# Patient Record
Sex: Female | Born: 1965
Health system: Southern US, Community
[De-identification: ages and names within clinical notes are randomized; demographics above are authoritative.]

## PROBLEM LIST (undated history)

## (undated) DIAGNOSIS — H8102 Meniere's disease, left ear: Secondary | ICD-10-CM

## (undated) DIAGNOSIS — T7840XA Allergy, unspecified, initial encounter: Secondary | ICD-10-CM

## (undated) DIAGNOSIS — F32A Depression, unspecified: Secondary | ICD-10-CM

## (undated) DIAGNOSIS — F329 Major depressive disorder, single episode, unspecified: Secondary | ICD-10-CM

## (undated) DIAGNOSIS — I10 Essential (primary) hypertension: Secondary | ICD-10-CM

## (undated) DIAGNOSIS — M1990 Unspecified osteoarthritis, unspecified site: Secondary | ICD-10-CM

## (undated) DIAGNOSIS — L4050 Arthropathic psoriasis, unspecified: Secondary | ICD-10-CM

## (undated) DIAGNOSIS — F419 Anxiety disorder, unspecified: Secondary | ICD-10-CM

## (undated) DIAGNOSIS — A159 Respiratory tuberculosis unspecified: Secondary | ICD-10-CM

## (undated) HISTORY — DX: Respiratory tuberculosis unspecified: A15.9

## (undated) HISTORY — DX: Anxiety disorder, unspecified: F41.9

## (undated) HISTORY — DX: Major depressive disorder, single episode, unspecified: F32.9

## (undated) HISTORY — DX: Essential (primary) hypertension: I10

## (undated) HISTORY — DX: Unspecified osteoarthritis, unspecified site: M19.90

## (undated) HISTORY — DX: Arthropathic psoriasis, unspecified: L40.50

## (undated) HISTORY — DX: Allergy, unspecified, initial encounter: T78.40XA

## (undated) HISTORY — PX: COLONOSCOPY: SHX174

## (undated) HISTORY — DX: Meniere's disease, left ear: H81.02

## (undated) HISTORY — DX: Depression, unspecified: F32.A

---

## 1998-02-20 DIAGNOSIS — F909 Attention-deficit hyperactivity disorder, unspecified type: Secondary | ICD-10-CM

## 1998-02-20 HISTORY — DX: Attention-deficit hyperactivity disorder, unspecified type: F90.9

## 1999-02-24 ENCOUNTER — Other Ambulatory Visit: Admission: RE | Admit: 1999-02-24 | Discharge: 1999-02-24 | Payer: Self-pay | Admitting: Obstetrics and Gynecology

## 2000-03-29 ENCOUNTER — Other Ambulatory Visit: Admission: RE | Admit: 2000-03-29 | Discharge: 2000-03-29 | Payer: Self-pay | Admitting: Obstetrics and Gynecology

## 2000-06-06 ENCOUNTER — Encounter: Admission: RE | Admit: 2000-06-06 | Discharge: 2000-06-06 | Payer: Self-pay | Admitting: Occupational Medicine

## 2000-06-06 ENCOUNTER — Encounter: Payer: Self-pay | Admitting: Occupational Medicine

## 2000-08-30 ENCOUNTER — Emergency Department (HOSPITAL_COMMUNITY): Admission: EM | Admit: 2000-08-30 | Discharge: 2000-08-31 | Payer: Self-pay | Admitting: Emergency Medicine

## 2001-04-01 ENCOUNTER — Other Ambulatory Visit: Admission: RE | Admit: 2001-04-01 | Discharge: 2001-04-01 | Payer: Self-pay | Admitting: Obstetrics and Gynecology

## 2002-06-11 ENCOUNTER — Other Ambulatory Visit: Admission: RE | Admit: 2002-06-11 | Discharge: 2002-06-11 | Payer: Self-pay | Admitting: Obstetrics and Gynecology

## 2006-10-03 ENCOUNTER — Ambulatory Visit (HOSPITAL_COMMUNITY): Admission: RE | Admit: 2006-10-03 | Discharge: 2006-10-03 | Payer: Self-pay | Admitting: Obstetrics and Gynecology

## 2006-10-03 ENCOUNTER — Encounter (INDEPENDENT_AMBULATORY_CARE_PROVIDER_SITE_OTHER): Payer: Self-pay | Admitting: Obstetrics and Gynecology

## 2010-07-05 NOTE — Op Note (Signed)
NAMEHARLEAN, REGULA                ACCOUNT NO.:  [id number]   MEDICAL RECORD NO.:  [id number]          PATIENT TYPE:  AMB   LOCATION:  SDC                           FACILITY:  [facility]   PHYSICIAN:  Sherry A. Dickstein, M.D.DATE OF BIRTH:  September 11, 1965   DATE OF PROCEDURE:  10/03/2006  DATE OF DISCHARGE:                               OPERATIVE REPORT   PREOPERATIVE DIAGNOSES:  1. Menorrhagia.  2. Endometrial polyp.   POSTOPERATIVE DIAGNOSES:  1. Menorrhagia.  2. Endometrial polyp.   PROCEDURE:  1. Dilatation and curettage.  2. Hysteroscopy with resectoscope.   SURGEON:  Sherry A. Rosalio Macadamia, M.D.   ANESTHESIA:  General.   INDICATIONS FOR PROCEDURE:  This is a 45 year old G2, P1, 0, 1, 1, 1  woman who is having menstrual cycles every 28 days lasting 6 days with  excessively heavy flow.  The patient has had an ultrasound followed by a  sonohysterogram which revealed thickened endometrium on the posterior  wall of the uterus with a probable endometrial polyp.  Because of this,  the patient was brought to the operating room for [facility] hysteroscopy with  resectoscope.   FINDINGS:  Normal size anteflexed uterus, no adnexal mass, small  endometrial polyp with thickened endometrium.   PROCEDURE IN DETAIL:  The patient was brought into the operating room  and given adequate general anesthesia.  She was placed in a dorsal  lithotomy position.  The peroneum was washed with Betadine, the vagina  was washed with Betadine.  Pelvic examination was performed.  Surgeon's  gown and gloves were changed.  The patient was draped in a sterile  fashion.  The speculum was placed within the vagina.  Paracervical block  was administered with 1% Nesacaine.  The anterior lip of the cervix was  grasped with a single tooth tenaculum.  The cervix was sound.  The  cervix was dilated with a Pratt dilators to a #31.  Hysteroscope was  introduced into the endometrial cavity.  Pictures were obtained using a  single  Loup right angle resector.  The thickened endometrium on the  posterior wall of the uterus was resected.  On initial entry of the  resectoscope a polyp was seen.  This polyp was attempted to be removed  with the right angle resector, however, it seemed that it was suctioned  into the suction device. Other small polyps were attached to the  thickened tissue.  These were removed with the resections.  After the  significant thickened tissue was removed, then resections were taken in  the rest of the endometrial tissue.  Adequate hemostasis was present.  After all tissue had been removed from the endometrial cavity, pictures  were obtained.  All instruments were removed from the vagina.  The  patient was taken out of the dorsal lithotomy position.  She was  awakened.  She was moved from the operating table to a stretcher in  stable condition.   COMPLICATIONS:  None.   ESTIMATED BLOOD LOSS:  Less than 5 ml.   SPECIMENS:  1. Posterior wall thickened endometrium with polyps.  2. Remaining endometrial resections.  3. Sorbitol differential minus 85 ml.      Sherry A. Rosalio Macadamia, M.D.  Electronically Signed     SAD/MEDQ  D:  10/03/2006  T:  10/04/2006  Job:  [id number]

## 2010-12-05 LAB — CBC
HCT: 41.3
Hemoglobin: 14.1
MCHC: 34.1
MCV: 91
Platelets: 305
RBC: 4.54
RDW: 12.5
WBC: 8.2

## 2012-06-13 ENCOUNTER — Other Ambulatory Visit: Payer: Self-pay | Admitting: Family Medicine

## 2012-06-13 DIAGNOSIS — Z1231 Encounter for screening mammogram for malignant neoplasm of breast: Secondary | ICD-10-CM

## 2012-06-19 ENCOUNTER — Ambulatory Visit: Payer: BC Managed Care – PPO | Attending: Family Medicine

## 2012-06-19 DIAGNOSIS — M545 Low back pain, unspecified: Secondary | ICD-10-CM | POA: Insufficient documentation

## 2012-06-19 DIAGNOSIS — M546 Pain in thoracic spine: Secondary | ICD-10-CM | POA: Insufficient documentation

## 2012-06-19 DIAGNOSIS — IMO0001 Reserved for inherently not codable concepts without codable children: Secondary | ICD-10-CM | POA: Insufficient documentation

## 2012-06-19 DIAGNOSIS — M25579 Pain in unspecified ankle and joints of unspecified foot: Secondary | ICD-10-CM | POA: Insufficient documentation

## 2012-06-19 DIAGNOSIS — M79609 Pain in unspecified limb: Secondary | ICD-10-CM | POA: Insufficient documentation

## 2012-06-19 DIAGNOSIS — R5381 Other malaise: Secondary | ICD-10-CM | POA: Insufficient documentation

## 2012-06-25 ENCOUNTER — Other Ambulatory Visit: Payer: Self-pay | Admitting: Family Medicine

## 2012-06-25 ENCOUNTER — Ambulatory Visit
Admission: RE | Admit: 2012-06-25 | Discharge: 2012-06-25 | Disposition: A | Discharge status: Home / Self Care | Payer: BC Managed Care – PPO | Source: Ambulatory Visit | Attending: Family Medicine | Admitting: Family Medicine

## 2012-06-25 DIAGNOSIS — R928 Other abnormal and inconclusive findings on diagnostic imaging of breast: Secondary | ICD-10-CM

## 2012-06-25 DIAGNOSIS — Z1231 Encounter for screening mammogram for malignant neoplasm of breast: Secondary | ICD-10-CM

## 2012-06-26 ENCOUNTER — Ambulatory Visit: Payer: BC Managed Care – PPO | Admitting: Physical Therapy

## 2012-07-10 ENCOUNTER — Ambulatory Visit
Admission: RE | Admit: 2012-07-10 | Discharge: 2012-07-10 | Disposition: A | Discharge status: Home / Self Care | Payer: BC Managed Care – PPO | Source: Ambulatory Visit | Attending: Family Medicine | Admitting: Family Medicine

## 2012-07-10 DIAGNOSIS — R928 Other abnormal and inconclusive findings on diagnostic imaging of breast: Secondary | ICD-10-CM

## 2015-08-05 ENCOUNTER — Other Ambulatory Visit (HOSPITAL_COMMUNITY): Payer: Self-pay | Admitting: Rheumatology

## 2015-08-05 ENCOUNTER — Ambulatory Visit (HOSPITAL_COMMUNITY)
Admission: RE | Admit: 2015-08-05 | Discharge: 2015-08-05 | Disposition: A | Discharge status: Home / Self Care | Payer: Self-pay | Source: Ambulatory Visit | Attending: Rheumatology | Admitting: Rheumatology

## 2015-08-05 DIAGNOSIS — Z79899 Other long term (current) drug therapy: Secondary | ICD-10-CM | POA: Insufficient documentation

## 2015-08-05 DIAGNOSIS — T451X5A Adverse effect of antineoplastic and immunosuppressive drugs, initial encounter: Secondary | ICD-10-CM

## 2015-08-06 ENCOUNTER — Encounter: Payer: Self-pay | Admitting: Family Medicine

## 2015-08-19 ENCOUNTER — Telehealth: Payer: Self-pay

## 2015-08-19 NOTE — Telephone Encounter (Signed)
In folder 

## 2015-09-01 ENCOUNTER — Encounter: Payer: Self-pay | Admitting: Family Medicine

## 2015-09-01 ENCOUNTER — Ambulatory Visit (INDEPENDENT_AMBULATORY_CARE_PROVIDER_SITE_OTHER): Payer: 59 | Admitting: Family Medicine

## 2015-09-01 VITALS — BP 130/90 | HR 68 | Ht 66.5 in | Wt 148.2 lb

## 2015-09-01 DIAGNOSIS — F17200 Nicotine dependence, unspecified, uncomplicated: Secondary | ICD-10-CM

## 2015-09-01 DIAGNOSIS — Z7189 Other specified counseling: Secondary | ICD-10-CM | POA: Diagnosis not present

## 2015-09-01 DIAGNOSIS — G479 Sleep disorder, unspecified: Secondary | ICD-10-CM | POA: Diagnosis not present

## 2015-09-01 DIAGNOSIS — F418 Other specified anxiety disorders: Secondary | ICD-10-CM

## 2015-09-01 DIAGNOSIS — F419 Anxiety disorder, unspecified: Secondary | ICD-10-CM

## 2015-09-01 DIAGNOSIS — Z720 Tobacco use: Secondary | ICD-10-CM

## 2015-09-01 DIAGNOSIS — R7612 Nonspecific reaction to cell mediated immunity measurement of gamma interferon antigen response without active tuberculosis: Secondary | ICD-10-CM | POA: Insufficient documentation

## 2015-09-01 DIAGNOSIS — Z8619 Personal history of other infectious and parasitic diseases: Secondary | ICD-10-CM

## 2015-09-01 DIAGNOSIS — A150 Tuberculosis of lung: Secondary | ICD-10-CM

## 2015-09-01 DIAGNOSIS — Z7689 Persons encountering health services in other specified circumstances: Secondary | ICD-10-CM

## 2015-09-01 DIAGNOSIS — F32A Depression, unspecified: Secondary | ICD-10-CM

## 2015-09-01 DIAGNOSIS — A159 Respiratory tuberculosis unspecified: Secondary | ICD-10-CM

## 2015-09-01 DIAGNOSIS — F329 Major depressive disorder, single episode, unspecified: Secondary | ICD-10-CM

## 2015-09-01 MED ORDER — VALACYCLOVIR HCL 500 MG PO TABS: 500.0000 mg | ORAL_TABLET | Freq: Two times a day (BID) | ORAL | Status: DC

## 2015-09-01 NOTE — Progress Notes (Signed)
   Subjective:    Patient ID: Valerie Delgado, female    DOB: Apr 08, 1965, 50 y.o.   MRN: [id number]  HPI Chief Complaint  Patient presents with  . new pt    new pt get established.    She is new to the practice and here for to establish care. Was living in [place]. Still works there but lives in [place].  Previous medical care: with Rheumatologist and Psychiatrist.  Sleep issues, mainly staying asleep has been a problem for past 3 weeks. Wears braces on her hands for arthritis relief. Sleeps with TV on. She smokes, drinks alcohol and drinks caffeine.   She states she has latent TB. States she was being tested due to the possibly of being started on Methotrexate for Psoriatic Arthritis and was positive for TB via blood test and then had a Negative chest XR.  She started on TB med Rifampin 2 days ago at the [facility]. Will follow with health department monthly Denies fever, chills, headaches, dizziness, chest pain, DOE, cough, abdominal pain, GI or GU symptoms, LE edema, skin rash.  Last CPE: last year.   Other providers: [facility] dermatologist.   Past medical history: HTN, psoriatic arthritis, latent TB. Herpes on left buttock 1-2 episodes per year. Denies ever having lesions to genitals. No outbreak today but requests refill of Valtrex and has prescription bottle with her today.   Mental Health History: ?Bipolar. Depression, anxiety, treated for ADHD by pschyiatrist. Sees her every 6 months.   Social history: Lives with mother, works as Biochemist, clinical for [employer].  Smoking 1/2-3/4 pack per day for about 30 years, drinking alcohol occasionally, denies drug use   Health maintenance:  Mammogram: 2 years Colonoscopy: never Last Gynecological Exam: 3-4 years Last Menstrual cycle:  Pregnancies: 1 age 45 Last Dental Exam: Dr. Gerlene Burdock appt later this month.    Reviewed allergies, medications, past medical, surgical, family, and social history.    Review of Systems Pertinent  positives and negatives in the history of present illness.     Objective:   Physical Exam BP 130/90 mmHg  Pulse 68  Ht 5' 6.5" (1.689 m)  Wt 148 lb 3.2 oz (67.223 kg)  BMI 23.56 kg/m2  Alert and oriented and in no acute distress. Not otherwise examined.      Assessment & Plan:  History of herpes simplex infection - Plan: valACYclovir (VALTREX) 500 MG tablet  TB (tuberculosis)  Encounter to establish care  Anxiety and depression  Smoker  Sleep disturbance  Refilled Valtrex. Discussed returning if outbreak occurs if needed.  Continue visits to health department and strict medication adherence for TB treatment recommended.  Discussed good sleep hygiene including avoiding alcohol, nicotine, caffeine before bedtime.  Discussed stopping smoking and she is not ready.  She will continue seeing psychiatrist for anxiety and depression and continue on current medication regimen.  Follow up as needed and schedule CPE with fasting labs.  Spent at least 30 minutes with patient and at least 50% was in counseling and coordination of care.

## 2015-10-13 ENCOUNTER — Ambulatory Visit (INDEPENDENT_AMBULATORY_CARE_PROVIDER_SITE_OTHER): Payer: 59 | Admitting: Family Medicine

## 2015-10-13 ENCOUNTER — Encounter: Payer: Self-pay | Admitting: Family Medicine

## 2015-10-13 VITALS — BP 130/90 | HR 84 | Temp 97.8°F | Ht 66.0 in | Wt 147.0 lb

## 2015-10-13 DIAGNOSIS — Z720 Tobacco use: Secondary | ICD-10-CM

## 2015-10-13 DIAGNOSIS — H6692 Otitis media, unspecified, left ear: Secondary | ICD-10-CM | POA: Diagnosis not present

## 2015-10-13 MED ORDER — AMOXICILLIN 875 MG PO TABS: 875.0000 mg | ORAL_TABLET | Freq: Two times a day (BID) | ORAL | 0 refills | Status: DC

## 2015-10-13 NOTE — Progress Notes (Signed)
Chief Complaint  Patient presents with  . Tinnitus    has had a HA for a few days. This morning herear started bleeding and ringing.    She has had a headache(mild, sinus) that she attributed to heat and humidity.  She has h/o Menieres with chronic tinnitus and hearing loss in the left ear.  This morning she thought there was water in her ear after the shower--but noted that the fluid was blood, not water.  She has a left ear fullness, no significant pain different than her usual slight discomfort in the ear from her Meniere's.  She denies any significant congestion, but the humidity causes some sinus pressure.  Denies runny nose, has slight postnasal drip.  She is taking Rifampin for abnormal screen for TB prior to being put on medications to treat her psoriatic arthritis (no active TB, no cough or symptoms).  She reports never having been on medication for Meniere's, hasn't seen ENT in years (previously saw Dr. Weston Anna). She hasn't had a flare with vertigo in quite a while.  PMH, PSH, SH reviewed  Outpatient Encounter Prescriptions as of 10/13/2015  Medication Sig Note  . ALPRAZolam (XANAX) 1 MG tablet Take 1 mg by mouth 4 (four) times daily.   Marland Kitchen amphetamine-dextroamphetamine (ADDERALL) 30 MG tablet Take 30 mg by mouth 2 (two) times daily.   . DULoxetine (CYMBALTA) 60 MG capsule Take 120 mg by mouth daily.   . rifampin (RIFADIN) 300 MG capsule Take 600 mg by mouth daily.   . valACYclovir (VALTREX) 500 MG tablet Take 1 tablet (500 mg total) by mouth 2 (two) times daily. 10/13/2015: Uses prn herpes outbreaks (on buttocks)   No facility-administered encounter medications on file as of 10/13/2015.    Allergies  Allergen Reactions  . Sulfur     Makes stomach upset    ROS:  No nausea, vomiting, diarrhea (slight queasiness related to Meniere's). No fever or chills.  No purulent drainage, cough, shortness of breath, chest pain, bruising, rashes.  PHYSICAL EXAM: BP 130/90 (BP Location: Left  Arm, Patient Position: Sitting, Cuff Size: Normal)   Pulse 84   Temp 97.8 F (36.6 C) (Tympanic)   Ht 5\' 6"  (1.676 m)   Wt 147 lb (66.7 kg)   BMI 23.73 kg/m    Well appearing, pleasant female in no distress HEENT: PERRL, EOMI, conjunctiva and sclera clear. Nasal mucosa is mildly edematous with clear mucus Right TM and EAC normal Left EAC has coating of blood inferiorly in the canal.  The canal otherwise appears normal--no lesions, or inflammation, no exudate. She has no pain with movement of the external ear or pressure on tragus.  TM shows inflammation and erythema along the inferior and posterior portion, with some yellow discoloration.  Superior portion of TM appears normal. TM appears intact, no perforation visualized. No sinus tenderness Neck: no lymphadenopathy, thyromegaly or mass Heart: regular rate and rhythm without murmur Lungs: clear bilaterally Skin: normal turgor, no bruising Psych: normal mood, affect, hygiene and grooming Neuro: alert and oriented, cranial nerves intact, normal gait.   ASSESSMENT/PLAN:  Acute left otitis media, recurrence not specified, unspecified otitis media type - Plan: amoxicillin (AMOXIL) 875 MG tablet  Tobacco abuse - counseled re: risks and available assistance; encouraged cessation  F/u if persistent/worsening symptoms.  Not on treatment for Meniere's--consider if persistent/worsening symptoms.

## 2015-10-13 NOTE — Patient Instructions (Signed)
Start the antibiotics today, take twice daily for 10 days. Return if you develop fever, worsening pain, no improvement. Expect some improvement within 48 hours. Return for recheck in 1-2 weeks if any ongoing symptoms.

## 2015-12-15 ENCOUNTER — Encounter: Payer: Self-pay | Admitting: Family Medicine

## 2015-12-20 ENCOUNTER — Encounter: Payer: Self-pay | Admitting: Family Medicine

## 2015-12-20 ENCOUNTER — Ambulatory Visit (INDEPENDENT_AMBULATORY_CARE_PROVIDER_SITE_OTHER): Payer: 59 | Admitting: Family Medicine

## 2015-12-20 VITALS — BP 110/80 | HR 100 | Temp 98.4°F | Resp 20 | Wt 148.8 lb

## 2015-12-20 DIAGNOSIS — H9202 Otalgia, left ear: Secondary | ICD-10-CM | POA: Diagnosis not present

## 2015-12-20 DIAGNOSIS — H1132 Conjunctival hemorrhage, left eye: Secondary | ICD-10-CM

## 2015-12-20 NOTE — Progress Notes (Signed)
Subjective: Chief Complaint  Patient presents with  . Eye Problem    reports blood shot left eye today   . Ear Fullness    reports fullness/pressure on left side. states she has been exhausted.      Valerie Delgado is a 50 y.o. female who presents for a 1 day history of left eye redness. States she noticed this upon awakening today. Also reports mild headache, nasal congestion and left ear fullness.  Denies fever, chills, dizziness, vision changes, eye pain, foreign body sensation to eye, neck pain, chest pain, shortness of breath.   Treatment to date: sudafed.  Denies sick contacts.  No other aggravating or relieving factors.  No other c/o.  ROS as in subjective.   Objective: Vitals:   12/20/15 1331  BP: 110/80  Pulse: 100  Resp: 20  Temp: 98.4 F (36.9 C)    General appearance: Alert, WD/WN, no distress, mildly ill appearing                             Skin: warm, no rash                           Head: no sinus tenderness                            Eyes: left conjunctiva with hemorrhage to left upper lateral region, no sign of infection, left eye otherwise normal, Right conjunctiva normal. Bilateral corneas clear, PERRLA, EOMs intact.                             Ears: pearly TMs, external ear canals normal                          Nose: septum midline, turbinates swollen, with erythema and clear discharge             Mouth/throat: MMM, tongue normal, mild pharyngeal erythema                           Neck: supple, no adenopathy, no thyromegaly, nontender                          Heart: RRR, normal S1, S2, no murmurs                         Lungs: CTA bilaterally, no wheezes, rales, or rhonchi     Assessment: Subconjunctival hemorrhage of left eye  Left ear pain   Plan: Discussed diagnosis and treatment of URI and subconjunctival hemorrhage Suggested symptomatic OTC remedies for nasal congestion. Reassured her that the subconjunctival hemorrhage to her left eye is benign  and most likely from a sneeze, cough or straining. BP is not the cause. Advised that this may take up to 2 weeks to resolve.  Nasal saline spray for congestion.  Tylenol or Ibuprofen OTC for fever and malaise.  Call/return in 2-3 days if symptoms aren't resolving.

## 2015-12-20 NOTE — Patient Instructions (Signed)
Subconjunctival Hemorrhage °Subconjunctival hemorrhage is bleeding that happens between the white part of your eye (sclera) and the clear membrane that covers the outside of your eye (conjunctiva). There are many tiny blood vessels near the surface of your eye. A subconjunctival hemorrhage happens when one or more of these vessels breaks and bleeds, causing a red patch to appear on your eye. This is similar to a bruise. °Depending on the amount of bleeding, the red patch may only cover a small area of your eye or it may cover the entire visible part of the sclera. If a lot of blood collects under the conjunctiva, there may also be swelling. Subconjunctival hemorrhages do not affect your vision or cause pain, but your eye may feel irritated if there is swelling. Subconjunctival hemorrhages usually do not require treatment, and they disappear on their own within two weeks. °CAUSES °This condition may be caused by: °· Mild trauma, such as rubbing your eye too hard. °· Severe trauma or blunt injuries. °· Coughing, sneezing, or vomiting. °· Straining, such as when lifting a heavy object. °· High blood pressure. °· Recent eye surgery. °· A history of diabetes. °· Certain medicines, especially blood thinners (anticoagulants). °· Other conditions, such as eye tumors, bleeding disorders, or blood vessel abnormalities. °Subconjunctival hemorrhages can happen without an obvious cause.  °SYMPTOMS  °Symptoms of this condition include: °· A bright red or dark red patch on the white part of the eye. °¨ The red area may spread out to cover a larger area of the eye before it goes away. °¨ The red area may turn brownish-yellow before it goes away. °· Swelling. °· Mild eye irritation. °DIAGNOSIS °This condition is diagnosed with a physical exam. If your subconjunctival hemorrhage was caused by trauma, your health care provider may refer you to an eye specialist (ophthalmologist) or another specialist to check for other injuries. You  may have other tests, including: °· An eye exam. °· A blood pressure check. °· Blood tests to check for bleeding disorders. °If your subconjunctival hemorrhage was caused by trauma, X-rays or a CT scan may be done to check for other injuries. °TREATMENT °Usually, no treatment is needed. Your health care provider may recommend eye drops or cold compresses to help with discomfort. °HOME CARE INSTRUCTIONS °· Take over-the-counter and prescription medicines only as directed by your health care provider. °· Use eye drops or cold compresses to help with discomfort as directed by your health care provider. °· Avoid activities, things, and environments that may irritate or injure your eye. °· Keep all follow-up visits as told by your health care provider. This is important. °SEEK MEDICAL CARE IF: °· You have pain in your eye. °· The bleeding does not go away within 3 weeks. °· You keep getting new subconjunctival hemorrhages. °SEEK IMMEDIATE MEDICAL CARE IF: °· Your vision changes or you have difficulty seeing. °· You suddenly develop severe sensitivity to light. °· You develop a severe headache, persistent vomiting, confusion, or abnormal tiredness (lethargy). °· Your eye seems to bulge or protrude from your eye socket. °· You develop unexplained bruises on your body. °· You have unexplained bleeding in another area of your body. °  °This information is not intended to replace advice given to you by your health care provider. Make sure you discuss any questions you have with your health care provider. °  °Document Released: 02/06/2005 Document Revised: 10/28/2014 Document Reviewed: 04/15/2014 °[employer] Interactive Patient Education ©2016 [employer] ° °

## 2016-01-05 ENCOUNTER — Encounter: Payer: Self-pay | Admitting: Family Medicine

## 2016-01-11 ENCOUNTER — Ambulatory Visit (INDEPENDENT_AMBULATORY_CARE_PROVIDER_SITE_OTHER): Payer: 59 | Admitting: Family Medicine

## 2016-01-11 ENCOUNTER — Encounter: Payer: Self-pay | Admitting: Family Medicine

## 2016-01-11 ENCOUNTER — Other Ambulatory Visit (HOSPITAL_COMMUNITY)
Admission: RE | Admit: 2016-01-11 | Discharge: 2016-01-11 | Disposition: A | Discharge status: Home / Self Care | Payer: 59 | Source: Ambulatory Visit | Attending: Family Medicine | Admitting: Family Medicine

## 2016-01-11 VITALS — BP 140/90 | HR 96 | Ht 66.0 in | Wt 148.0 lb

## 2016-01-11 DIAGNOSIS — F17200 Nicotine dependence, unspecified, uncomplicated: Secondary | ICD-10-CM

## 2016-01-11 DIAGNOSIS — Z1239 Encounter for other screening for malignant neoplasm of breast: Secondary | ICD-10-CM

## 2016-01-11 DIAGNOSIS — Z124 Encounter for screening for malignant neoplasm of cervix: Secondary | ICD-10-CM

## 2016-01-11 DIAGNOSIS — Z13220 Encounter for screening for lipoid disorders: Secondary | ICD-10-CM

## 2016-01-11 DIAGNOSIS — L608 Other nail disorders: Secondary | ICD-10-CM | POA: Diagnosis not present

## 2016-01-11 DIAGNOSIS — Z1151 Encounter for screening for human papillomavirus (HPV): Secondary | ICD-10-CM | POA: Diagnosis present

## 2016-01-11 DIAGNOSIS — Z0000 Encounter for general adult medical examination without abnormal findings: Secondary | ICD-10-CM | POA: Diagnosis not present

## 2016-01-11 DIAGNOSIS — R809 Proteinuria, unspecified: Secondary | ICD-10-CM

## 2016-01-11 DIAGNOSIS — Z1231 Encounter for screening mammogram for malignant neoplasm of breast: Secondary | ICD-10-CM | POA: Diagnosis not present

## 2016-01-11 DIAGNOSIS — Z113 Encounter for screening for infections with a predominantly sexual mode of transmission: Secondary | ICD-10-CM | POA: Insufficient documentation

## 2016-01-11 DIAGNOSIS — N941 Unspecified dyspareunia: Secondary | ICD-10-CM

## 2016-01-11 DIAGNOSIS — Z1211 Encounter for screening for malignant neoplasm of colon: Secondary | ICD-10-CM | POA: Diagnosis not present

## 2016-01-11 DIAGNOSIS — Z23 Encounter for immunization: Secondary | ICD-10-CM

## 2016-01-11 DIAGNOSIS — Z01419 Encounter for gynecological examination (general) (routine) without abnormal findings: Secondary | ICD-10-CM | POA: Diagnosis present

## 2016-01-11 LAB — LIPID PANEL
CHOL/HDL RATIO: 2.7 ratio (ref <5.0)
Cholesterol: 208 mg/dL — ABNORMAL HIGH (ref <200)
HDL: 77 mg/dL (ref >50)
LDL CALC: 121 mg/dL — AB (ref <100)
TRIGLYCERIDES: 49 mg/dL (ref <150)
VLDL: 10 mg/dL (ref <30)

## 2016-01-11 LAB — CBC WITH DIFFERENTIAL/PLATELET
Basophils Absolute: 0 cells/uL (ref 0–200)
Basophils Relative: 0 %
EOS ABS: 85 {cells}/uL (ref 15–500)
EOS PCT: 1 %
HCT: 41 % (ref 35.0–45.0)
HEMOGLOBIN: 13.5 g/dL (ref 11.7–15.5)
Lymphocytes Relative: 33 %
Lymphs Abs: 2805 cells/uL (ref 850–3900)
MCH: 30.5 pg (ref 27.0–33.0)
MCHC: 32.9 g/dL (ref 32.0–36.0)
MCV: 92.8 fL (ref 80.0–100.0)
MONO ABS: 765 {cells}/uL (ref 200–950)
MONOS PCT: 9 %
MPV: 10.8 fL (ref 7.5–12.5)
NEUTROS ABS: 4845 {cells}/uL (ref 1500–7800)
NEUTROS PCT: 57 %
Platelets: 331 10*3/uL (ref 140–400)
RBC: 4.42 MIL/uL (ref 3.80–5.10)
RDW: 13.4 % (ref 11.0–15.0)
WBC: 8.5 10*3/uL (ref 4.0–10.5)

## 2016-01-11 LAB — POCT URINALYSIS DIPSTICK
Bilirubin, UA: NEGATIVE
Glucose, UA: NEGATIVE
Ketones, UA: NEGATIVE
Leukocytes, UA: NEGATIVE
NITRITE UA: NEGATIVE
PH UA: 6
RBC UA: NEGATIVE
UROBILINOGEN UA: NEGATIVE

## 2016-01-11 LAB — COMPREHENSIVE METABOLIC PANEL
ALBUMIN: 4.1 g/dL (ref 3.6–5.1)
ALT: 17 U/L (ref 6–29)
AST: 18 U/L (ref 10–35)
Alkaline Phosphatase: 63 U/L (ref 33–130)
BUN: 11 mg/dL (ref 7–25)
CHLORIDE: 104 mmol/L (ref 98–110)
CO2: 27 mmol/L (ref 20–31)
CREATININE: 0.69 mg/dL (ref 0.50–1.05)
Calcium: 9.2 mg/dL (ref 8.6–10.4)
Glucose, Bld: 89 mg/dL (ref 65–99)
Potassium: 4.4 mmol/L (ref 3.5–5.3)
SODIUM: 138 mmol/L (ref 135–146)
Total Bilirubin: 0.4 mg/dL (ref 0.2–1.2)
Total Protein: 6.5 g/dL (ref 6.1–8.1)

## 2016-01-11 NOTE — Patient Instructions (Addendum)
Call and schedule an appointment with an eye doctor. Check with your insurance regarding an eye doctor and a dermatologist. You can also check and see about a podiatrist if you would like. You will receive a phone call from the gastroenterologist office to schedule an appointment to discuss your colonoscopy. Call and schedule your mammogram appointment. The order will be in the computer.   Give Dr. Dora Sims a call to schedule a follow-up appointment regarding possible treatment for psoriatic arthritis.  Preventative Care for Adults - Female      MAINTAIN REGULAR HEALTH EXAMS:  A routine yearly physical is a good way to check in with your primary care provider about your health and preventive screening. It is also an opportunity to share updates about your health and any concerns you have, and receive a thorough all-over exam.   Most health insurance companies pay for at least some preventative services.  Check with your health plan for specific coverages.  WHAT PREVENTATIVE SERVICES DO WOMEN NEED?  Adult women should have their weight and blood pressure checked regularly.   Women age 80 and older should have their cholesterol levels checked regularly.  Women should be screened for cervical cancer with a Pap smear and pelvic exam beginning at either age 34, or 3 years after they become sexually activity.    Breast cancer screening generally begins at age 71 with a mammogram and breast exam by your primary care provider.    Beginning at age 17 and continuing to age 21, women should be screened for colorectal cancer.  Certain people may need continued testing until age 67.  Updating vaccinations is part of preventative care.  Vaccinations help protect against diseases such as the flu.  Osteoporosis is a disease in which the bones lose minerals and strength as we age. Women ages 29 and over should discuss this with their caregivers, as should women after menopause who have other risk  factors.  Lab tests are generally done as part of preventative care to screen for anemia and blood disorders, to screen for problems with the kidneys and liver, to screen for bladder problems, to check blood sugar, and to check your cholesterol level.  Preventative services generally include counseling about diet, exercise, avoiding tobacco, drugs, excessive alcohol consumption, and sexually transmitted infections.    GENERAL RECOMMENDATIONS FOR GOOD HEALTH:  Healthy diet:  Eat a variety of foods, including fruit, vegetables, animal or vegetable protein, such as meat, fish, chicken, and eggs, or beans, lentils, tofu, and grains, such as rice.  Drink plenty of water daily.  Decrease saturated fat in the diet, avoid lots of red meat, processed foods, sweets, fast foods, and fried foods.  Exercise:  Aerobic exercise helps maintain good heart health. At least 30-40 minutes of moderate-intensity exercise is recommended. For example, a brisk walk that increases your heart rate and breathing. This should be done on most days of the week.   Find a type of exercise or a variety of exercises that you enjoy so that it becomes a part of your daily life.  Examples are running, walking, swimming, water aerobics, and biking.  For motivation and support, explore group exercise such as aerobic class, spin class, Zumba, Yoga,or  martial arts, etc.    Set exercise goals for yourself, such as a certain weight goal, walk or run in a race such as a 5k walk/run.  Speak to your primary care provider about exercise goals.  Disease prevention:  If you smoke or chew  tobacco, find out from your caregiver how to quit. It can literally save your life, no matter how long you have been a tobacco user. If you do not use tobacco, never begin.   Maintain a healthy diet and normal weight. Increased weight leads to problems with blood pressure and diabetes.   The Body Mass Index or BMI is a way of measuring how much of  your body is fat. Having a BMI above 27 increases the risk of heart disease, diabetes, hypertension, stroke and other problems related to obesity. Your caregiver can help determine your BMI and based on it develop an exercise and dietary program to help you achieve or maintain this important measurement at a healthful level.  High blood pressure causes heart and blood vessel problems.  Persistent high blood pressure should be treated with medicine if weight loss and exercise do not work.   Fat and cholesterol leaves deposits in your arteries that can block them. This causes heart disease and vessel disease elsewhere in your body.  If your cholesterol is found to be high, or if you have heart disease or certain other medical conditions, then you may need to have your cholesterol monitored frequently and be treated with medication.   Ask if you should have a cardiac stress test if your history suggests this. A stress test is a test done on a treadmill that looks for heart disease. This test can find disease prior to there being a problem.  Menopause can be associated with physical symptoms and risks. Hormone replacement therapy is available to decrease these. You should talk to your caregiver about whether starting or continuing to take hormones is right for you.   Osteoporosis is a disease in which the bones lose minerals and strength as we age. This can result in serious bone fractures. Risk of osteoporosis can be identified using a bone density scan. Women ages 69 and over should discuss this with their caregivers, as should women after menopause who have other risk factors. Ask your caregiver whether you should be taking a calcium supplement and Vitamin D, to reduce the rate of osteoporosis.   Avoid drinking alcohol in excess (more than two drinks per day).  Avoid use of street drugs. Do not share needles with anyone. Ask for professional help if you need assistance or instructions on stopping the use  of alcohol, cigarettes, and/or drugs.  Brush your teeth twice a day with fluoride toothpaste, and floss once a day. Good oral hygiene prevents tooth decay and gum disease. The problems can be painful, unattractive, and can cause other health problems. Visit your dentist for a routine oral and dental check up and preventive care every 6-12 months.   Look at your skin regularly.  Use a mirror to look at your back. Notify your caregivers of changes in moles, especially if there are changes in shapes, colors, a size larger than a pencil eraser, an irregular border, or development of new moles.  Safety:  Use seatbelts 100% of the time, whether driving or as a passenger.  Use safety devices such as hearing protection if you work in environments with loud noise or significant background noise.  Use safety glasses when doing any work that could send debris in to the eyes.  Use a helmet if you ride a bike or motorcycle.  Use appropriate safety gear for contact sports.  Talk to your caregiver about gun safety.  Use sunscreen with a SPF (or skin protection factor) of 15 or  greater.  Lighter skinned people are at a greater risk of skin cancer. Don't forget to also wear sunglasses in order to protect your eyes from too much damaging sunlight. Damaging sunlight can accelerate cataract formation.   Practice safe sex. Use condoms. Condoms are used for birth control and to help reduce the spread of sexually transmitted infections (or STIs).  Some of the STIs are gonorrhea (the clap), chlamydia, syphilis, trichomonas, herpes, HPV (human papilloma virus) and HIV (human immunodeficiency virus) which causes AIDS. The herpes, HIV and HPV are viral illnesses that have no cure. These can result in disability, cancer and death.   Keep carbon monoxide and smoke detectors in your home functioning at all times. Change the batteries every 6 months or use a model that plugs into the wall.   Vaccinations:  Stay up to date with  your tetanus shots and other required immunizations. You should have a booster for tetanus every 10 years. Be sure to get your flu shot every year, since 5%-20% of the [place] population comes down with the flu. The flu vaccine changes each year, so being vaccinated once is not enough. Get your shot in the fall, before the flu season peaks.   Other vaccines to consider:  Human Papilloma Virus or HPV causes cancer of the cervix, and other infections that can be transmitted from person to person. There is a vaccine for HPV, and females should get immunized between the ages of 61 and 63. It requires a series of 3 shots.   Pneumococcal vaccine to protect against certain types of pneumonia.  This is normally recommended for adults age 70 or older.  However, adults younger than 52 years old with certain underlying conditions such as diabetes, heart or lung disease should also receive the vaccine.  Shingles vaccine to protect against Varicella Zoster if you are older than age 8, or younger than 52 years old with certain underlying illness.  Hepatitis A vaccine to protect against a form of infection of the liver by a virus acquired from food.  Hepatitis B vaccine to protect against a form of infection of the liver by a virus acquired from blood or body fluids, particularly if you work in health care.  If you plan to travel internationally, check with your local health department for specific vaccination recommendations.  Cancer Screening:  Breast cancer screening is essential to preventive care for women. All women age 31 and older should perform a breast self-exam every month. At age 28 and older, women should have their caregiver complete a breast exam each year. Women at ages 68 and older should have a mammogram (x-ray film) of the breasts. Your caregiver can discuss how often you need mammograms.    Cervical cancer screening includes taking a Pap smear (sample of cells examined under a microscope) from  the cervix (end of the uterus). It also includes testing for HPV (Human Papilloma Virus, which can cause cervical cancer). Screening and a pelvic exam should begin at age 54, or 3 years after a woman becomes sexually active. Screening should occur every year, with a Pap smear but no HPV testing, up to age 10. After age 16, you should have a Pap smear every 3 years with HPV testing, if no HPV was found previously.   Most routine colon cancer screening begins at the age of 27. On a yearly basis, doctors may provide special easy to use take-home tests to check for hidden blood in the stool. Sigmoidoscopy or colonoscopy can  detect the earliest forms of colon cancer and is life saving. These tests use a small camera at the end of a tube to directly examine the colon. Speak to your caregiver about this at age 63, when routine screening begins (and is repeated every 5 years unless early forms of pre-cancerous polyps or small growths are found).

## 2016-01-11 NOTE — Progress Notes (Signed)
Subjective:    Patient ID: Valerie Delgado, female    DOB: 04/30/1965, 50 y.o.   MRN: [id number]  HPI Chief Complaint  Patient presents with  . fasting cpe    fasting cpe, pap,dry eyes, flu shot will be given today, sex is painful   She is here for a complete physical exam. Her only concern today is that she is having some pain with intercourse. States her skin feels dry and even with lubrication she still has discomfort. Denies history of vaginal atrophy or dryness. States her last gynecological exam was 4 years ago. She would like to be tested for STDs but states she has been tested fairly recently for HIV and does not want this test.  Last CPE: last year  Other providers: [facility] dermatologist in [place]. but plans to find a new one in [place]. Dr. Toy Care -psychiatrist. Dr. Estanislado Pandy - rheumatologist.  She was treated for latent TB at the [facility]. States she completed treatment last week. Has not followed up with Dr. Estanislado Pandy at of yet for Psoriatic arthritis.  Social history: works as Publishing copy for [employer] Diet: nothing particular Excerise: not currently.   Immunizations: flu shot- wants this today. Tdap July 2016.   Health maintenance:  Mammogram: 3 years ago.  Colonoscopy: 2000, was having problems.  Last Gynecological Exam: 4 years ago. OB/GYN in [place] [facility]. Denies history of abnormal pap smear.  Last Menstrual cycle: years ago. Was taking Depo-Provera injections. Stopped 4 years ago and no periods since.  Pregnancies: 2. 1 live birth.  Last Dental Exam: July 2017. Twice annually  Last Eye Exam: never.   Wears seatbelt always, uses sunscreen, smoke detectors in home and functioning, does not text while driving and feels safe in home environment.   Reviewed allergies, medications, past medical, surgical, family, and social history.    Review of Systems Review of Systems Constitutional: -fever, -chills, -sweats, -unexpected weight change,-fatigue ENT: -runny  nose, -ear pain, -sore throat Cardiology:  -chest pain, -palpitations, -edema Respiratory: -cough, -shortness of breath, -wheezing Gastroenterology: -abdominal pain, -nausea, -vomiting, -diarrhea, -constipation  Hematology: -bleeding or bruising problems Musculoskeletal: -arthralgias, -myalgias, -joint swelling, -back pain Ophthalmology: -vision changes Urology: -dysuria, -difficulty urinating, -hematuria, -urinary frequency, -urgency Neurology: -headache, -weakness, -tingling, -numbness       Objective:   Physical Exam BP 140/90   Pulse 96   Ht 5\' 6"  (1.676 m)   Wt 148 lb (67.1 kg)   BMI 23.89 kg/m   General Appearance:    Alert, cooperative, no distress, appears stated age  Head:    Normocephalic, without obvious abnormality, atraumatic  Eyes:    PERRL, conjunctiva/corneas clear, EOM's intact, fundi    benign  Ears:    Normal TM's and external ear canals  Nose:   Nares normal, mucosa normal, no drainage or sinus   tenderness  Throat:   Lips, mucosa, and tongue normal; teeth and gums normal  Neck:   Supple, no lymphadenopathy;  thyroid:  no   enlargement/tenderness/nodules; no carotid   bruit or JVD  Back:    Spine nontender, no curvature, ROM normal, no CVA     tenderness  Lungs:     Clear to auscultation bilaterally without wheezes, rales or     ronchi; respirations unlabored  Chest Wall:    No tenderness or deformity   Heart:    Regular rate and rhythm, S1 and S2 normal, no murmur, rub   or gallop  Breast Exam:    No tenderness,  masses, or nipple discharge or inversion.      No axillary lymphadenopathy  Abdomen:     Soft, non-tender, nondistended, normoactive bowel sounds,    no masses, no hepatosplenomegaly  Genitalia:    Normal external genitalia without lesions.  BUS and vagina normal; cervix without lesions, or cervical motion tenderness. No abnormal vaginal discharge.  Uterus and adnexa not enlarged, nontender, no masses.  Pap performed and chaperone present.  Rectal:     Refused.   Extremities:   No clubbing, cyanosis or edema  Pulses:   2+ and symmetric all extremities  Skin:   Skin color, texture, turgor normal, no rashes or lesions. Multiple toenails with discoloration and abnormally thin nails with some jagged edges.  Lymph nodes:   Cervical, supraclavicular, and axillary nodes normal  Neurologic:   CNII-XII intact, normal strength, sensation and gait; reflexes 2+ and symmetric throughout          Psych:   Normal mood, affect, hygiene and grooming.    Urinalysis dipstick: trace of protein. Otherwise negative.       Assessment & Plan:  Routine general medical examination at a health care facility - Plan: Urinalysis Dipstick, CBC with Differential/Platelet, Comprehensive metabolic panel, Lipid panel  Need for prophylactic vaccination and inoculation against influenza - Plan: Flu Vaccine QUAD 36+ mos IM  Screening for breast cancer - Plan: MM DIGITAL SCREENING BILATERAL  Dyspareunia, female  Special screening for malignant neoplasms, colon - Plan: Ambulatory referral to Gastroenterology  Screening for lipid disorders - Plan: Lipid panel  Screening for cervical cancer - Plan: Cytology - PAP  Toenail deformity - Plan: Ambulatory referral to Podiatry  Smoker  Proteinuria, unspecified type - Plan: POCT urinalysis dipstick  Reviewed labs from June 2017 that were done at Dr. Arlean Hopping office. Discussed that cholesterol was not checked and will order labs today including fasting lipids. Labs were scanned into her chart.  I recommend that she eat a healthy well balanced diet and get at least 150 minutes of physical activity per week. She is not currently exercising. Discussed recommendation of no more than 1 alcoholic beverage per day for female. Dyspareunia - mild vaginal dryness and discussed trying olive oil for this. She will let me know if this is not improving issue. Smoking cessation discussed. She is contemplating stopping.  Discussed that  she does have a trace of protein in her urine which is abnormal and I would like for her to return in 2-3 weeks for a repeat urinalysis. This is just going to be a lab visit. Mammogram ordered. Referral made to GI for screening colonoscopy. Patient is in agreement to do this. Pap smear performed and STD screening added to this per patient request. Chaperone present. Referral made to podiatry per patient request to discuss toenail deformity. She is requesting oral Lamisil however she does not appear to have a severe case of toenail fungus. Plan to follow up pending labs or in 1 year. Lab visit in 2-3 weeks for repeat urinalysis due to trace of protein.

## 2016-01-12 ENCOUNTER — Encounter: Payer: Self-pay | Admitting: Internal Medicine

## 2016-01-12 LAB — CYTOLOGY - PAP
Chlamydia: NEGATIVE
DIAGNOSIS: NEGATIVE
HPV: NOT DETECTED
Neisseria Gonorrhea: NEGATIVE
Trichomonas: NEGATIVE

## 2016-01-28 ENCOUNTER — Ambulatory Visit (INDEPENDENT_AMBULATORY_CARE_PROVIDER_SITE_OTHER): Payer: 59 | Admitting: Podiatry

## 2016-01-28 VITALS — BP 134/83 | HR 78

## 2016-01-28 DIAGNOSIS — B351 Tinea unguium: Secondary | ICD-10-CM | POA: Diagnosis not present

## 2016-01-28 MED ORDER — TERBINAFINE HCL 250 MG PO TABS: 250.0000 mg | ORAL_TABLET | Freq: Every day | ORAL | 0 refills | Status: DC

## 2016-01-28 NOTE — Progress Notes (Signed)
   Subjective:    Patient ID: Valerie Delgado, female    DOB: 01-11-1966, 50 y.o.   MRN: [id number]  HPI    Review of Systems  All other systems reviewed and are negative.      Objective:   Physical Exam        Assessment & Plan:

## 2016-01-30 NOTE — Progress Notes (Signed)
Subjective:     Patient ID: Valerie Delgado, female   DOB: 09/18/1965, 49 y.o.   MRN: [id number]  HPI patient presents with multiple discolored nailbeds bilateral that are bothersome to her and states it's been getting worse recently   Review of Systems  All other systems reviewed and are negative.      Objective:   Physical Exam  Constitutional: She is oriented to person, place, and time.  Cardiovascular: Intact distal pulses.   Musculoskeletal: Normal range of motion.  Neurological: She is oriented to person, place, and time.  Skin: Skin is warm.  Nursing note and vitals reviewed.  neurovascular status intact muscle strength adequate range of motion within normal limits with patient found to have numerous nails with slight discoloration and especially around the hallux. It is localized in nature and there is no proximal edema erythema or drainage noted     Assessment:     Mycotic nail infections with probable trauma also present    Plan:     H&P conditions reviewed and at this point I recommended oral treatment with liver function studies which will be obtained along with topical and laser therapy. I explained the risk of oral treatment and she is willing to accept risk and is written prescription for Lamisil to be taken daily for 90 days will initiate laser treatment

## 2016-02-01 ENCOUNTER — Ambulatory Visit
Admission: RE | Admit: 2016-02-01 | Discharge: 2016-02-01 | Disposition: A | Discharge status: Home / Self Care | Payer: 59 | Source: Ambulatory Visit | Attending: Family Medicine | Admitting: Family Medicine

## 2016-02-01 DIAGNOSIS — Z1239 Encounter for other screening for malignant neoplasm of breast: Secondary | ICD-10-CM

## 2016-03-03 ENCOUNTER — Other Ambulatory Visit: Payer: 59

## 2016-03-13 ENCOUNTER — Encounter: Payer: Self-pay | Admitting: Family Medicine

## 2016-05-08 ENCOUNTER — Other Ambulatory Visit (INDEPENDENT_AMBULATORY_CARE_PROVIDER_SITE_OTHER): Payer: 59

## 2016-05-08 ENCOUNTER — Other Ambulatory Visit: Payer: Self-pay | Admitting: Family Medicine

## 2016-05-08 DIAGNOSIS — Z8619 Personal history of other infectious and parasitic diseases: Secondary | ICD-10-CM

## 2016-05-08 DIAGNOSIS — N941 Unspecified dyspareunia: Secondary | ICD-10-CM | POA: Diagnosis not present

## 2016-05-08 LAB — POCT URINALYSIS DIPSTICK
Bilirubin, UA: NEGATIVE
Glucose, UA: NEGATIVE
Ketones, UA: NEGATIVE
Leukocytes, UA: NEGATIVE
Nitrite, UA: NEGATIVE
PROTEIN UA: NEGATIVE
RBC UA: NEGATIVE
SPEC GRAV UA: 1.025 (ref 1.030–1.035)
UROBILINOGEN UA: NEGATIVE (ref ?–2.0)
pH, UA: 6 (ref 5.0–8.0)

## 2016-05-08 NOTE — Telephone Encounter (Signed)
ok 

## 2016-05-08 NOTE — Telephone Encounter (Signed)
Is this okay to refill? 

## 2016-05-10 ENCOUNTER — Ambulatory Visit (INDEPENDENT_AMBULATORY_CARE_PROVIDER_SITE_OTHER): Payer: 59 | Admitting: Family Medicine

## 2016-05-10 ENCOUNTER — Encounter: Payer: Self-pay | Admitting: Family Medicine

## 2016-05-10 VITALS — BP 110/70 | HR 74 | Temp 98.2°F | Resp 16 | Wt 147.0 lb

## 2016-05-10 DIAGNOSIS — R059 Cough, unspecified: Secondary | ICD-10-CM

## 2016-05-10 DIAGNOSIS — R05 Cough: Secondary | ICD-10-CM

## 2016-05-10 DIAGNOSIS — J069 Acute upper respiratory infection, unspecified: Secondary | ICD-10-CM

## 2016-05-10 DIAGNOSIS — F17200 Nicotine dependence, unspecified, uncomplicated: Secondary | ICD-10-CM

## 2016-05-10 MED ORDER — AZITHROMYCIN 250 MG PO TABS: ORAL_TABLET | ORAL | 0 refills | Status: DC

## 2016-05-10 NOTE — Progress Notes (Signed)
Subjective: Chief Complaint  Patient presents with  . cough    cough- alittle greenish, yellow mucous, sinus drainage    Valerie Delgado is a 51 y.o. female who presents for a 10 day history cough, congestion and post nasal drainage.   She used teledoc through her work and was prescribed Amoxicillin for sinus pain and sore throat. Finished this 2 weeks ago and sinus symptoms resolved.   Denies fever, chills, body aches, ear pain, chest pain, shortness of breath, wheezing, abdominal pain, N/V/D.   Smokes.  History of TB and finished treatment in November 2017.   Treatment to date: antibiotics and flonase, antihistamine.  Denies sick contacts.  No other aggravating or relieving factors.  No other c/o.  ROS as in subjective.   Objective: Vitals:   05/10/16 1335  BP: 110/70  Pulse: 74  Resp: 16  Temp: 98.2 F (36.8 C)    General appearance: Alert, WD/WN, no distress, mildly ill appearing                             Skin: warm, no rash                           Head: no sinus tenderness                            Eyes: conjunctiva normal, corneas clear, PERRLA                            Ears: pearly TMs, external ear canals normal                          Nose: septum midline, turbinates swollen, with erythema and clear discharge             Mouth/throat: MMM, tongue normal, mild pharyngeal erythema                           Neck: supple, no adenopathy, no thyromegaly, nontender                          Heart: RRR, normal S1, S2, no murmurs                         Lungs: CTA bilaterally, no wheezes, rales, or rhonchi      Assessment: Acute URI - Plan: azithromycin (ZITHROMAX Z-PAK) 250 MG tablet  Cough - Plan: azithromycin (ZITHROMAX Z-PAK) 250 MG tablet  Smoker   Plan: Discussed diagnosis and treatment of cough. Z-pak sent to pharmacy. Counseled on stopping smoking.  Suggested symptomatic OTC remedies.Nasal saline spray for congestion. Advised to continue treating allergy  symptoms. Tylenol or Ibuprofen OTC for fever and malaise.  Call/return if not back to baseline after finishing the antibiotic.

## 2016-05-11 ENCOUNTER — Encounter: Payer: Self-pay | Admitting: Gastroenterology

## 2016-07-05 ENCOUNTER — Encounter: Payer: 59 | Admitting: Gastroenterology

## 2016-07-28 ENCOUNTER — Encounter: Payer: Self-pay | Admitting: Family Medicine

## 2016-07-28 ENCOUNTER — Ambulatory Visit (INDEPENDENT_AMBULATORY_CARE_PROVIDER_SITE_OTHER): Payer: Self-pay | Admitting: Family Medicine

## 2016-07-28 VITALS — BP 120/80 | HR 89 | Temp 98.2°F | Wt 149.8 lb

## 2016-07-28 DIAGNOSIS — W57XXXA Bitten or stung by nonvenomous insect and other nonvenomous arthropods, initial encounter: Secondary | ICD-10-CM

## 2016-07-28 DIAGNOSIS — S70362A Insect bite (nonvenomous), left thigh, initial encounter: Secondary | ICD-10-CM

## 2016-07-28 DIAGNOSIS — S70372A Other superficial bite of left thigh, initial encounter: Secondary | ICD-10-CM

## 2016-07-28 NOTE — Progress Notes (Signed)
   Subjective:    Patient ID: Valerie Delgado, female    DOB: September 12, 1965, 51 y.o.   MRN: [id number]  HPI Chief Complaint  Patient presents with  . bug bite    bug bite- on leg and back of leg.    She is here with complaints of a bug bite, not a tick, to her left anterior mid thigh and left posterior knee approximately one week ago. States she has been using tweezers and peroxide and "picking at it".  Denies history of MRSA or diabetes.   Denies fever, chills, rash, headache, N/V/D.    Review of Systems Pertinent positives and negatives in the history of present illness.     Objective:   Physical Exam BP 120/80   Pulse 89   Temp 98.2 F (36.8 C) (Oral)   Wt 149 lb 12.8 oz (67.9 kg)   BMI 24.18 kg/m   Left anterior mid thigh with a 0.5 cm round ulcerated area with pink granulation tissue. No surrounding erythema, induration or drainage. Left posterior knee with a pinpoint scab, no sing of infection.       Assessment & Plan:  Insect bite, initial encounter  No sign of infection. Encouraged her to avoid touching or putting instruments in the wound that are not sterile and to use bacitracin and band aids for the next few days. She will follow up as needed.

## 2016-07-28 NOTE — Patient Instructions (Signed)
Use the bacitracin and band aids for the next 4-5 days. If you notice the area getting worse let me know.

## 2016-09-18 NOTE — Progress Notes (Signed)
Office Visit Note  Patient: Valerie Delgado             Date of Birth: 1965-04-10           MRN: [id number]             PCP: Girtha Rm, NP-C Referring: Girtha Rm, NP-C Visit Date: 09/20/2016 Occupation: @GUAROCC @    Subjective: Pain in hands and wrists.   History of Present Illness: Valerie Delgado is a 51 y.o. female return after her last visit about a year ago. At that time her TB gold was positive and she was sent to health Department. She states she had rifampin for 4 months. She continues to have pain and discomfort in her bilateral wrist joints and bilateral hands. She states difficult for her to wear a watch at times. She also has pain and discomfort in her right hip joint and bilateral ankles and feet. She does notice swelling in her hands and her feet. She does have some stiffness in her neck.  Activities of Daily Living:  Patient reports morning stiffness for 1 hour.   Patient Reports nocturnal pain.  Difficulty dressing/grooming: Reports Difficulty climbing stairs: Reports Difficulty getting out of chair: Reports Difficulty using hands for taps, buttons, cutlery, and/or writing: Reports   Review of Systems  Constitutional: Positive for fatigue and weight gain. Negative for night sweats, weight loss and weakness.  HENT: Positive for mouth dryness. Negative for mouth sores, trouble swallowing, trouble swallowing and nose dryness.   Eyes: Positive for dryness. Negative for pain, redness and visual disturbance.  Respiratory: Negative for cough, shortness of breath and difficulty breathing.   Cardiovascular: Negative for chest pain, palpitations, hypertension, irregular heartbeat and swelling in legs/feet.  Gastrointestinal: Negative for blood in stool, constipation and diarrhea.  Endocrine: Negative for increased urination.  Genitourinary: Negative for vaginal dryness.  Musculoskeletal: Positive for arthralgias, joint pain, joint swelling and morning stiffness.  Negative for myalgias, muscle weakness, muscle tenderness and myalgias.  Skin: Positive for rash. Negative for color change, hair loss, skin tightness, ulcers and sensitivity to sunlight.  Allergic/Immunologic: Negative for susceptible to infections.  Neurological: Negative for dizziness, memory loss and night sweats.  Hematological: Negative for swollen glands.  Psychiatric/Behavioral: Positive for depressed mood and sleep disturbance. The patient is not nervous/anxious.     PMFS History:  Patient Active Problem List   Diagnosis Date Noted  . Essential hypertension 09/19/2016  . History of anxiety and depression 09/19/2016  . History of Meniere's disease 09/19/2016  . History of hearing loss 09/19/2016  . History of ADHD 09/19/2016  . Dyspareunia, female 01/11/2016  . Routine general medical examination at a health care facility 01/11/2016  . Smoker 09/01/2015  . Anxiety and depression 09/01/2015  . Positive QuantiFERON-TB Gold test 09/01/2015  . History of herpes simplex infection 09/01/2015    Past Medical History:  Diagnosis Date  . Anxiety   . Depression   . Meniere's disease of left ear   . Psoriatic arthritis ([place])    Dr. Estanislado Pandy  . Tuberculosis    started treatment on 08/30/2015    Family History  Problem Relation Age of Onset  . Hypertension Mother   . Cancer Mother        breast  . Arthritis Mother   . Depression Mother   . Hypertension Father   . Hypertension Brother   . Stroke Brother    Past Surgical History:  Procedure Laterality Date  . CESAREAN SECTION  Social History   Social History Narrative  . No narrative on file     Objective: Vital Signs: BP (!) 148/92   Pulse 98   Resp 14   Ht 5' 6"  (1.676 m)   Wt 154 lb (69.9 kg)   BMI 24.86 kg/m    Physical Exam  Constitutional: She is oriented to person, place, and time. She appears well-developed and well-nourished.  HENT:  Head: Normocephalic and atraumatic.  Eyes: Conjunctivae and  EOM are normal.  Neck: Normal range of motion.  Cardiovascular: Normal rate, regular rhythm, normal heart sounds and intact distal pulses.   Pulmonary/Chest: Effort normal and breath sounds normal.  Abdominal: Soft. Bowel sounds are normal.  Lymphadenopathy:    She has no cervical adenopathy.  Neurological: She is alert and oriented to person, place, and time.  Skin: Skin is warm and dry. Capillary refill takes less than 2 seconds. Rash noted.  Dryness over bilateral elbows. Nail dystrophy and nail pitting was noted on examination  Psychiatric: She has a normal mood and affect. Her behavior is normal.  Nursing note and vitals reviewed.    Musculoskeletal Exam: C-spine and thoracic lumbar spine good range of motion. Shoulder joints although joints wrist joints are good range of motion. She has bilateral CMC thickening consistent with osteoarthritis. She has bilateral DIP swelling with synovitis and tenderness. Hip joints: Right hip joint at painful range of motion., Bilateral knee joints good range of motion with no warmth swelling or effusion. She tenderness across her mid foot. No synovitis noted over MTPs PIPs or DIPs.  CDAI Exam: CDAI Homunculus Exam:   Tenderness:  Right hand: 2nd DIP, 3rd DIP, 4th DIP and 5th DIP Left hand: 2nd DIP, 3rd DIP, 4th DIP and 5th DIP RLE: acetabulofemoral  Swelling:  Right hand: 2nd DIP, 3rd DIP, 4th DIP and 5th DIP Left hand: 2nd DIP, 3rd DIP, 4th DIP and 5th DIP  Joint Counts:  CDAI Tender Joint count: 0 CDAI Swollen Joint count: 0  Global Assessments:  Patient Global Assessment: 7 Provider Global Assessment: 7  CDAI Calculated Score: 14    Investigation: Findings:  June 2017:  CBC, comprehensive metabolic panel, sed rate, CK, TSH, UA were normal.  ANA was low titer, 1:40 nuclear speckled pattern.  HLA-B27 was negative.  Hepatitis panel, G6PD, HIV, immunoglobulins, SPEP were normal.  Her TB Gold was positive.    We decided to proceed with  ultrasound examination of her hands to look for any underlying synovitis.   After informed consent was obtained, per EULAR recommendations, ultrasound examination of bilateral hands was performed.  Using 12 megahertz transducer, gray scale and power Doppler, bilateral 1st, 2nd, 3rd, 4th DIP joints, and bilateral 2nd MCP joints, and bilateral wrist joints, both dorsal and volar aspects, were evaluated to look for synovitis or tenosynovitis.  The findings were she had joint space narrowing of all of her DIP joints with erosive changes.  She had synovitis in the right 3rd and 4th DIP joints and 2nd left DIP joint with synovial thickening of all the joints.  She had narrowing of the PIP joints, which were evaluated.  MCP joints bilaterally showed joint space narrowing was not noted, but there was synovitis in bilateral 2nd MCP joints.  Right wrist joint showed synovitis.  There was no synovitis in left wrist joint.  The right median nerve was 0.07 cm2, which was within normal limits, and left median nerve was 0.08 cm2, which was within normal limits.   08/04/2015  Inflammatory arthritis with DIP involvement and nail dystrophy.  These findings are consistent with psoriatic arthritis.  She has severe erosive disease with synovitis of her DIP joints, although there is no history of dactylitis, plantar fasciitis or Achilles tendinitis.  There is no family history of psoriasis and she does not have any active psoriasis lesions.  She is HLA-B27 negative as well.  We had detailed discussion regarding psoriatic arthritis.    01/11/2016 a note from health Department and states that patient had 4 months of rifampin for positive TB gold.    Imaging: Xr Hip Unilat W Or W/o Pelvis 2-3 Views Right  Result Date: 09/20/2016 No significant hip joint narrowing was noted. No erosive changes were noted.  Xr Foot 2 Views Left  Result Date: 09/20/2016 Minimal PIP/DIP joint space narrowing was noted. No erosive changes were noted.  No intertarsal joint space narrowing was noted. Impression: These findings are consistent with mild osteoarthritis of the foot.  Xr Foot 2 Views Right  Result Date: 09/20/2016 Minimal PIP/DIP joint space narrowing was noted. No erosive changes were noted. No intertarsal joint space narrowing was noted. Impression: These findings are consistent with mild osteoarthritis of the foot.  Xr Hand 2 View Left  Result Date: 09/20/2016 CMC narrowing was noted. PIP/DIP joint narrowing was noted. Erosive changes noted in first DIP joint. These findings were consistent with osteoarthritis and psoriatic arthritis.  Xr Hand 2 View Right  Result Date: 09/20/2016 Severe erosive changes were noted in all DIP joints. Some DIP joint narrowing was noted. No intercarpal joint space narrowing was noted. Right CMC narrowing was noted. Impression: Findings are consistent with osteoarthritis and psoriatic arthritis.   Speciality Comments: No specialty comments available.    Procedures:  No procedures performed Allergies: Sulfur   Assessment / Plan:     Visit Diagnoses: Psoriatic arthropathy ([place]) - Erosive disease with DIP involvement. Nail dystrophy. Positive synovitis on ultrasound. She returns today after one year later. She continues to have ongoing pain and discomfort in her bilateral hands all of her feet and right hip joint. Different treatment options and side effects were discussed. She wants to proceed with methotrexate at this point. Handout was given consent was taken. Once her labs are available I'll be able to start her on methotrexate subcutaneous 0.4 mL every week and if tolerated we will increase it to 0.6 mL of normal and then 0.8 ML by mouth every week. Folic acid 2 mg by mouth daily was given. She will need lab work every 2 weeks 3 and then every 2 months.  High risk medication use - Plan: CBC with Differential/Platelet, COMPLETE METABOLIC PANEL WITH GFR   Pain in both hands - Plan: XR Hand 2  View Right, XR Hand 2 View Left. X-ray showed erosive changes and DIP joints bilaterally  Pain of right hip joint - Plan: XR HIP UNILAT W OR W/O PELVIS 2-3 VIEWS RIGHT. X-ray of the hip was unremarkable.  Pain in both feet - Plan: XR Foot 2 Views Right, XR Foot 2 Views Left. X-ray showed bilateral mild osteoarthritic changes.  Smoker: Smoking cessation discussed.  History of herpes simplex infection  Positive QuantiFERON-TB Gold test - Treated with rifampin for 4 months.  History of anxiety and depression  Essential hypertension  History of Meniere's disease  History of hearing loss  History of ADHD    Orders: Orders Placed This Encounter  Procedures  . XR Hand 2 View Right  . XR Hand 2 View Left  .  XR HIP UNILAT W OR W/O PELVIS 2-3 VIEWS RIGHT  . XR Foot 2 Views Right  . XR Foot 2 Views Left  . CBC with Differential/Platelet  . COMPLETE METABOLIC PANEL WITH GFR   No orders of the defined types were placed in this encounter.   Face-to-face time spent with patient was 35 minutes. Greater than 50% of time was spent in counseling and coordination of care.  Follow-Up Instructions: Return in about 3 months (around 12/21/2016) for Psoriatic arthritis.   Bo Merino, MD  Note - This record has been created using Editor, commissioning.  Chart creation errors have been sought, but may not always  have been located. Such creation errors do not reflect on  the standard of medical care.

## 2016-09-19 DIAGNOSIS — Z8659 Personal history of other mental and behavioral disorders: Secondary | ICD-10-CM | POA: Insufficient documentation

## 2016-09-19 DIAGNOSIS — I10 Essential (primary) hypertension: Secondary | ICD-10-CM | POA: Insufficient documentation

## 2016-09-19 DIAGNOSIS — Z8669 Personal history of other diseases of the nervous system and sense organs: Secondary | ICD-10-CM | POA: Insufficient documentation

## 2016-09-20 ENCOUNTER — Ambulatory Visit (INDEPENDENT_AMBULATORY_CARE_PROVIDER_SITE_OTHER): Payer: 59

## 2016-09-20 ENCOUNTER — Encounter: Payer: Self-pay | Admitting: Rheumatology

## 2016-09-20 ENCOUNTER — Ambulatory Visit (INDEPENDENT_AMBULATORY_CARE_PROVIDER_SITE_OTHER): Payer: 59 | Admitting: Rheumatology

## 2016-09-20 VITALS — BP 148/92 | HR 98 | Resp 14 | Ht 66.0 in | Wt 154.0 lb

## 2016-09-20 DIAGNOSIS — Z8619 Personal history of other infectious and parasitic diseases: Secondary | ICD-10-CM | POA: Diagnosis not present

## 2016-09-20 DIAGNOSIS — M79642 Pain in left hand: Secondary | ICD-10-CM

## 2016-09-20 DIAGNOSIS — M25551 Pain in right hip: Secondary | ICD-10-CM

## 2016-09-20 DIAGNOSIS — R7612 Nonspecific reaction to cell mediated immunity measurement of gamma interferon antigen response without active tuberculosis: Secondary | ICD-10-CM | POA: Diagnosis not present

## 2016-09-20 DIAGNOSIS — Z79899 Other long term (current) drug therapy: Secondary | ICD-10-CM | POA: Diagnosis not present

## 2016-09-20 DIAGNOSIS — M79641 Pain in right hand: Secondary | ICD-10-CM | POA: Diagnosis not present

## 2016-09-20 DIAGNOSIS — L4050 Arthropathic psoriasis, unspecified: Secondary | ICD-10-CM | POA: Diagnosis not present

## 2016-09-20 DIAGNOSIS — M79672 Pain in left foot: Secondary | ICD-10-CM | POA: Diagnosis not present

## 2016-09-20 DIAGNOSIS — Z8659 Personal history of other mental and behavioral disorders: Secondary | ICD-10-CM

## 2016-09-20 DIAGNOSIS — Z8669 Personal history of other diseases of the nervous system and sense organs: Secondary | ICD-10-CM

## 2016-09-20 DIAGNOSIS — F17200 Nicotine dependence, unspecified, uncomplicated: Secondary | ICD-10-CM | POA: Diagnosis not present

## 2016-09-20 DIAGNOSIS — I10 Essential (primary) hypertension: Secondary | ICD-10-CM | POA: Diagnosis not present

## 2016-09-20 DIAGNOSIS — M79671 Pain in right foot: Secondary | ICD-10-CM

## 2016-09-20 LAB — CBC WITH DIFFERENTIAL/PLATELET
BASOS ABS: 0 {cells}/uL (ref 0–200)
Basophils Relative: 0 %
EOS PCT: 1 %
Eosinophils Absolute: 102 cells/uL (ref 15–500)
HEMATOCRIT: 42.3 % (ref 35.0–45.0)
Hemoglobin: 13.8 g/dL (ref 11.7–15.5)
LYMPHS PCT: 30 %
Lymphs Abs: 3060 cells/uL (ref 850–3900)
MCH: 30.5 pg (ref 27.0–33.0)
MCHC: 32.6 g/dL (ref 32.0–36.0)
MCV: 93.6 fL (ref 80.0–100.0)
MPV: 10.7 fL (ref 7.5–12.5)
Monocytes Absolute: 612 cells/uL (ref 200–950)
Monocytes Relative: 6 %
NEUTROS PCT: 63 %
Neutro Abs: 6426 cells/uL (ref 1500–7800)
Platelets: 365 10*3/uL (ref 140–400)
RBC: 4.52 MIL/uL (ref 3.80–5.10)
RDW: 13.4 % (ref 11.0–15.0)
WBC: 10.2 10*3/uL (ref 3.8–10.8)

## 2016-09-20 NOTE — Patient Instructions (Addendum)
If your labs today are normal, we will plan to start methotrexate 0.4 mL under the skin weekly for two weeks.  Please get blood work two weeks after starting methotrexate.  If labs are stable, will increase the methotrexate dose to 0.6 mL under the skin weekly for two weeks.  Please get blood work two weeks after increasing to 0.6 mL weekly.  If labs are stable, will increase to 0.8 mL under the skin weekly.  Please get blood work again two weeks after increasing dose.  If stable, will continue methotrexate 0.8 mL under the skin weekly.    Please start taking folic acid 2 mg by mouth daily  Standing Labs We placed an order today for your standing lab work.    Please come back and get your standing labs in 2 weeks x 3, then every 2 months  We have open lab Monday through Friday from 8:30-11:30 AM and 1:30-4 PM at the office of Dr. Bo Merino.   The office is located at [address], [place], [place], [place] [address] No appointment is necessary.   Labs are drawn by [facility].  You may receive a bill from [facility] for your lab work. If you have any questions regarding directions or hours of operation,  please call [phone].    Methotrexate subcutaneous injection What is this medicine? METHOTREXATE (METH oh TREX ate) is a cytotoxic drug that also suppresses the immune system. It is used to treat psoriasis and rheumatoid arthritis. This medicine may be used for other purposes; ask your health care provider or pharmacist if you have questions. COMMON BRAND NAME(S): Otrexup, Rasuvo What should I tell my health care provider before I take this medicine? They need to know if you have any of these conditions: -fluid in the stomach area or lungs -if you often drink alcohol -infection or immune system problems -kidney disease -liver disease -low blood counts, like low white cell, platelet, or red cell counts -lung disease -radiation therapy -stomach ulcers -ulcerative colitis -an  unusual or allergic reaction to methotrexate, other medicines, foods, dyes, or preservatives -pregnant or trying to get pregnant -breast-feeding How should I use this medicine? This medicine is for injection under the skin. You will be taught how to prepare and give this medicine. Refer to the Instructions for Use that come with your medication packaging. Use exactly as directed. Take your medicine at regular intervals. Do not take your medicine more often than directed. This medicine should be taken weekly, NOT daily. It is important that you put your used needles and syringes in a special sharps container. Do not put them in a trash can. If you do not have a sharps container, call your pharmacist of healthcare provider to get one. Talk to your pediatrician regarding the use of this medicine in children. While this drug may be prescribed for children as young as 2 years for selected conditions, precautions do apply. Overdosage: If you think you have taken too much of this medicine contact a poison control center or emergency room at once. NOTE: This medicine is only for you. Do not share this medicine with others. What if I miss a dose? If you are not sure if this medicine was injected or if you have a hard time giving the injection, do not inject another dose. Talk with your doctor or health care professional. What may interact with this medicine? This medicine may interact with the following medications: -acitretin -aspirin or aspirin-like medicines including salicylates -azathioprine -certain antibiotics  like chloramphenicol, penicillin, tetracycline -cyclosporine -gold -hydroxychloroquine -live virus vaccines -mercaptopurine -NSAIDs, medicines for pain and inflammation, like ibuprofen or naproxen -other cytotoxic agents -penicillamine -phenylbutazone -phenytoin -probenacid -retinoids such as isotretinoin and tretinoin -steroid medicines like prednisone or cortisone -sulfonamides  like sulfasalazine and trimethoprim/sulfamethoxazole -theophylline This list may not describe all possible interactions. Give your health care provider a list of all the medicines, herbs, non-prescription drugs, or dietary supplements you use. Also tell them if you smoke, drink alcohol, or use illegal drugs. Some items may interact with your medicine. What should I watch for while using this medicine? Avoid alcoholic drinks. This medicine can make you more sensitive to the sun. Keep out of the sun. If you cannot avoid being in the sun, wear protective clothing and use sunscreen. Do not use sun lamps or tanning beds/booths. You may get drowsy or dizzy. Do not drive, use machinery, or do anything that needs mental alertness until you know how this medicine affects you. Do not stand or sit up quickly, especially if you are an older patient. This reduces the risk of dizzy or fainting spells. You may need blood work done while you are taking this medicine. Call your doctor or health care professional for advice if you get a fever, chills or sore throat, or other symptoms of a cold or flu. Do not treat yourself. This drug decreases your body's ability to fight infections. Try to avoid being around people who are sick. This medicine may increase your risk to bruise or bleed. Call your doctor or health care professional if you notice any unusual bleeding. Check with your doctor or health care professional if you get an attack of severe diarrhea, nausea and vomiting, or if you sweat a lot. The loss of too much body fluid can make it dangerous for you to take this medicine. Talk to your doctor about your risk of cancer. You may be more at risk for certain types of cancers if you take this medicine. Both men and women must use effective birth control with this medicine. Do not become pregnant while taking this medicine or until at least 1 normal menstrual cycle has occurred after stopping it. Women should inform  their doctor if they wish to become pregnant or think they might be pregnant. Men should not father a child while taking this medicine and for 3 months after stopping it. There is a potential for serious side effects to an unborn child. Talk to your health care professional or pharmacist for more information. Do not breast-feed an infant while taking this medicine. What side effects may I notice from receiving this medicine? Side effects that you should report to your doctor or health care professional as soon as possible: -allergic reactions like skin rash, itching or hives, swelling of the face, lips, or tongue -breathing problems or shortness of breath -diarrhea -dry, nonproductive cough -low blood counts - this medicine may decrease the number of white blood cells, red blood cells and platelets. You may be at increased risk of infections and bleeding -mouth sores -redness, blistering, peeling or loosening of the skin, including inside the mouth -signs of infection - fever or chills, cough, sore throat, pain or difficulty passing urine -signs and symptoms of bleeding such as bloody or black, tarry stools; red or dark-brown urine; spitting up blood or brown material that looks like coffee grounds; red spots on the skin; unusual bruising or bleeding from the eye, gums, or nose -signs and symptoms of kidney injury like  trouble passing urine or change in the amount of urine -signs and symptoms of liver injury like dark yellow or brown urine; general ill feeling or flu-like symptoms; light-colored stools; loss of appetite; nausea; right upper belly pain; unusually weak or tired; yellowing of the eyes or skin Side effects that usually do not require medical attention (report to your doctor or health care professional if they continue or are bothersome): -dizziness -hair loss -headache -stomach pain -upset stomach -vomiting This list may not describe all possible side effects. Call your doctor for  medical advice about side effects. You may report side effects to FDA at 1-800-FDA-1088. Where should I keep my medicine? Keep out of the reach of children. You will be instructed on how to store this medicine. Throw away any unused medicine after the expiration date on the label. NOTE: This sheet is a summary. It may not cover all possible information. If you have questions about this medicine, talk to your doctor, pharmacist, or health care provider.  2018 Elsevier/Gold Standard (2015-03-11 11:50:46)

## 2016-09-20 NOTE — Progress Notes (Signed)
Pharmacy Note  Subjective: Patient presents today to the [facility] to see Dr. Estanislado Pandy.  Valerie Delgado is a 51 yo F who presents for follow up of psoriatic arthritis.  Patient's most recent visit was on 08/04/2015 at which time methotrexate was being considered after patient completed treatment for latent TB.  Today, patient confirms she completed four months of rifampin.  I called Tammy at [facility] who confirmed patient completed treatment of latent tuberculosis.  She will fax over records confirming completion of treatment.  Decision was made to get baseline CBC and CMP and proceed with initiation of methotrexate.  Patient seen by the pharmacist for counseling on methotrexate.    Objective: CBC    Component Value Date/Time   WBC 8.5 01/11/2016 0846   RBC 4.42 01/11/2016 0846   HGB 13.5 01/11/2016 0846   HCT 41.0 01/11/2016 0846   PLT 331 01/11/2016 0846   MCV 92.8 01/11/2016 0846   MCH 30.5 01/11/2016 0846   MCHC 32.9 01/11/2016 0846   RDW 13.4 01/11/2016 0846   LYMPHSABS 2,805 01/11/2016 0846   MONOABS 765 01/11/2016 0846   EOSABS 85 01/11/2016 0846   BASOSABS 0 01/11/2016 0846   CMP     Component Value Date/Time   NA 138 01/11/2016 0846   K 4.4 01/11/2016 0846   CL 104 01/11/2016 0846   CO2 27 01/11/2016 0846   GLUCOSE 89 01/11/2016 0846   BUN 11 01/11/2016 0846   CREATININE 0.69 01/11/2016 0846   CALCIUM 9.2 01/11/2016 0846   PROT 6.5 01/11/2016 0846   ALBUMIN 4.1 01/11/2016 0846   AST 18 01/11/2016 0846   ALT 17 01/11/2016 0846   ALKPHOS 63 01/11/2016 0846   BILITOT 0.4 01/11/2016 0846   TB Gold: positive (07/23/15), patient has completed latent tuberculosis treatment with four months of rifampin at [facility] Hepatitis panel: negative (07/23/15) HIV: negative (07/23/15)  Chest-xray:  08/05/15 "IMPRESSION: No active cardiopulmonary disease."   Contraception: post-menopausal  Alcohol use: yes, discussed  minimizing alcohol use to 1 drink per week  Assessment/Plan:  Plan is to initiate patient on methotrexate.  Discussed oral versus subcutnaeous methotrexate.  Patient prefers subcutaneous methotrexate injections.  Patient was counseled on the purpose, proper use, and adverse effects of methotrexate including nausea, infection, and signs and symptoms of pneumonitis.  Reviewed instructions with patient to take methotrexate subcutaneous injections weekly along with folic acid daily.  Discussed the importance of frequent monitoring of kidney and liver function and blood counts, and provided patient with standing lab instructions.  Counseled patient to avoid NSAIDs and alcohol while on methotrexate.  Provided patient with educational materials on methotrexate and answered all questions.  Advised patient to get annual influenza vaccine and to get a pneumococcal vaccine if patient has not already had one.  Patient voiced understanding.  Patient consented to methotrexate use.  Will upload into chart.  Educated patient on how to use a vial and syringe and reviewed injection technique with patient.  Provided patient on educational material regarding injection technique and storage of methotrexate.    Elisabeth Most, Pharm.D., BCPS Clinical Pharmacist Pager: [phone] Phone: [phone] 09/20/2016 12:26 PM

## 2016-09-21 LAB — COMPLETE METABOLIC PANEL WITH GFR
ALT: 12 U/L (ref 6–29)
AST: 15 U/L (ref 10–35)
Albumin: 4.1 g/dL (ref 3.6–5.1)
Alkaline Phosphatase: 72 U/L (ref 33–130)
BUN: 12 mg/dL (ref 7–25)
CALCIUM: 9.3 mg/dL (ref 8.6–10.4)
CHLORIDE: 103 mmol/L (ref 98–110)
CO2: 21 mmol/L (ref 20–31)
Creat: 0.72 mg/dL (ref 0.50–1.05)
GFR, Est African American: >89 mL/min (ref >=60)
Glucose, Bld: 77 mg/dL (ref 65–99)
POTASSIUM: 4.3 mmol/L (ref 3.5–5.3)
SODIUM: 139 mmol/L (ref 135–146)
Total Bilirubin: 0.4 mg/dL (ref 0.2–1.2)
Total Protein: 6.5 g/dL (ref 6.1–8.1)

## 2016-09-21 NOTE — Progress Notes (Signed)
Within normal limits

## 2016-09-22 ENCOUNTER — Telehealth: Payer: Self-pay | Admitting: Rheumatology

## 2016-09-22 MED ORDER — FOLIC ACID 1 MG PO TABS: 2.0000 mg | ORAL_TABLET | Freq: Every day | ORAL | 3 refills | Status: DC

## 2016-09-22 MED ORDER — METHOTREXATE SODIUM CHEMO INJECTION 50 MG/2ML: INTRAMUSCULAR | 0 refills | Status: DC

## 2016-09-22 MED ORDER — "TUBERCULIN-ALLERGY SYRINGES 27G X 1/2"" 1 ML MISC": 3 refills | Status: DC

## 2016-09-22 NOTE — Telephone Encounter (Signed)
Patient calling about medication. Patient saw that labs were all within normal range, but has not gotten a call about meds being called in. Please call to advise. Patient uses [facility] on [address].

## 2016-09-22 NOTE — Telephone Encounter (Signed)
Patient advised prescription has been sent to the pharmacy.  

## 2016-10-17 ENCOUNTER — Other Ambulatory Visit: Payer: Self-pay | Admitting: *Deleted

## 2016-10-17 DIAGNOSIS — Z79899 Other long term (current) drug therapy: Secondary | ICD-10-CM

## 2016-10-17 LAB — CBC WITH DIFFERENTIAL/PLATELET
Basophils Absolute: 0 cells/uL (ref 0–200)
Basophils Relative: 0 %
EOS PCT: 1 %
Eosinophils Absolute: 101 cells/uL (ref 15–500)
HCT: 42.5 % (ref 35.0–45.0)
HEMOGLOBIN: 14.2 g/dL (ref 11.7–15.5)
LYMPHS ABS: 3131 {cells}/uL (ref 850–3900)
Lymphocytes Relative: 31 %
MCH: 31.4 pg (ref 27.0–33.0)
MCHC: 33.4 g/dL (ref 32.0–36.0)
MCV: 94 fL (ref 80.0–100.0)
MONOS PCT: 7 %
MPV: 10.5 fL (ref 7.5–12.5)
Monocytes Absolute: 707 cells/uL (ref 200–950)
NEUTROS ABS: 6161 {cells}/uL (ref 1500–7800)
NEUTROS PCT: 61 %
PLATELETS: 407 10*3/uL — AB (ref 140–400)
RBC: 4.52 MIL/uL (ref 3.80–5.10)
RDW: 13.6 % (ref 11.0–15.0)
WBC: 10.1 10*3/uL (ref 3.8–10.8)

## 2016-10-18 LAB — COMPLETE METABOLIC PANEL WITH GFR
ALT: 14 U/L (ref 6–29)
AST: 15 U/L (ref 10–35)
Albumin: 4.1 g/dL (ref 3.6–5.1)
Alkaline Phosphatase: 65 U/L (ref 33–130)
BUN: 23 mg/dL (ref 7–25)
CHLORIDE: 104 mmol/L (ref 98–110)
CO2: 24 mmol/L (ref 20–32)
CREATININE: 0.79 mg/dL (ref 0.50–1.05)
Calcium: 9.4 mg/dL (ref 8.6–10.4)
GFR, EST NON AFRICAN AMERICAN: 86 mL/min (ref >=60)
GFR, Est African American: >89 mL/min (ref >=60)
GLUCOSE: 90 mg/dL (ref 65–99)
POTASSIUM: 4.5 mmol/L (ref 3.5–5.3)
SODIUM: 140 mmol/L (ref 135–146)
Total Bilirubin: 0.3 mg/dL (ref 0.2–1.2)
Total Protein: 6.4 g/dL (ref 6.1–8.1)

## 2016-10-18 NOTE — Progress Notes (Signed)
stable °

## 2016-11-10 ENCOUNTER — Telehealth: Payer: Self-pay | Admitting: Rheumatology

## 2016-11-10 NOTE — Telephone Encounter (Signed)
Patient having a lot of increased pain since first two injections. Patient wants to come in sooner than Nov. Appt. Patient also wants to know if she should take the Flu shot. Please call patient to advise.

## 2016-11-10 NOTE — Telephone Encounter (Signed)
She will need appt.

## 2016-11-10 NOTE — Telephone Encounter (Signed)
I have advised patient to come in there was a cancellation for Tuesday, she is fine with this. She will d/c the Methotrexate since she can nto tolerate it

## 2016-11-10 NOTE — Telephone Encounter (Signed)
Patient states she started MTX in August 2018. Patient states she has just gotten up to the 0.8 mL dose. Patient states she hurts worse now than before taking the MTX. Patient states she has no energy. Patient would like to know what she can do. Patient advised she can get the flu vaccine but not the flu mist.  Please advise.

## 2016-11-10 NOTE — Telephone Encounter (Signed)
I have called her to offer work in appt for Monday left message for her to call us back

## 2016-11-13 DIAGNOSIS — M1990 Unspecified osteoarthritis, unspecified site: Secondary | ICD-10-CM | POA: Insufficient documentation

## 2016-11-13 NOTE — Progress Notes (Signed)
Office Visit Note  Patient: Valerie Delgado             Date of Birth: 1965/04/19           MRN: [id number]             PCP: Girtha Rm, NP-C Referring: Girtha Rm, NP-C Visit Date: 11/14/2016 Occupation: @GUAROCC @    Subjective:  Pain of the Left Hand and multiple joints.   History of Present Illness: Valerie Delgado is a 51 y.o. female with history of psoriatic arthritis she states her arthritis is getting worse. She's been on methotrexate for about a month now. She has not noticed any improvement in her symptoms. She had to any side effects to methotrexate and she decided not to continue methotrexate. Her last dose was last week. She's been having pain and discomfort in her bilateral hands, bilateral feet, bilateral ankles. She's been having a stiffness in her neck.  Activities of Daily Living:  Patient reports morning stiffness for all day  Patient Reports nocturnal pain.  Difficulty dressing/grooming: Reports Difficulty climbing stairs: Denies Difficulty getting out of chair: Denies Difficulty using hands for taps, buttons, cutlery, and/or writing: Reports   Review of Systems  Constitutional: Positive for fatigue. Negative for night sweats, weight gain, weight loss and weakness.  HENT: Negative.  Negative for mouth sores, trouble swallowing, trouble swallowing, mouth dryness and nose dryness.   Eyes: Negative.  Negative for pain, redness, visual disturbance and dryness.  Respiratory: Negative.  Negative for cough, shortness of breath and difficulty breathing.   Cardiovascular: Negative.  Negative for chest pain, palpitations, hypertension, irregular heartbeat and swelling in legs/feet.  Gastrointestinal: Positive for nausea. Negative for blood in stool, constipation and diarrhea.       With methotrexate / has d/c now  Endocrine: Negative for increased urination.  Genitourinary: Positive for genital sores. Negative for vaginal dryness.       Has had flare of herpes x2   since she has been on methotrexate   Musculoskeletal: Positive for arthralgias, joint pain, joint swelling, myalgias, muscle weakness, muscle tenderness and myalgias. Negative for morning stiffness.  Skin: Negative.  Negative for color change, rash, hair loss, skin tightness, ulcers and sensitivity to sunlight.  Allergic/Immunologic: Negative.  Negative for susceptible to infections.  Neurological: Negative for dizziness, headaches, memory loss and night sweats.  Hematological: Negative.  Negative for swollen glands.  Psychiatric/Behavioral: Positive for depressed mood and sleep disturbance. Negative for suicidal ideas. The patient is not nervous/anxious.        Using Cymbalta helps    PMFS History:  Patient Active Problem List   Diagnosis Date Noted  . Inflammatory arthritis 11/13/2016  . Essential hypertension 09/19/2016  . History of anxiety and depression 09/19/2016  . History of Meniere's disease 09/19/2016  . History of hearing loss 09/19/2016  . History of ADHD 09/19/2016  . Dyspareunia, female 01/11/2016  . Routine general medical examination at a health care facility 01/11/2016  . Smoker 09/01/2015  . Anxiety and depression 09/01/2015  . Positive QuantiFERON-TB Gold test 09/01/2015  . History of herpes simplex infection 09/01/2015    Past Medical History:  Diagnosis Date  . Anxiety   . Depression   . Meniere's disease of left ear   . Psoriatic arthritis ([place])    Dr. Estanislado Pandy  . Tuberculosis    started treatment on 08/30/2015    Family History  Problem Relation Age of Onset  . Hypertension Mother   . Cancer Mother  breast  . Arthritis Mother   . Depression Mother   . Hypertension Father   . Hypertension Brother   . Stroke Brother    Past Surgical History:  Procedure Laterality Date  . CESAREAN SECTION     Social History   Social History Narrative  . No narrative on file     Objective: Vital Signs: BP 140/88   Pulse 92   Resp 16   Ht 5' 6.5"  (1.689 m)   Wt 158 lb (71.7 kg)   BMI 25.12 kg/m    Physical Exam  Constitutional: She is oriented to person, place, and time. She appears well-developed and well-nourished.  HENT:  Head: Normocephalic and atraumatic.  Eyes: Conjunctivae and EOM are normal.  Neck: Normal range of motion.  Cardiovascular: Normal rate, regular rhythm, normal heart sounds and intact distal pulses.   Pulmonary/Chest: Effort normal and breath sounds normal.  Abdominal: Soft. Bowel sounds are normal.  Lymphadenopathy:    She has no cervical adenopathy.  Neurological: She is alert and oriented to person, place, and time.  Skin: Skin is warm and dry. Capillary refill takes less than 2 seconds.  Psychiatric: She has a normal mood and affect. Her behavior is normal.  Nursing note and vitals reviewed.    Musculoskeletal Exam: C-spine and thoracic lumbar spine good range of motion. Thoracic and lumbar spine good range of motion. Shoulder joints elbow joints wrist joints are good range of motion. She has synovitis over bilateral DIP joints. She is tenderness on palpation over left SI joint. Painful range of motion of her right hip joint. Knee joints are good range of motion. She is tenderness on palpation over bilateral ankle joints without any swelling. No MTP synovitis was noted.  CDAI Exam: CDAI Homunculus Exam:   Tenderness:  Right hand: 2nd DIP, 3rd DIP, 4th DIP and 5th DIP Left hand: 2nd DIP, 3rd DIP, 4th DIP and 5th DIP RLE: acetabulofemoral and tibiotalar LLE: tibiotalar  Swelling:  Right hand: 2nd DIP, 3rd DIP, 4th DIP and 5th DIP Left hand: 2nd DIP, 3rd DIP, 4th DIP and 5th DIP  Joint Counts:  CDAI Tender Joint count: 0 CDAI Swollen Joint count: 0  Global Assessments:  Patient Global Assessment: 8 Provider Global Assessment: 8  CDAI Calculated Score: 16   Investigation: Findings:  June 2017:  CBC, comprehensive metabolic panel, sed rate, CK, TSH, UA were normal.  ANA was low titer, 1:40  nuclear speckled pattern.  HLA-B27 was negative.  Hepatitis panel, G6PD, HIV, immunoglobulins, SPEP were normal.  Her TB Gold was positive.    CBC Latest Ref Rng & Units 10/17/2016 09/20/2016 01/11/2016  WBC 3.8 - 10.8 K/uL 10.1 10.2 8.5  Hemoglobin 11.7 - 15.5 g/dL 14.2 13.8 13.5  Hematocrit 35.0 - 45.0 % 42.5 42.3 41.0  Platelets 140 - 400 K/uL 407(H) 365 331   CMP Latest Ref Rng & Units 10/17/2016 09/20/2016 01/11/2016  Glucose 65 - 99 mg/dL 90 77 89  BUN 7 - 25 mg/dL 23 12 11   Creatinine 0.50 - 1.05 mg/dL 0.79 0.72 0.69  Sodium 135 - 146 mmol/L 140 139 138  Potassium 3.5 - 5.3 mmol/L 4.5 4.3 4.4  Chloride 98 - 110 mmol/L 104 103 104  CO2 20 - 32 mmol/L 24 21 27   Calcium 8.6 - 10.4 mg/dL 9.4 9.3 9.2  Total Protein 6.1 - 8.1 g/dL 6.4 6.5 6.5  Total Bilirubin 0.2 - 1.2 mg/dL 0.3 0.4 0.4  Alkaline Phos 33 - 130 U/L 65  72 63  AST 10 - 35 U/L 15 15 18   ALT 6 - 29 U/L 14 12 17     Imaging: No results found.  Speciality Comments: No specialty comments available.    Procedures:  No procedures performed Allergies: Sulfur   Assessment / Plan:     Visit Diagnoses: Psoriatic arthritis ([place]) - erosive disease. Positive synovitis on ultrasound. History of nail dystrophy.she continues to have a lot of pain and swelling in her hands.She had inadequate response and intolerance to methotrexate.different treatment options and their side effects were discussed at length. She wanted to proceed with the Enbrel. We will apply for Enbrel mini cartridge subcutaneous. Informed consent was obtained today. I will repeat a chest x-ray today as she had positive TB gold in the past. She will need yearly chest x-ray.patient requested Depo-Medrol injection as she is having a lot of pain and discomfort. Because her blood pressure is elevated I will give her 40 mg intramuscularly.  Counseled patient that Enbrel is a TNF blocking agent.  Reviewed Enbrel dose of 50 mg once weekly.  Counseled patient on purpose, proper  use, and adverse effects of Enbrel.  Reviewed the most common adverse effects including infections, headache, and injection site reactions. Discussed that there is the possibility of an increased risk of malignancy but it is not well understood if this increased risk is due to the medication or the disease state.  Advised patient to get yearly dermatology exams due to risk of skin cancer.  Reviewed the importance of regular labs while on Enbrel therapy.  Advised patient to get standing labs one month after starting Enbrel then every 2 months.  Provided patient with standing lab orders.  Counseled patient that Enbrel should be held prior to scheduled surgery.  Counseled patient to avoid live vaccines while on Enbrel.  Advised patient to get annual influenza vaccine and the pneumococcal vaccine as needed.  Provided patient with medication education material and answered all questions.  Patient voiced understanding.  Patient consented to Enbrel.  Will upload consent into the media tab.  Reviewed storage instructions for Enbrel.  Advised initial injection must be administered in office.  Patient voiced understanding.    High risk medication use - Methotrexate 0.8 ML subcutaneous every week (35/32/9924), folic acid 2 mg by mouth daily. Discontinued  ANA positive - low titer 1:40 nuclear speckled   Bilateral hand pain due to inflammation and synovitis.  Positive QuantiFERON-TB Gold test - treated with rifampin for 4 months.  Smoker: The smoking cessation discussed.  History of Meniere's disease  History of herpes simplex infection  History of hearing loss  History of anxiety and depression: She was quite tearful during the conversation today.  History of ADHD  History of hypertension    Orders: Orders Placed This Encounter  Procedures  . DG Chest 2 View  . CBC with Differential/Platelet  . COMPLETE METABOLIC PANEL WITH GFR   Meds ordered this encounter  Medications  . methylPREDNISolone  acetate (DEPO-MEDROL) injection 40 mg    Face-to-face time spent with patient was 30 minutes.Greater than 50% of time was spent in counseling and coordination of care.  Follow-Up Instructions: Return in about 3 months (around 02/13/2017) for Psoriatic arthritis.   Bo Merino, MD  Note - This record has been created using Editor, commissioning.  Chart creation errors have been sought, but may not always  have been located. Such creation errors do not reflect on  the standard of medical care.

## 2016-11-14 ENCOUNTER — Telehealth: Payer: Self-pay | Admitting: Radiology

## 2016-11-14 ENCOUNTER — Ambulatory Visit (INDEPENDENT_AMBULATORY_CARE_PROVIDER_SITE_OTHER): Payer: 59 | Admitting: Rheumatology

## 2016-11-14 ENCOUNTER — Encounter: Payer: Self-pay | Admitting: Rheumatology

## 2016-11-14 ENCOUNTER — Telehealth: Payer: Self-pay

## 2016-11-14 VITALS — BP 140/88 | HR 92 | Resp 16 | Ht 66.5 in | Wt 158.0 lb

## 2016-11-14 DIAGNOSIS — Z8679 Personal history of other diseases of the circulatory system: Secondary | ICD-10-CM

## 2016-11-14 DIAGNOSIS — Z8659 Personal history of other mental and behavioral disorders: Secondary | ICD-10-CM | POA: Diagnosis not present

## 2016-11-14 DIAGNOSIS — R768 Other specified abnormal immunological findings in serum: Secondary | ICD-10-CM

## 2016-11-14 DIAGNOSIS — R7612 Nonspecific reaction to cell mediated immunity measurement of gamma interferon antigen response without active tuberculosis: Secondary | ICD-10-CM | POA: Diagnosis not present

## 2016-11-14 DIAGNOSIS — M79641 Pain in right hand: Secondary | ICD-10-CM | POA: Diagnosis not present

## 2016-11-14 DIAGNOSIS — Z8669 Personal history of other diseases of the nervous system and sense organs: Secondary | ICD-10-CM

## 2016-11-14 DIAGNOSIS — Z8619 Personal history of other infectious and parasitic diseases: Secondary | ICD-10-CM | POA: Diagnosis not present

## 2016-11-14 DIAGNOSIS — F17200 Nicotine dependence, unspecified, uncomplicated: Secondary | ICD-10-CM | POA: Diagnosis not present

## 2016-11-14 DIAGNOSIS — Z79899 Other long term (current) drug therapy: Secondary | ICD-10-CM | POA: Diagnosis not present

## 2016-11-14 DIAGNOSIS — M79642 Pain in left hand: Secondary | ICD-10-CM

## 2016-11-14 DIAGNOSIS — L4050 Arthropathic psoriasis, unspecified: Secondary | ICD-10-CM | POA: Diagnosis not present

## 2016-11-14 MED ORDER — METHYLPREDNISOLONE ACETATE 40 MG/ML IJ SUSP
40.0000 mg | Freq: Once | INTRAMUSCULAR | Status: AC
  Administered 2016-11-14: 40 mg via INTRAMUSCULAR

## 2016-11-14 NOTE — Telephone Encounter (Signed)
A prior authorization for Enbrel MINI has been submitted to pt insurance via cover my meds. Will update once we receive a response.   Milind Raether, [place], CPhT 3:21 PM

## 2016-11-14 NOTE — Telephone Encounter (Signed)
Apply for Enbrel please, patient has signed consent form She will need a Chest xray first, because she has history of positive TB gold  She will be on vacation next week, so will be couple weeks before she can come in for a new start nurse visit

## 2016-11-14 NOTE — Patient Instructions (Addendum)
Go for your chest xray if you have it done at a facility other than [facility] please fax the report [phone], you will also need to have labs one month after you start the Enbrel   Standing Labs We placed an order today for your standing lab work.    Please come back and get your standing labs in one month  We have open lab Monday through Friday from 8:30-11:30 AM and 1:30-4 PM at the office of Dr. Bo Merino.   The office is located at [address] [place], [place], [place] [address] No appointment is necessary.   Labs are drawn by [facility].  You may receive a bill from [facility] for your lab work. If you have any questions regarding directions or hours of operation,  please call [phone].        Etanercept injection What is this medicine? ETANERCEPT (et a [employer]) is used for the treatment of rheumatoid arthritis in adults and children. The medicine is also used to treat psoriatic arthritis, ankylosing spondylitis, and psoriasis. This medicine may be used for other purposes; ask your health care provider or pharmacist if you have questions. COMMON BRAND NAME(S): Enbrel What should I tell my health care provider before I take this medicine? They need to know if you have any of these conditions: -blood disorders -cancer -congestive heart failure -diabetes -exposure to chickenpox -immune system problems -infection -multiple sclerosis -seizure disorder -tuberculosis, a positive skin test for tuberculosis or have recently been in close contact with someone who has tuberculosis -Wegener's granulomatosis -an unusual or allergic reaction to etanercept, latex, other medicines, foods, dyes, or preservatives -pregnant or trying to get pregnant -breast-feeding How should I use this medicine? The medicine is given by injection under the skin. You will be taught how to prepare and give this medicine. Use exactly as directed. Take your medicine at regular intervals. Do not  take your medicine more often than directed. It is important that you put your used needles and syringes in a special sharps container. Do not put them in a trash can. If you do not have a sharps container, call your pharmacist or healthcare provider to get one. A special MedGuide will be given to you by the pharmacist with each prescription and refill. Be sure to read this information carefully each time. Talk to your pediatrician regarding the use of this medicine in children. While this drug may be prescribed for children as young as 32 years of age for selected conditions, precautions do apply. Overdosage: If you think you have taken too much of this medicine contact a poison control center or emergency room at once. NOTE: This medicine is only for you. Do not share this medicine with others. What if I miss a dose? If you miss a dose, contact your health care professional to find out when you should take your next dose. Do not take double or extra doses without advice. What may interact with this medicine? Do not take this medicine with any of the following medications: -anakinra This medicine may also interact with the following medications: -cyclophosphamide -sulfasalazine -vaccines This list may not describe all possible interactions. Give your health care provider a list of all the medicines, herbs, non-prescription drugs, or dietary supplements you use. Also tell them if you smoke, drink alcohol, or use illegal drugs. Some items may interact with your medicine. What should I watch for while using this medicine? Tell your doctor or healthcare professional if your symptoms do  not start to get better or if they get worse. You will be tested for tuberculosis (TB) before you start this medicine. If your doctor prescribes any medicine for TB, you should start taking the TB medicine before starting this medicine. Make sure to finish the full course of TB medicine. Call your doctor or health care  professional for advice if you get a fever, chills or sore throat, or other symptoms of a cold or flu. Do not treat yourself. This drug decreases your body's ability to fight infections. Try to avoid being around people who are sick. What side effects may I notice from receiving this medicine? Side effects that you should report to your doctor or health care professional as soon as possible: -allergic reactions like skin rash, itching or hives, swelling of the face, lips, or tongue -changes in vision -fever, chills or any other sign of infection -numbness or tingling in legs or other parts of the body -red, scaly patches or raised bumps on the skin -shortness of breath or difficulty breathing -swollen lymph nodes in the neck, underarm, or groin areas -unexplained weight loss -unusual bleeding or bruising -unusual swelling or fluid retention in the legs -unusually weak or tired Side effects that usually do not require medical attention (report to your doctor or health care professional if they continue or are bothersome): -dizziness -headache -nausea -redness, itching, or swelling at the injection site -vomiting This list may not describe all possible side effects. Call your doctor for medical advice about side effects. You may report side effects to FDA at 1-800-FDA-1088. Where should I keep my medicine? Keep out of the reach of children. Store between 2 and 8 degrees C (36 and 46 degrees F). Do not freeze or shake. Protect from light. Throw away any unused medicine after the expiration date. You will be instructed on how to store this medicine. NOTE: This sheet is a summary. It may not cover all possible information. If you have questions about this medicine, talk to your doctor, pharmacist, or health care provider.  2018 Elsevier/Gold Standard (2011-08-14 15:33:36)

## 2016-11-14 NOTE — Progress Notes (Signed)
Administrations This Visit    methylPREDNISolone acetate (DEPO-MEDROL) injection 40 mg    Admin Date 11/14/2016 Action Given Dose 40 mg Route Intramuscular Administered By Carole Binning, LPN        Patient tolerated injection well.

## 2016-11-14 NOTE — Progress Notes (Signed)
TB Gold: positive (07/23/15), patient has completed latent tuberculosis treatment with four months of rifampin at [facility]  June 2017:  CBC, comprehensive metabolic panel, sed rate, CK, TSH, UA were normal.  ANA was low titer, 1:40 nuclear speckled pattern.  HLA-B27 was negative.  Hepatitis panel, G6PD, HIV, immunoglobulins, SPEP were normal.  Her TB Gold was positive.  Counseled patient that Enbrel is a TNF blocking agent.  Counseled patient on purpose, proper use, and adverse effects of Enbrel.  Reviewed the most common adverse effects including infections, headache, and injection site reactions.  Discussed that there is the possibility of an increased risk of malignancy but it is not well understood if this increased risk is due to the medication or the disease state.  Advised patient to get yearly dermatology exams due to risk of skin cancer.  Reviewed the importance of regular labs while on Enbrel therapy.  Counseled patient that Enbrel should be held prior to scheduled surgery.  Counseled patient to avoid live vaccines while on Enbrel. Provided patient with medication education material and answered all questions.  Patient consented to Enbrel.  Will upload consent into the media tab.  Reviewed storage instructions for Enbrel.  Advised initial injection must be administered in office.

## 2016-11-15 NOTE — Telephone Encounter (Signed)
Received a fax from [employer] regarding a prior authorization approval for ENBREL MINI from 11/14/2016 to 11/14/2017.   Reference number: [id number] Phone number:[phone]  Will send document to scan center.  Will need to inform patient, send rx to pharmacy and schedule nursing visit.  Foye Haggart, [place], CPhT 4:17 PM

## 2016-11-17 NOTE — Telephone Encounter (Signed)
Attempted to contact the patient and left message for patient to call the office.  

## 2016-11-23 NOTE — Telephone Encounter (Signed)
Attempted to contact the patient and left message for patient to call the office.  

## 2016-11-30 ENCOUNTER — Telehealth: Payer: Self-pay | Admitting: Rheumatology

## 2016-11-30 NOTE — Telephone Encounter (Signed)
See other phone note

## 2016-11-30 NOTE — Telephone Encounter (Signed)
Patient returned Andrea's call. Please call patient back.

## 2016-11-30 NOTE — Telephone Encounter (Addendum)
Left message for patient to call the office. Need to schedule nurse visit for Enbrel Mini new start.

## 2016-12-01 NOTE — Telephone Encounter (Signed)
Left message for patient to call the office. Need to schedule nurse visit for Enbrel Mini new start.

## 2016-12-07 NOTE — Telephone Encounter (Signed)
Called patient to schedule nurse visit and patient states she has not had a chest xray yet. Patient states she will try to get that done next week and call the office to schedule nurse visit.

## 2016-12-08 ENCOUNTER — Telehealth: Payer: Self-pay | Admitting: *Deleted

## 2016-12-08 NOTE — Telephone Encounter (Signed)
Patient called about where to go get a chest xray and patient advised to go to [facility] or [facility]. Patient verbalized understanding.

## 2016-12-10 NOTE — Progress Notes (Signed)
Office Visit Note  Patient: Valerie Delgado             Date of Birth: [birth year]           MRN: [id number]             PCP: Girtha Rm, NP-C Referring: Girtha Rm, NP-C Visit Date: 12/21/2016 Occupation: @GUAROCC @    Subjective:  Pain hands and right hip pain.   History of Present Illness: Valerie Delgado is a 51 y.o. female with history of psoriatic arthritis. She states she was seen by Dr. Lorin Mercy 2 days ago for right hip pain. She was given a cortisone injection in her right hip joint. She states the pain has improved to some extent. She continues to have pain and swelling in her bilateral hands. She did not respond to methotrexate and is coming today to get her first Enbrel injection.  Activities of Daily Living:  Patient reports morning stiffness for 1 hour.   Patient Reports nocturnal pain.  Difficulty dressing/grooming: Denies Difficulty climbing stairs: Reports Difficulty getting out of chair: Reports Difficulty using hands for taps, buttons, cutlery, and/or writing: Denies   Review of Systems  Constitutional: Negative.  Negative for fatigue, night sweats, weight gain, weight loss and weakness.  HENT: Negative for mouth sores, trouble swallowing, trouble swallowing, mouth dryness and nose dryness.   Eyes: Negative for pain, redness, visual disturbance and dryness.  Respiratory: Negative.  Negative for cough, shortness of breath and difficulty breathing.   Cardiovascular: Negative.  Negative for chest pain, palpitations, hypertension, irregular heartbeat and swelling in legs/feet.  Gastrointestinal: Negative.  Negative for blood in stool, constipation and diarrhea.  Endocrine: Negative.  Negative for increased urination.  Genitourinary: Negative.  Negative for nocturia and vaginal dryness.  Musculoskeletal: Positive for arthralgias, joint pain, muscle weakness and morning stiffness. Negative for joint swelling, myalgias, muscle tenderness and myalgias.  Skin:  Negative.  Negative for color change, rash, hair loss, skin tightness, ulcers and sensitivity to sunlight.  Allergic/Immunologic: Negative for susceptible to infections.  Neurological: Negative.  Negative for dizziness, headaches, memory loss and night sweats.  Hematological: Negative.  Negative for swollen glands.  Psychiatric/Behavioral: Negative.  Negative for depressed mood and sleep disturbance. The patient is not nervous/anxious.     PMFS History:  Patient Active Problem List   Diagnosis Date Noted  . Inflammatory arthritis 11/13/2016  . Essential hypertension 09/19/2016  . History of anxiety and depression 09/19/2016  . History of Meniere's disease 09/19/2016  . History of hearing loss 09/19/2016  . History of ADHD 09/19/2016  . Dyspareunia, female 01/11/2016  . Routine general medical examination at a health care facility 01/11/2016  . Smoker 09/01/2015  . Anxiety and depression 09/01/2015  . Positive QuantiFERON-TB Gold test 09/01/2015  . History of herpes simplex infection 09/01/2015    Past Medical History:  Diagnosis Date  . Anxiety   . Depression   . Meniere's disease of left ear   . Psoriatic arthritis ([place])    Dr. Estanislado Pandy  . Tuberculosis    started treatment on 08/30/2015    Family History  Problem Relation Age of Onset  . Hypertension Mother   . Cancer Mother        breast  . Arthritis Mother   . Depression Mother   . Hypertension Father   . Hypertension Brother   . Stroke Brother    Past Surgical History:  Procedure Laterality Date  . CESAREAN SECTION     Social History  Social History Narrative  . No narrative on file     Objective: Vital Signs: BP (!) 159/102   Pulse 85   Resp 14   Ht 5\' 6"  (1.676 m)   Wt 154 lb (69.9 kg)   BMI 24.86 kg/m    Physical Exam  Constitutional: She is oriented to person, place, and time. She appears well-developed and well-nourished.  HENT:  Head: Normocephalic and atraumatic.  Eyes: Conjunctivae and  EOM are normal.  Neck: Normal range of motion.  Cardiovascular: Normal rate, regular rhythm, normal heart sounds and intact distal pulses.   Pulmonary/Chest: Effort normal and breath sounds normal.  Abdominal: Soft. Bowel sounds are normal.  Lymphadenopathy:    She has no cervical adenopathy.  Neurological: She is alert and oriented to person, place, and time.  Skin: Skin is warm and dry. Capillary refill takes less than 2 seconds.  Psychiatric: She has a normal mood and affect. Her behavior is normal.  Nursing note and vitals reviewed.    Musculoskeletal Exam: C-spine and thoracic lumbar spine good range of motion. Shoulder joints elbow joints with good range of motion. She synovitis over her DIP joints bilaterally with tenderness. She is painful range of motion of her right hip joint. She is good range of motion of her knee joints without any warmth swelling or effusion. Ankles MTPs PIPs with some osteoarthritic changes but no synovitis.  CDAI Exam: CDAI Homunculus Exam:   Tenderness:  Right hand: 2nd DIP, 3rd DIP, 4th DIP and 5th DIP Left hand: 2nd DIP, 3rd DIP, 4th DIP and 5th DIP RLE: acetabulofemoral  Swelling:  Right hand: 2nd DIP, 3rd DIP, 4th DIP and 5th DIP Left hand: 2nd DIP, 3rd DIP, 4th DIP and 5th DIP  Joint Counts:  CDAI Tender Joint count: 0 CDAI Swollen Joint count: 0  Global Assessments:  Patient Global Assessment: 8 Provider Global Assessment: 7  CDAI Calculated Score: 15    Investigation: No additional findings. CBC Latest Ref Rng & Units 10/17/2016 09/20/2016 01/11/2016  WBC 3.8 - 10.8 K/uL 10.1 10.2 8.5  Hemoglobin 11.7 - 15.5 g/dL 14.2 13.8 13.5  Hematocrit 35.0 - 45.0 % 42.5 42.3 41.0  Platelets 140 - 400 K/uL 407(H) 365 331   CMP Latest Ref Rng & Units 10/17/2016 09/20/2016 01/11/2016  Glucose 65 - 99 mg/dL 90 77 89  BUN 7 - 25 mg/dL 23 12 11   Creatinine 0.50 - 1.05 mg/dL 0.79 0.72 0.69  Sodium 135 - 146 mmol/L 140 139 138  Potassium 3.5 - 5.3  mmol/L 4.5 4.3 4.4  Chloride 98 - 110 mmol/L 104 103 104  CO2 20 - 32 mmol/L 24 21 27   Calcium 8.6 - 10.4 mg/dL 9.4 9.3 9.2  Total Protein 6.1 - 8.1 g/dL 6.4 6.5 6.5  Total Bilirubin 0.2 - 1.2 mg/dL 0.3 0.4 0.4  Alkaline Phos 33 - 130 U/L 65 72 63  AST 10 - 35 U/L 15 15 18   ALT 6 - 29 U/L 14 12 17     Imaging: Dg Chest 2 View  Result Date: 12/18/2016 CLINICAL DATA:  Psoriatic arthritis. History of tuberculosis. Smoker. EXAM: CHEST  2 VIEW COMPARISON:  PA and lateral chest 08/05/2015. FINDINGS: The lungs are clear. Heart size is normal. No pneumothorax or pleural fluid. Aortic atherosclerosis is noted. No acute bony abnormality. Kyphosis centered about the T11-12 level is unchanged. IMPRESSION: No acute disease. Atherosclerosis. Electronically Signed   By: Inge Rise M.D.   On: 12/18/2016 08:43   Xr Lumbar  Spine 2-3 Views  Result Date: 12/18/2016 AP lateral lumbar spine x-rays obtained and reviewed. Negative for acute changes. Mild upper lumbar endplate spurring. Bilateral hip joint space narrowing  noted unchanged from August 2018 x-rays. Mild lumbar curvature noted. Small amount of aortic calcification Impression lumbar spine x-rays negative for acute changes.   Speciality Comments: No specialty comments available.    Procedures:  No procedures performed Allergies: Sulfur   Assessment / Plan:     Visit Diagnoses: Psoriatic arthritis ([place]) - erosive disease. Positive synovitis on ultrasound. She is inadequate response to methotrexate. She's having a lot of discomfort and pain in her hands and right hip. She had cortisone injection to her right hip joint without adequate response so far. We had detailed discussion regarding starting Enbrel. The counseling was provided during the last visit. She is related to proceed with Enbrel. She was given subcutaneous Enbrel injection the office today and was observed in the office with the next 30 minutes.She had no side effects from Enbrel.  Although her blood pressure stayed elevated for her diastolic more than 696.  High risk medication use - plan to start Enbrel (methotrexate inadequate response)   ANA positive - low titer 1:40 nuclear speckled   Positive QuantiFERON-TB Gold test - treated with rifampin for 4 months. Patient will need yearly chest x-ray.  Smoker: Smoking cessation was discussed at length.  Essential hypertension: Her blood pressure was elevated today. It could be because of recent cortisone injection for her hip. She's not taking any blood pressure medication. I strongly advised her to go to urgent care or emergency room or to her PCP. Patient states that she will go directly to her PCPs office.  Other medical problems are listed as follows:  History of Meniere's disease  History of herpes simplex infection  History of hearing loss  History of anxiety and depression  History of ADHD    Orders: No orders of the defined types were placed in this encounter.  No orders of the defined types were placed in this encounter.   Face-to-face time spent with patient was 50 minutes. Greater than 50% of time was spent in counseling and coordination of care.  Follow-Up Instructions: Return in about 3 months (around 03/23/2017) for Psoriatic arthritis.   Bo Merino, MD  Note - This record has been created using Editor, commissioning.  Chart creation errors have been sought, but may not always  have been located. Such creation errors do not reflect on  the standard of medical care.

## 2016-12-18 ENCOUNTER — Encounter (INDEPENDENT_AMBULATORY_CARE_PROVIDER_SITE_OTHER): Payer: Self-pay | Admitting: Orthopaedic Surgery

## 2016-12-18 ENCOUNTER — Ambulatory Visit (INDEPENDENT_AMBULATORY_CARE_PROVIDER_SITE_OTHER): Payer: 59 | Admitting: Orthopaedic Surgery

## 2016-12-18 ENCOUNTER — Telehealth: Payer: Self-pay

## 2016-12-18 ENCOUNTER — Ambulatory Visit (HOSPITAL_COMMUNITY)
Admission: RE | Admit: 2016-12-18 | Discharge: 2016-12-18 | Disposition: A | Discharge status: Home / Self Care | Payer: 59 | Source: Ambulatory Visit | Attending: Rheumatology | Admitting: Rheumatology

## 2016-12-18 ENCOUNTER — Ambulatory Visit (INDEPENDENT_AMBULATORY_CARE_PROVIDER_SITE_OTHER): Payer: 59

## 2016-12-18 VITALS — BP 151/95 | HR 111 | Ht 66.0 in | Wt 148.0 lb

## 2016-12-18 DIAGNOSIS — Z8611 Personal history of tuberculosis: Secondary | ICD-10-CM | POA: Insufficient documentation

## 2016-12-18 DIAGNOSIS — M79641 Pain in right hand: Secondary | ICD-10-CM | POA: Diagnosis not present

## 2016-12-18 DIAGNOSIS — M79642 Pain in left hand: Secondary | ICD-10-CM | POA: Insufficient documentation

## 2016-12-18 DIAGNOSIS — R918 Other nonspecific abnormal finding of lung field: Secondary | ICD-10-CM | POA: Diagnosis not present

## 2016-12-18 DIAGNOSIS — I700 Atherosclerosis of aorta: Secondary | ICD-10-CM | POA: Diagnosis not present

## 2016-12-18 DIAGNOSIS — F17200 Nicotine dependence, unspecified, uncomplicated: Secondary | ICD-10-CM | POA: Insufficient documentation

## 2016-12-18 DIAGNOSIS — L4050 Arthropathic psoriasis, unspecified: Secondary | ICD-10-CM | POA: Diagnosis not present

## 2016-12-18 DIAGNOSIS — M545 Low back pain: Secondary | ICD-10-CM | POA: Diagnosis not present

## 2016-12-18 DIAGNOSIS — M25551 Pain in right hip: Secondary | ICD-10-CM | POA: Diagnosis not present

## 2016-12-18 MED ORDER — LIDOCAINE HCL 1 % IJ SOLN
0.5000 mL | INTRAMUSCULAR | Status: AC | PRN
  Administered 2016-12-18: .5 mL

## 2016-12-18 MED ORDER — METHYLPREDNISOLONE ACETATE 40 MG/ML IJ SUSP
40.0000 mg | INTRAMUSCULAR | Status: AC | PRN
  Administered 2016-12-18: 40 mg via INTRA_ARTICULAR

## 2016-12-18 MED ORDER — BUPIVACAINE HCL 0.25 % IJ SOLN
2.0000 mL | INTRAMUSCULAR | Status: AC | PRN
  Administered 2016-12-18: 2 mL via INTRA_ARTICULAR

## 2016-12-18 NOTE — Telephone Encounter (Signed)
Patient completed chest xray this morning.   Patient is experiencing right hip pain and the pain radiates down her leg and up through her buttocks. She feels a pinching feeling in her groin area. Patient has been scheduled with Dr. Lorin Mercy. Just an fyi.

## 2016-12-18 NOTE — Progress Notes (Signed)
Chest x-ray normal

## 2016-12-18 NOTE — Progress Notes (Signed)
Office Visit Note   Patient: Valerie Delgado           Date of Birth: [birth year]           MRN: [id number] Visit Date: 12/18/2016              Requested by: Girtha Rm, NP-C [place], [place] [id number] PCP: Girtha Rm, NP-C   Assessment & Plan: Visit Diagnoses:  1. Acute right-sided low back pain, with sciatica presence unspecified     Plan: Patient may have some chondromalacia in her hip was some synovitis. Intra-articular injection performed the patient was able to ambulate without limping and with complete pain relief post injection. I'll recheck her in 4 weeks if she is having ongoing problems a consider MRI scan of her hip.  Follow-Up Instructions: No Follow-up on file.   Orders:  Orders Placed This Encounter  Procedures  . XR Lumbar Spine 2-3 Views   No orders of the defined types were placed in this encounter.     Procedures: Large Joint Inj Date/Time: 12/18/2016 11:42 AM Performed by: Marybelle Killings Authorized by: Rodell Perna C   Location:  Hip Site:  R hip joint Needle Size:  22 G Ultrasound Guidance: No   Fluoroscopic Guidance: No   Arthrogram: No   Medications:  2 mL bupivacaine 0.25 %; 0.5 mL lidocaine 1 %; 40 mg methylPREDNISolone acetate 40 MG/ML Aspiration Attempted: No        Clinical Data: No additional findings.   Subjective: Chief Complaint  Patient presents with  . Right Leg - Pain    HPI 51 year old female with right hip pain that radiates down from the groin to her knee she's been a mature with the lamp. She states the pain is intermittent and worse with increased activities been gradually getting worse. She's been diagnosed as psoriatic arthritis was not able to tolerate methotrexate. She took one of her mom's gabapentin pills and also one prednisone today that seemed to help some. She's had trouble with stairs has to grab [place] she is concerned that her right hip pain will cause her to fall.  Review of  Systems Review of systems positive for hypertension anxiety depression Mnire's disease past smoking history. Positive for psoriatic arthritis sleep apnea leg TB Gold appropriately treated by health department. Previous C-section 1988.  Objective: Vital Signs: BP (!) 151/95   Pulse (!) 111   Ht 5\' 6"  (1.676 m)   Wt 148 lb (67.1 kg)   BMI 23.89 kg/m   Physical Exam  Constitutional: She is oriented to person, place, and time. She appears well-developed.  HENT:  Head: Normocephalic.  Right Ear: External ear normal.  Left Ear: External ear normal.  Eyes: Pupils are equal, round, and reactive to light.  Neck: No tracheal deviation present. No thyromegaly present.  Cardiovascular: Normal rate.   Pulmonary/Chest: Effort normal.  Abdominal: Soft.  Neurological: She is alert and oriented to person, place, and time.  Skin: Skin is warm and dry.  Psychiatric: She has a normal mood and affect. Her behavior is normal.    Ortho Exam no inguinal lymphadenopathy. She has sharp pain with internal rotation of right hip 20. She cannot figure 4. Negative Faber test no SI joint tenderness. Knee exam ligamentous is normal no knee effusion. No inguinal lymphadenopathy. Pain with supine straight leg raise up to 20 with just groin pain. The popliteal compression test.  Specialty Comments:  No specialty comments available.  Imaging: Dg  Chest 2 View  Result Date: 12/18/2016 CLINICAL DATA:  Psoriatic arthritis. History of tuberculosis. Smoker. EXAM: CHEST  2 VIEW COMPARISON:  PA and lateral chest 08/05/2015. FINDINGS: The lungs are clear. Heart size is normal. No pneumothorax or pleural fluid. Aortic atherosclerosis is noted. No acute bony abnormality. Kyphosis centered about the T11-12 level is unchanged. IMPRESSION: No acute disease. Atherosclerosis. Electronically Signed   By: Inge Rise M.D.   On: 12/18/2016 08:43     PMFS History: Patient Active Problem List   Diagnosis Date Noted  .  Inflammatory arthritis 11/13/2016  . Essential hypertension 09/19/2016  . History of anxiety and depression 09/19/2016  . History of Meniere's disease 09/19/2016  . History of hearing loss 09/19/2016  . History of ADHD 09/19/2016  . Dyspareunia, female 01/11/2016  . Routine general medical examination at a health care facility 01/11/2016  . Smoker 09/01/2015  . Anxiety and depression 09/01/2015  . Positive QuantiFERON-TB Gold test 09/01/2015  . History of herpes simplex infection 09/01/2015   Past Medical History:  Diagnosis Date  . Anxiety   . Depression   . Meniere's disease of left ear   . Psoriatic arthritis ([place])    Dr. Estanislado Pandy  . Tuberculosis    started treatment on 08/30/2015    Family History  Problem Relation Age of Onset  . Hypertension Mother   . Cancer Mother        breast  . Arthritis Mother   . Depression Mother   . Hypertension Father   . Hypertension Brother   . Stroke Brother     Past Surgical History:  Procedure Laterality Date  . CESAREAN SECTION     Social History   Occupational History  . Not on file.   Social History Main Topics  . Smoking status: Current Every Day Smoker    Packs/day: 0.50    Years: 30.00  . Smokeless tobacco: Never Used  . Alcohol use 0.6 - 1.2 oz/week    1 - 2 Glasses of wine per week     Comment: 10 drinks per week.   . Drug use: No  . Sexual activity: Not on file     Comment: lives with her mother. 1 child age 62.

## 2016-12-21 ENCOUNTER — Ambulatory Visit (INDEPENDENT_AMBULATORY_CARE_PROVIDER_SITE_OTHER): Payer: 59 | Admitting: Rheumatology

## 2016-12-21 ENCOUNTER — Telehealth: Payer: Self-pay | Admitting: Rheumatology

## 2016-12-21 ENCOUNTER — Encounter: Payer: Self-pay | Admitting: Family Medicine

## 2016-12-21 ENCOUNTER — Ambulatory Visit (INDEPENDENT_AMBULATORY_CARE_PROVIDER_SITE_OTHER): Payer: 59 | Admitting: Family Medicine

## 2016-12-21 ENCOUNTER — Encounter: Payer: Self-pay | Admitting: Rheumatology

## 2016-12-21 VITALS — BP 154/109 | HR 81 | Resp 14 | Ht 66.0 in | Wt 154.0 lb

## 2016-12-21 VITALS — BP 155/100 | HR 90 | Wt 157.2 lb

## 2016-12-21 DIAGNOSIS — Z8659 Personal history of other mental and behavioral disorders: Secondary | ICD-10-CM

## 2016-12-21 DIAGNOSIS — F17200 Nicotine dependence, unspecified, uncomplicated: Secondary | ICD-10-CM | POA: Diagnosis not present

## 2016-12-21 DIAGNOSIS — Z79899 Other long term (current) drug therapy: Secondary | ICD-10-CM | POA: Diagnosis not present

## 2016-12-21 DIAGNOSIS — Z8619 Personal history of other infectious and parasitic diseases: Secondary | ICD-10-CM

## 2016-12-21 DIAGNOSIS — R768 Other specified abnormal immunological findings in serum: Secondary | ICD-10-CM | POA: Diagnosis not present

## 2016-12-21 DIAGNOSIS — L4050 Arthropathic psoriasis, unspecified: Secondary | ICD-10-CM | POA: Diagnosis not present

## 2016-12-21 DIAGNOSIS — R7612 Nonspecific reaction to cell mediated immunity measurement of gamma interferon antigen response without active tuberculosis: Secondary | ICD-10-CM | POA: Diagnosis not present

## 2016-12-21 DIAGNOSIS — I10 Essential (primary) hypertension: Secondary | ICD-10-CM

## 2016-12-21 DIAGNOSIS — Z8669 Personal history of other diseases of the nervous system and sense organs: Secondary | ICD-10-CM | POA: Diagnosis not present

## 2016-12-21 MED ORDER — ETANERCEPT 50 MG/ML ~~LOC~~ SOCT
50.0000 mg | Freq: Once | SUBCUTANEOUS | Status: AC
  Administered 2016-12-21: 50 mg via SUBCUTANEOUS

## 2016-12-21 MED ORDER — LISINOPRIL-HYDROCHLOROTHIAZIDE 10-12.5 MG PO TABS: 1.0000 | ORAL_TABLET | Freq: Every day | ORAL | 1 refills | Status: DC

## 2016-12-21 MED ORDER — ETANERCEPT 50 MG/ML ~~LOC~~ SOCT: 50.0000 mg | SUBCUTANEOUS | 0 refills | Status: DC

## 2016-12-21 NOTE — Telephone Encounter (Signed)
Patient advised she is scheduled for a follow up visit today. Patient is wanting to do her new start to Enbrel when she comes for her appointment. Patient advised that should be okay.

## 2016-12-21 NOTE — Progress Notes (Signed)
Patient in office today for initial Enbrel mini injection. Patient provided with Autotouch device and Enbrel mini sample. Taught patient proper technique and patient was able to demonstrate. Patient tolerate well and observed in office for 30 minutes after administration for adverse reactions. No adverse reactions noted.   Administrations This Visit    Etanercept SOCT 50 mg    Admin Date 12/21/2016 Action Given Dose 50 mg Route Subcutaneous Administered By Earnestine Mealing, CMA         Patient advised to go to PCP or Urgent Care in regards to her elevated blood pressure, per Dr. Estanislado Pandy.

## 2016-12-21 NOTE — Progress Notes (Signed)
   Subjective:    Patient ID: Valerie Delgado, female    DOB: [birth year], 51 y.o.   MRN: [id number]  HPI She is here for recheck on her blood pressure. She was seen recently by Dr. Morene Antu for further evaluation because of her blood pressure.   Review of Systems     Objective:   Physical Exam Alert and in no distress. Blood pressure is recorded. Review of the record indicates that she has had of elevated blood pressure for roughly 3 months.       Assessment & Plan:  Essential hypertension - Plan: lisinopril-hydrochlorothiazide (PRINZIDE,ZESTORETIC) 10-12.5 MG tablet Explained that since it is a sustained elevated blood pressure, it is worthwhile treating. I will place her on lisinopril/HCTZ. Discussed possible side effects of cough and swelling. She will call if she has either of these. Otherwise she will be rechecked in one month.

## 2016-12-21 NOTE — Telephone Encounter (Signed)
Patient needs to know what appt is for today. Originally scheduled as a follow up. Please call to advise.

## 2016-12-21 NOTE — Patient Instructions (Signed)
Standing Labs We placed an order today for your standing lab work.    Please come back and get your standing labs in 1 month and then every 3 months  We have open lab Monday through Friday from 8:30-11:30 AM and 1:30-4 PM at the office of Dr. Bo Merino.   The office is located at [address], [place], [place], [place] [address] No appointment is necessary.   Labs are drawn by [facility].  You may receive a bill from [facility] for your lab work. If you have any questions regarding directions or hours of operation,  please call [phone].

## 2016-12-27 ENCOUNTER — Telehealth: Payer: Self-pay

## 2016-12-27 MED ORDER — ETANERCEPT 50 MG/ML ~~LOC~~ SOCT: 50.0000 mg | SUBCUTANEOUS | 0 refills | Status: DC

## 2016-12-27 NOTE — Telephone Encounter (Addendum)
Patient called stating that Rx for Enbrel needs to be called in or faxed to [facility] at [phone].  Phone number for [facility] is [phone].  Stated that [facility] could not fill Rx.  Cb# [phone].  Thank You.  Please advise.

## 2016-12-27 NOTE — Addendum Note (Signed)
Addended by: Carole Binning on: 12/27/2016 09:48 AM   Modules accepted: Orders

## 2016-12-27 NOTE — Telephone Encounter (Signed)
Left message to advise patient sent Enbrel Mini prescription to [facility].

## 2017-01-16 ENCOUNTER — Encounter (INDEPENDENT_AMBULATORY_CARE_PROVIDER_SITE_OTHER): Payer: Self-pay | Admitting: Orthopaedic Surgery

## 2017-01-16 ENCOUNTER — Ambulatory Visit (INDEPENDENT_AMBULATORY_CARE_PROVIDER_SITE_OTHER): Payer: 59 | Admitting: Orthopaedic Surgery

## 2017-01-16 VITALS — BP 126/86 | HR 110 | Ht 66.0 in | Wt 157.0 lb

## 2017-01-16 DIAGNOSIS — M25551 Pain in right hip: Secondary | ICD-10-CM | POA: Diagnosis not present

## 2017-01-16 NOTE — Progress Notes (Signed)
Office Visit Note   Patient: Valerie Delgado           Date of Birth: Mar 24, 1965           MRN: [id number] Visit Date: 01/16/2017              Requested by: Girtha Rm, NP-C [place], [place] [address] PCP: Girtha Rm, NP-C   Assessment & Plan: Visit Diagnoses:  1. Pain in right hip     Plan: Patient requested repeat hip injection but the previous one only lasted for a day or so at most. I recommend proceeding with an MRI scan to make sure she does not have stress fracture or significant synovitis. She has a diagnosis of psoriatic arthritis and has recently been started on Enbrel. We'll see her back after the MRI and if there is no pathology such as a stress fracture or labral tear and simply synovitis of the hip we can consider repeat injection at that time. Gwenlyn Saran supplied she'll use it in her opposite left hand. Gait sequence was reviewed.  Follow-Up Instructions: No Follow-up on file.   Orders:  No orders of the defined types were placed in this encounter.  No orders of the defined types were placed in this encounter.     Procedures: No procedures performed   Clinical Data: No additional findings.   Subjective: Chief Complaint  Patient presents with  . Lower Back - Follow-up    HPI 51 year old female returns with ongoing problems with right groin pain and ambulation with limp. She has some pain at the lumbosacral junction that radiates into her right buttocks. No pain on the left side. She's noticed some pain tends to go down with numbness at the bottom of her foot. When I saw her on 12/18/2016 she had an intra-articular injection and afterwards could walk in the office and in the halls with no limp and no pain and felt great. She states that she did not really get relief for very long and had recurrence of her symptoms exactly as before she has problems putting her socks on has problems when she tries to cross her leg has to lift her leg with her  hand to get in and out of the car and has problems going up steps has trouble lifting her right leg up and goes up left foot first. She's had intermittent problems with this right groin pain and limping for a whole year but she states it tended to come and go and now is persistently painful and bothers her with daily activities. She sits when she works. She has not used a cane.  Review of Systems review of systems updated unchanged from 10/29 2018 other than as mentioned in history of present illness 14 point system reviewed.   Objective: Vital Signs: BP 126/86   Pulse (!) 110   Ht 5\' 6"  (1.676 m)   Wt 157 lb (71.2 kg)   BMI 25.34 kg/m   Physical Exam  Constitutional: She is oriented to person, place, and time. She appears well-developed.  HENT:  Head: Normocephalic.  Right Ear: External ear normal.  Left Ear: External ear normal.  Eyes: Pupils are equal, round, and reactive to light.  Neck: No tracheal deviation present. No thyromegaly present.  Cardiovascular: Normal rate.  Pulmonary/Chest: Effort normal.  Abdominal: Soft.  Neurological: She is alert and oriented to person, place, and time.  Skin: Skin is warm and dry.  Psychiatric: She has a normal mood  and affect. Her behavior is normal.    Ortho Exam patient ambulates with a slight Trendelenburg gait. She has internal rotation of only 20 on her right hip with severe pain and jumps with internal rotation. External rotation limited to 30. Knee and ankle jerk are intact. Ankle dorsiflexion plantar flexion is strong. Distal pulses are intact no rash of her exposed skin no pitting edema. No venous stasis changes. These reach full extension. She has pain with resisted hip flexion in the sitting position. Negative straight leg raising to 90. Negative popped a compression test. Tib-fib joint is normal. No sciatic notch tenderness on the right or left. Trochanteric bursa is nontender.  Specialty Comments:  No specialty comments  available.  Imaging: No results found.   PMFS History: Patient Active Problem List   Diagnosis Date Noted  . Inflammatory arthritis 11/13/2016  . Essential hypertension 09/19/2016  . History of anxiety and depression 09/19/2016  . History of Meniere's disease 09/19/2016  . History of hearing loss 09/19/2016  . History of ADHD 09/19/2016  . Dyspareunia, female 01/11/2016  . Routine general medical examination at a health care facility 01/11/2016  . Smoker 09/01/2015  . Anxiety and depression 09/01/2015  . Positive QuantiFERON-TB Gold test 09/01/2015  . History of herpes simplex infection 09/01/2015   Past Medical History:  Diagnosis Date  . Anxiety   . Depression   . Meniere's disease of left ear   . Psoriatic arthritis ([place])    Dr. Estanislado Pandy  . Tuberculosis    started treatment on 08/30/2015    Family History  Problem Relation Age of Onset  . Hypertension Mother   . Cancer Mother        breast  . Arthritis Mother   . Depression Mother   . Hypertension Father   . Hypertension Brother   . Stroke Brother     Past Surgical History:  Procedure Laterality Date  . CESAREAN SECTION     Social History   Occupational History  . Not on file  Tobacco Use  . Smoking status: Current Every Day Smoker    Packs/day: 0.50    Years: 30.00    Pack years: 15.00  . Smokeless tobacco: Never Used  Substance and Sexual Activity  . Alcohol use: Yes    Alcohol/week: 0.6 - 1.2 oz    Types: 1 - 2 Glasses of wine per week    Comment: 10 drinks per week.   . Drug use: No  . Sexual activity: Not on file    Comment: lives with her mother. 1 child age 13.

## 2017-01-16 NOTE — Addendum Note (Signed)
Addended byLaurann Montana on: 01/16/2017 08:51 AM   Modules accepted: Orders

## 2017-01-24 ENCOUNTER — Ambulatory Visit (INDEPENDENT_AMBULATORY_CARE_PROVIDER_SITE_OTHER): Payer: 59 | Admitting: Orthopaedic Surgery

## 2017-01-25 ENCOUNTER — Other Ambulatory Visit: Payer: 59

## 2017-01-25 ENCOUNTER — Ambulatory Visit (INDEPENDENT_AMBULATORY_CARE_PROVIDER_SITE_OTHER): Payer: 59 | Admitting: Surgery

## 2017-01-26 ENCOUNTER — Telehealth: Payer: Self-pay | Admitting: Rheumatology

## 2017-01-26 NOTE — Telephone Encounter (Signed)
[facility] calling for new rx for 1 Enbrel Mini. Patient had a defective one, and they need an rx to get her a free replacement. [phone]

## 2017-02-01 MED ORDER — ETANERCEPT 50 MG/ML ~~LOC~~ SOCT
50.0000 mg | SUBCUTANEOUS | 0 refills | Status: DC
Start: 2017-02-01 — End: 2018-04-10

## 2017-02-01 NOTE — Telephone Encounter (Signed)
Last Visit: 12/21/16 Next visit: 02/06/17 Labs: 10/17/16 Stable TB Gold: 12/18/16 chest x-ray normal  Okay to send prescription for replacement Enbrel mini  Prescription faxed

## 2017-02-02 ENCOUNTER — Other Ambulatory Visit (INDEPENDENT_AMBULATORY_CARE_PROVIDER_SITE_OTHER): Payer: Self-pay | Admitting: Radiology

## 2017-02-02 ENCOUNTER — Telehealth: Payer: Self-pay | Admitting: Rheumatology

## 2017-02-02 DIAGNOSIS — M25551 Pain in right hip: Secondary | ICD-10-CM

## 2017-02-02 NOTE — Telephone Encounter (Signed)
Candace from [facility] left a message stating that the patient had reported to them a defective Enbrel mini and that they are replacing it for her, but they are needing a new prescription sent to them.  Fax#[phone] or CB#[phone].  Thank you.

## 2017-02-02 NOTE — Telephone Encounter (Signed)
Spoke with pharmacy and advised we had faxed prescription yesterday. Provided verbal authorization for replacement.

## 2017-02-06 ENCOUNTER — Ambulatory Visit (INDEPENDENT_AMBULATORY_CARE_PROVIDER_SITE_OTHER): Payer: 59 | Admitting: Orthopaedic Surgery

## 2017-02-09 ENCOUNTER — Telehealth (INDEPENDENT_AMBULATORY_CARE_PROVIDER_SITE_OTHER): Payer: Self-pay | Admitting: Orthopaedic Surgery

## 2017-02-09 NOTE — Telephone Encounter (Signed)
Proceed with MRI hip thanks ROV after scan.

## 2017-02-09 NOTE — Telephone Encounter (Signed)
Would like cortisone injection in hip today. [name] patient. CB # [phone]

## 2017-02-09 NOTE — Telephone Encounter (Signed)
Patient called requesting repeat hip injection. Would you like for me to put in order?  She had one in the office that did not provide relief per office note.

## 2017-02-09 NOTE — Telephone Encounter (Signed)
I attempted to call patient to advise, Dr. Lorin Mercy wants to try for MRI approval again. Since injection did not help previously, he does not think it will now. The mailbox is full and not accepting new messages.

## 2017-02-14 NOTE — Telephone Encounter (Signed)
Dr. Lorin Mercy would like to try approval for MRI again please. Patient is not getting any better after some conservative treatment. Thanks.

## 2017-02-23 NOTE — Telephone Encounter (Signed)
Pt was authorized and I called Miriam at [facility] imaging she will call pt to schedule appt

## 2017-02-26 ENCOUNTER — Ambulatory Visit: Payer: 59 | Attending: Family Medicine

## 2017-02-26 DIAGNOSIS — R293 Abnormal posture: Secondary | ICD-10-CM | POA: Diagnosis present

## 2017-02-26 DIAGNOSIS — R262 Difficulty in walking, not elsewhere classified: Secondary | ICD-10-CM | POA: Insufficient documentation

## 2017-02-26 DIAGNOSIS — M25551 Pain in right hip: Secondary | ICD-10-CM | POA: Insufficient documentation

## 2017-02-26 DIAGNOSIS — M25651 Stiffness of right hip, not elsewhere classified: Secondary | ICD-10-CM | POA: Insufficient documentation

## 2017-02-26 NOTE — Therapy (Signed)
[place], [place], [address] Phone: [phone]   Fax:  [phone]  Physical Therapy Evaluation  Patient Details  Name: Valerie Delgado MRN: [id number] Date of Birth: [birth year] Referring Provider: Harland Dingwall   Encounter Date: 02/26/2017  PT End of Session - 02/26/17 0738    Visit Number  1    Number of Visits  12    Date for PT Re-Evaluation  04/06/17    PT Start Time  0700    PT Stop Time  0745    PT Time Calculation (min)  45 min    Activity Tolerance  Patient tolerated treatment well;No increased pain    Behavior During Therapy  WFL for tasks assessed/performed       Past Medical History:  Diagnosis Date  . Anxiety   . Depression   . Meniere's disease of left ear   . Psoriatic arthritis ([place])    Dr. Estanislado Pandy  . Tuberculosis    started treatment on 08/30/2015    Past Surgical History:  Procedure Laterality Date  . CESAREAN SECTION      There were no vitals filed for this visit.   Subjective Assessment - 02/26/17 0705    Subjective  She reports RT hip pain and psoratic arthritis.  Hip pain started from groing to buttocks.  Xrays with RTR OA. Was to have MRI but insurance wanted PT before MRI.   Prior to Nov hip pain was intermittant.    Now pain constant     Pertinent History  Psoriatic arthritis    Limitations  Standing;Sitting;Walking    How long can you sit comfortably?  60 min or less  getting out of chair paintful     How long can you stand comfortably?  Has to fidgit     How long can you walk comfortably?  Pt not clear.     Diagnostic tests  Xray: spurs and OA    Patient Stated Goals  to feel better.  Not worry about when she shops or have limtis    Currently in Pain?  Yes    Pain Score  4     Pain Location  Hip    Pain Orientation  Right    Pain Descriptors / Indicators  Burning;Constant pinch    Pain Type  Chronic pain    Pain Radiating Towards  foot tingles at times    Pain Onset  More than a month ago    Pain Frequency  Constant    Aggravating Factors   All activity     Pain Relieving Factors  Heat, meds     Multiple Pain Sites  -- Yes due to psotraitic arth, not to be addressed          [facility] PT Assessment - 02/26/17 0001      Assessment   Medical Diagnosis  RT hip pain     Referring Provider  Harland Dingwall    Onset Date/Surgical Date  -- November 2018    Prior Therapy  No      Precautions   Precautions  None      Restrictions   Weight Bearing Restrictions  No      Balance Screen   Has the patient fallen in the past 6 months  No      Prior Function   Level of Independence  Independent      Cognition   Overall Cognitive Status  Within Functional Limits for tasks assessed  Posture/Postural Control   Posture/Postural Control  --    Posture Comments  increased lumbar lodosis and incr thoraco lumbar flexion with trunk flexion      ROM / Strength   AROM / PROM / Strength  PROM;Strength;AROM      AROM   AROM Assessment Site  Lumbar    Lumbar Flexion  75    Lumbar Extension  30    Lumbar - Right Side Bend  30    Lumbar - Left Side Bend  30      PROM   PROM Assessment Site  Hip    Right/Left Hip  Left;Right    Right Hip External Rotation   20    Right Hip Internal Rotation   25    Left Hip Extension  23    Left Hip Flexion  110    Left Hip External Rotation   43    Left Hip Internal Rotation   45    Left Hip ABduction  40      Strength   Overall Strength Comments  All WNL in hips and LE      Right Hip   Right Hip Extension  0    Right Hip Flexion  90    Right Hip ABduction  15      Flexibility   Soft Tissue Assessment /Muscle Length  yes    Hamstrings  right 50   left 75    Quadriceps         Ambulation/Gait   Gait Comments  Antalgic gait on RT LE             Objective measurements completed on examination: See above findings.              PT Education - 02/26/17 0704    Education provided   Yes    Education Details  POC, HEP    Person(s) Educated  Patient    Methods  Explanation    Comprehension  Verbalized understanding       PT Short Term Goals - 02/26/17 0749      PT SHORT TERM GOAL #1   Title  She wil be independent with initial HEP    Time  3    Period  Weeks    Status  New      PT SHORT TERM GOAL #2   Title  She will report pain with wlaking decreased 25% or more    Time  3    Period  Weeks    Status  New      PT SHORT TERM GOAL #3   Title  She will improve RT hip flexion to 100 degrees and extension to 10 degrees to decr pain    Time  3    Period  Weeks    Status  New        PT Long Term Goals - 02/26/17 0750      PT LONG TERM GOAL #1   Title  She will be independent with all hEP issued .     Time  6    Period  Weeks    Status  New      PT LONG TERM GOAL #2   Title  She will walk intermittantly without pain    Time  6    Period  Weeks    Status  New      PT LONG TERM GOAL #3   Title  She will report no pain  with sitting for 45 min or more without fidgiting    Time  6    Period  Weeks    Status  New      PT LONG TERM GOAL #4   Title  She will report able to shop at store without limting time due to pain    Time  6    Period  Weeks    Status  New      PT LONG TERM GOAL #5   Title  She will report pain getting out of chair decrased 50% or more    Time  6    Period  Weeks    Status  New             Plan - 02/26/17 0626    Clinical Impression Statement  Valerie Delgado presents with complaint of RT hip pain starting constant in Novemeber of 2018 though she had intermittant pain over past years in RT hip.  She is limited with activity on feet and cannot lye on RT side.  She has other areas of pain due to psoriatic arthritis.      Clinical Presentation  Stable    Clinical Decision Making  Low    Rehab Potential  Fair    PT Frequency  2x / week    PT Duration  6 weeks    PT Treatment/Interventions  Cryotherapy;Iontophoresis 4mg /ml  Dexamethasone;Moist Heat;Ultrasound;Therapeutic exercise;Manual techniques;Taping;Patient/family education;Gait training    PT Next Visit Plan  Manual , progress HEP with ROM /stretcing , modalities.     PT Home Exercise Plan  bridge, hip adduction squeezed and blue and clams    Consulted and Agree with Plan of Care  Patient       Patient will benefit from skilled therapeutic intervention in order to improve the following deficits and impairments:  Pain, Postural dysfunction, Decreased activity tolerance, Decreased range of motion, Difficulty walking  Visit Diagnosis: Pain in right hip  Stiffness of right hip, not elsewhere classified  Abnormal posture  Difficulty in walking, not elsewhere classified     Problem List Patient Active Problem List   Diagnosis Date Noted  . Inflammatory arthritis 11/13/2016  . Essential hypertension 09/19/2016  . History of anxiety and depression 09/19/2016  . History of Meniere's disease 09/19/2016  . History of hearing loss 09/19/2016  . History of ADHD 09/19/2016  . Dyspareunia, female 01/11/2016  . Routine general medical examination at a health care facility 01/11/2016  . Smoker 09/01/2015  . Anxiety and depression 09/01/2015  . Positive QuantiFERON-TB Gold test 09/01/2015  . History of herpes simplex infection 09/01/2015    Darrel Hoover  PT 02/26/2017, 7:53 AM  [facility] [address] [place], [place], [address] Phone: [phone]   Fax:  [phone]  Name: Valerie Delgado MRN: [id number] Date of Birth: 05-10-1965

## 2017-02-26 NOTE — Patient Instructions (Signed)
Issued bridge, clams and adductor squeezes 10-20 reps 1-2x/day movement to comfort.

## 2017-02-27 ENCOUNTER — Other Ambulatory Visit: Payer: Self-pay | Admitting: Family Medicine

## 2017-02-27 DIAGNOSIS — I10 Essential (primary) hypertension: Secondary | ICD-10-CM

## 2017-03-06 ENCOUNTER — Ambulatory Visit: Payer: 59

## 2017-03-06 DIAGNOSIS — R293 Abnormal posture: Secondary | ICD-10-CM

## 2017-03-06 DIAGNOSIS — M25551 Pain in right hip: Secondary | ICD-10-CM | POA: Diagnosis not present

## 2017-03-06 DIAGNOSIS — M25651 Stiffness of right hip, not elsewhere classified: Secondary | ICD-10-CM

## 2017-03-06 DIAGNOSIS — R262 Difficulty in walking, not elsewhere classified: Secondary | ICD-10-CM

## 2017-03-06 NOTE — Therapy (Signed)
[place], [place], [address] Phone: [phone]   Fax:  [phone]  Physical Therapy Treatment  Patient Details  Name: Valerie Delgado MRN: [id number] Date of Birth: [birth year] Referring Provider: Rodell Perna  MD   Encounter Date: 03/06/2017  PT End of Session - 03/06/17 0707    Visit Number  2    Number of Visits  12    Date for PT Re-Evaluation  04/06/17    PT Start Time  0700    PT Stop Time  0750    PT Time Calculation (min)  50 min    Activity Tolerance  Patient tolerated treatment well;No increased pain    Behavior During Therapy  WFL for tasks assessed/performed       Past Medical History:  Diagnosis Date  . Anxiety   . Depression   . Meniere's disease of left ear   . Psoriatic arthritis ([place])    Dr. Estanislado Pandy  . Tuberculosis    started treatment on 08/30/2015    Past Surgical History:  Procedure Laterality Date  . CESAREAN SECTION      There were no vitals filed for this visit.  Subjective Assessment - 03/06/17 0711    Subjective  Hurting due to moving firewood this weekend    Currently in Pain?  Yes    Pain Score  7     Pain Location  Hip    Pain Orientation  Right    Pain Descriptors / Indicators  Burning;Constant    Pain Type  Chronic pain    Pain Onset  More than a month ago    Pain Frequency  Constant    Aggravating Factors   activity    Pain Relieving Factors  heat , meds                      [facility] Adult PT Treatment/Exercise - 03/06/17 0001      Exercises   Exercises  Knee/Hip      Knee/Hip Exercises: Stretches   Other Knee/Hip Stretches  fig 4 with glut squeezes  x 12      Knee/Hip Exercises: Aerobic   Stationary Bike  L2 5 min      Knee/Hip Exercises: Supine   Bridges  15 reps    Other Supine Knee/Hip Exercises  clam with blue band and ball squeeze x 15 then 10 reps with abdominal set      Knee/Hip Exercises: Prone   Straight Leg Raises   Right;Left;10 reps    Straight Leg Raises Limitations  then 10 with ab set    Other Prone Exercises  PPT with pubic scoop x 10       Modalities   Modalities  Moist Heat      Moist Heat Therapy   Number Minutes Moist Heat  12 Minutes    Moist Heat Location  Hip      Manual Therapy   Manual Therapy  Manual Traction;Joint mobilization    Joint Mobilization  AP glides 2-3 gr 50 reps     Manual Traction  100 reps long axis pull RT hip                PT Short Term Goals - 02/26/17 0749      PT SHORT TERM GOAL #1   Title  She wil be independent with initial HEP    Time  3    Period  Weeks    Status  New      PT SHORT TERM GOAL #2   Title  She will report pain with wlaking decreased 25% or more    Time  3    Period  Weeks    Status  New      PT SHORT TERM GOAL #3   Title  She will improve RT hip flexion to 100 degrees and extension to 10 degrees to decr pain    Time  3    Period  Weeks    Status  New        PT Long Term Goals - 02/26/17 0750      PT LONG TERM GOAL #1   Title  She will be independent with all hEP issued .     Time  6    Period  Weeks    Status  New      PT LONG TERM GOAL #2   Title  She will walk intermittantly without pain    Time  6    Period  Weeks    Status  New      PT LONG TERM GOAL #3   Title  She will report no pain with sitting for 45 min or more without fidgiting    Time  6    Period  Weeks    Status  New      PT LONG TERM GOAL #4   Title  She will report able to shop at store without limting time due to pain    Time  6    Period  Weeks    Status  New      PT LONG TERM GOAL #5   Title  She will report pain getting out of chair decrased 50% or more    Time  6    Period  Weeks    Status  New            Plan - 03/06/17 0708    Clinical Impression Statement  No changes . More pain due to working with firewood over week end due to power outage. Needs cues with exercises. PRone extension give incr pain    PT  Treatment/Interventions  Cryotherapy;Iontophoresis 4mg /ml Dexamethasone;Moist Heat;Ultrasound;Therapeutic exercise;Manual techniques;Taping;Patient/family education;Gait training    PT Next Visit Plan  Manual , progress HEP with ROM /stretcing , modalities.     PT Home Exercise Plan  bridge, hip adduction squeezed and blue band clams    Consulted and Agree with Plan of Care  Patient       Patient will benefit from skilled therapeutic intervention in order to improve the following deficits and impairments:  Pain, Postural dysfunction, Decreased activity tolerance, Decreased range of motion, Difficulty walking  Visit Diagnosis: Pain in right hip  Stiffness of right hip, not elsewhere classified  Abnormal posture  Difficulty in walking, not elsewhere classified     Problem List Patient Active Problem List   Diagnosis Date Noted  . Inflammatory arthritis 11/13/2016  . Essential hypertension 09/19/2016  . History of anxiety and depression 09/19/2016  . History of Meniere's disease 09/19/2016  . History of hearing loss 09/19/2016  . History of ADHD 09/19/2016  . Dyspareunia, female 01/11/2016  . Routine general medical examination at a health care facility 01/11/2016  . Smoker 09/01/2015  . Anxiety and depression 09/01/2015  . Positive QuantiFERON-TB Gold test 09/01/2015  . History of herpes simplex infection 09/01/2015    Darrel Hoover  PT 03/06/2017, 7:41 AM  [place]  [facility] [address] [place], [place], [address] Phone: [phone]   Fax:  [phone]  Name: Valerie Delgado MRN: [id number] Date of Birth: 31-Aug-1965

## 2017-03-13 ENCOUNTER — Ambulatory Visit: Payer: 59

## 2017-03-13 DIAGNOSIS — M25551 Pain in right hip: Secondary | ICD-10-CM | POA: Diagnosis not present

## 2017-03-13 DIAGNOSIS — R293 Abnormal posture: Secondary | ICD-10-CM

## 2017-03-13 DIAGNOSIS — R262 Difficulty in walking, not elsewhere classified: Secondary | ICD-10-CM

## 2017-03-13 DIAGNOSIS — M25651 Stiffness of right hip, not elsewhere classified: Secondary | ICD-10-CM

## 2017-03-13 NOTE — Therapy (Signed)
[place], [place], [address] Phone: [phone]   Fax:  [phone]  Physical Therapy Treatment  Patient Details  Name: Valerie Delgado MRN: [id number] Date of Birth: Oct 24, 1965 Referring Provider: Rodell Perna  MD   Encounter Date: 03/13/2017  PT End of Session - 03/13/17 0658    Visit Number  3    Number of Visits  12    Date for PT Re-Evaluation  04/06/17    PT Start Time  0658    PT Stop Time  0740    PT Time Calculation (min)  42 min    Activity Tolerance  Patient tolerated treatment well;No increased pain    Behavior During Therapy  WFL for tasks assessed/performed       Past Medical History:  Diagnosis Date  . Anxiety   . Depression   . Meniere's disease of left ear   . Psoriatic arthritis ([place])    Dr. Estanislado Pandy  . Tuberculosis    started treatment on 08/30/2015    Past Surgical History:  Procedure Laterality Date  . CESAREAN SECTION      There were no vitals filed for this visit.  Subjective Assessment - 03/13/17 0705    Subjective  sore and stiff . Cold doesn't help.     Currently in Pain?  Yes    Pain Score  4     Pain Location  Hip    Pain Orientation  Right    Pain Descriptors / Indicators  Burning;Constant    Pain Type  Chronic pain    Pain Onset  More than a month ago    Pain Frequency  Constant    Aggravating Factors   activity    Pain Relieving Factors  heat , meds         [facility] PT Assessment - 03/13/17 0001      Right Hip   Right Hip Flexion  106    Right Hip ABduction  20      Flexibility   Hamstrings  right  80 degrees                  [facility] Adult PT Treatment/Exercise - 03/13/17 0001      Self-Care   Self-Care  Other Self-Care Comments    Other Self-Care Comments   use of tennis ball for RT gluteal STW      Knee/Hip Exercises: Stretches   Other Knee/Hip Stretches  fig 4 with glut squeezes  x 12    Other Knee/Hip Stretches  knee to chest  2x30, standing abduction stretch RT 45 sec      Knee/Hip Exercises: Aerobic   Stationary Bike  L2 5 min      Knee/Hip Exercises: Supine   Bridges  15 reps    Other Supine Knee/Hip Exercises  PPT with clam with blue band and ball squeeze x 20       Manual Therapy   Manual Therapy  Soft tissue mobilization    Joint Mobilization  AP glides 2-3 gr 50 reps     Soft tissue mobilization  RT gluteals with rotation of RT hip    Manual Traction  100 reps long axis pull RT hip , discussed how to instruct someone at home to perform.                PT Short Term Goals - 03/13/17 1751      PT SHORT TERM GOAL #1   Title  She wil be independent with initial HEP    Status  On-going      PT SHORT TERM GOAL #2   Title  She will report pain with wlaking decreased 25% or more    Status  On-going      PT SHORT TERM GOAL #3   Title  She will improve RT hip flexion to 100 degrees and extension to 10 degrees to decr pain    Status  Achieved        PT Long Term Goals - 03/13/17 9629      PT LONG TERM GOAL #1   Title  She will be independent with all hEP issued .     Status  On-going      PT LONG TERM GOAL #2   Title  She will walk intermittantly without pain    Status  On-going      PT LONG TERM GOAL #3   Title  She will report no pain with sitting for 45 min or more without fidgiting    Status  On-going      PT LONG TERM GOAL #4   Title  She will report able to shop at store without limting time due to pain    Status  On-going      PT LONG TERM GOAL #5   Title  She will report pain getting out of chair decrased 50% or more    Status  On-going            Plan - 03/13/17 5284    Clinical Impression Statement  PAin still variable but ROM significnatly improved . Issued self stretching options to keep ROM gained.     PT Treatment/Interventions  Cryotherapy;Iontophoresis 4mg /ml Dexamethasone;Moist Heat;Ultrasound;Therapeutic exercise;Manual techniques;Taping;Patient/family  education;Gait training    PT Next Visit Plan  Manual , progress HEP with ROM /stretcing/ strength , modalities.     PT Home Exercise Plan  bridge, hip adduction squeezed and blue band clams  hip flexion and abduction stretching    Consulted and Agree with Plan of Care  Patient       Patient will benefit from skilled therapeutic intervention in order to improve the following deficits and impairments:  Pain, Postural dysfunction, Decreased activity tolerance, Decreased range of motion, Difficulty walking  Visit Diagnosis: Pain in right hip  Stiffness of right hip, not elsewhere classified  Difficulty in walking, not elsewhere classified  Abnormal posture     Problem List Patient Active Problem List   Diagnosis Date Noted  . Inflammatory arthritis 11/13/2016  . Essential hypertension 09/19/2016  . History of anxiety and depression 09/19/2016  . History of Meniere's disease 09/19/2016  . History of hearing loss 09/19/2016  . History of ADHD 09/19/2016  . Dyspareunia, female 01/11/2016  . Routine general medical examination at a health care facility 01/11/2016  . Smoker 09/01/2015  . Anxiety and depression 09/01/2015  . Positive QuantiFERON-TB Gold test 09/01/2015  . History of herpes simplex infection 09/01/2015    Darrel Hoover  PT 03/13/2017, 7:44 AM  [facility] [address] [place], [place], [address] Phone: [phone]   Fax:  [phone]  Name: Valerie Delgado MRN: [id number] Date of Birth: 01/06/1966

## 2017-03-20 ENCOUNTER — Ambulatory Visit: Payer: 59

## 2017-03-27 ENCOUNTER — Ambulatory Visit: Payer: 59 | Attending: Family Medicine

## 2017-03-27 ENCOUNTER — Telehealth: Payer: Self-pay | Admitting: Physical Therapy

## 2017-03-27 DIAGNOSIS — M25551 Pain in right hip: Secondary | ICD-10-CM | POA: Insufficient documentation

## 2017-03-27 DIAGNOSIS — R262 Difficulty in walking, not elsewhere classified: Secondary | ICD-10-CM | POA: Insufficient documentation

## 2017-03-27 DIAGNOSIS — R293 Abnormal posture: Secondary | ICD-10-CM | POA: Insufficient documentation

## 2017-03-27 DIAGNOSIS — M25651 Stiffness of right hip, not elsewhere classified: Secondary | ICD-10-CM | POA: Insufficient documentation

## 2017-03-27 NOTE — Telephone Encounter (Signed)
Valerie Delgado was called but her mailbox was full and could not leave a message

## 2017-03-29 ENCOUNTER — Ambulatory Visit: Payer: 59

## 2017-03-29 NOTE — Therapy (Signed)
[place], [place], [address] Phone: [phone]   Fax:  [phone]  Physical Therapy Treatment  Patient Details  Name: Valerie Delgado MRN: [id number] Date of Birth: 21-Oct-1965 Referring Provider: Rodell Perna  MD   Encounter Date: 03/29/2017  PT End of Session - 03/29/17 0705    PT Start Time  0656    PT Stop Time  0704    PT Time Calculation (min)  8 min    Behavior During Therapy  [facility] for tasks assessed/performed       Past Medical History:  Diagnosis Date  . Anxiety   . Depression   . Meniere's disease of left ear   . Psoriatic arthritis ([place])    Dr. Estanislado Pandy  . Tuberculosis    started treatment on 08/30/2015    Past Surgical History:  Procedure Laterality Date  . CESAREAN SECTION      There were no vitals filed for this visit.  Subjective Assessment - 03/29/17 0704    Subjective  I've had a cold lately.  Why I haven't come in and I block calls I don't know so did not pick up call.   Pain with getting out of chair.                                 PT Short Term Goals - 03/13/17 7253      PT SHORT TERM GOAL #1   Title  She wil be independent with initial HEP    Status  On-going      PT SHORT TERM GOAL #2   Title  She will report pain with wlaking decreased 25% or more    Status  On-going      PT SHORT TERM GOAL #3   Title  She will improve RT hip flexion to 100 degrees and extension to 10 degrees to decr pain    Status  Achieved        PT Long Term Goals - 03/13/17 6644      PT LONG TERM GOAL #1   Title  She will be independent with all hEP issued .     Status  On-going      PT LONG TERM GOAL #2   Title  She will walk intermittantly without pain    Status  On-going      PT LONG TERM GOAL #3   Title  She will report no pain with sitting for 45 min or more without fidgiting    Status  On-going      PT LONG TERM GOAL #4   Title  She will report  able to shop at store without limting time due to pain    Status  On-going      PT LONG TERM GOAL #5   Title  She will report pain getting out of chair decrased 50% or more    Status  On-going            Plan - 03/29/17 0706    Clinical Impression Statement  Ms Guidry has a cold and has not been to PT recently. She was showing symptoms of a cold so she was no treated and is to come to PT next week if symptoms improve/resolve.    PT Treatment/Interventions  Cryotherapy;Iontophoresis 4mg /ml Dexamethasone;Moist Heat;Ultrasound;Therapeutic exercise;Manual techniques;Taping;Patient/family education;Gait training    PT Next Visit Plan  Manual , progress HEP with ROM /  stretcing/ strength , modalities.     PT Home Exercise Plan  bridge, hip adduction squeezed and blue band clams  hip flexion and abduction stretching    Consulted and Agree with Plan of Care  Patient       Patient will benefit from skilled therapeutic intervention in order to improve the following deficits and impairments:  Pain, Postural dysfunction, Decreased activity tolerance, Decreased range of motion, Difficulty walking  Visit Diagnosis: Pain in right hip  Stiffness of right hip, not elsewhere classified  Difficulty in walking, not elsewhere classified     Problem List Patient Active Problem List   Diagnosis Date Noted  . Inflammatory arthritis 11/13/2016  . Essential hypertension 09/19/2016  . History of anxiety and depression 09/19/2016  . History of Meniere's disease 09/19/2016  . History of hearing loss 09/19/2016  . History of ADHD 09/19/2016  . Dyspareunia, female 01/11/2016  . Routine general medical examination at a health care facility 01/11/2016  . Smoker 09/01/2015  . Anxiety and depression 09/01/2015  . Positive QuantiFERON-TB Gold test 09/01/2015  . History of herpes simplex infection 09/01/2015    Darrel Hoover  PT 03/29/2017, 7:08 AM  [facility] [address] [place], [place], [address] Phone: [phone]   Fax:  [phone]  Name: Valerie Delgado MRN: [id number] Date of Birth: August 07, 1965

## 2017-03-29 NOTE — Progress Notes (Signed)
Subjective:    Patient ID: Valerie Delgado, female    DOB: 1965-05-08, 52 y.o.   MRN: [id number]  HPI Chief Complaint  Patient presents with  . cpe    cpe fasting. wants a colonoscopy, wants ENT due to sinus infection   She is here for a complete physical exam.   Anxiety and depression- she is tearful today, states she feels overwhelmed with her health problems and her relationship. States her boyfriend is not supportive and does not think she is really having pain or problems. States they may break up soon.  She is taking Cymbalta and Xanax prescribed by her psychiatrist. Is not seeing a counselor. No SI or HI.   HTN- states she lost her blood pressure medication 3 weeks ago.   She is taking Enbrel for psoriatic arthritis and this is managed by her rheumatologist.   History of hearing loss and this seems to be worsening. States tinnitus and hearing loss is related to meniere's. She has seen [facility] in the past. She also complains of recurrent sinusitis and difficulty breathing out of her right nare. States it feels occluded.   Other providers: Dr. Estanislado Pandy - rheumatologist Dr. Lorin Mercy- orthopedist. Psychiatrist- Dr. Elise[facility].   Going to PT for right hip pain. Seeing Dr. Lorin Mercy for this.   She had latent TB diagnosed prior to starting treatment for psoriatic arthritis.  [facility].    Social history: Lives with , works as an Passenger transport manager for a company.  0.75 pack per day.  Diet: fairly healthy  Excerise: none   Immunizations: Tdap given. Flu shot- needs this   Health maintenance:  Mammogram: 2017  Colonoscopy: never. Would like to get this done.  Last Gynecological Exam: 2017 PAP SMEAR- negative HPV. Due in 2020 Last Menstrual cycle: 10 years ago   Last Dental Exam: July 2018 Last Eye Exam: never been   Depression screen [facility] 2/9 03/30/2017 01/11/2016  Decreased Interest 3 0  Down, Depressed, Hopeless 3 0  PHQ - 2 Score 6 0    Altered sleeping 3 -  Tired, decreased energy 3 -  Change in appetite 3 -  Feeling bad or failure about yourself  3 -  Trouble concentrating 3 -  Moving slowly or fidgety/restless 0 -  Suicidal thoughts 0 -  PHQ-9 Score 21 -  Difficult doing work/chores Somewhat difficult -     Wears seatbelt always, uses sunscreen, smoke detectors in home and functioning, does not text while driving and feels safe in home environment.   Reviewed allergies, medications, past medical, surgical, family, and social history.    Review of Systems Review of Systems Constitutional: -fever, -chills, -sweats, -unexpected weight change,-fatigue ENT: -runny nose, -ear pain, -sore throat Cardiology:  -chest pain, -palpitations, -edema Respiratory: -cough, -shortness of breath, -wheezing Gastroenterology: -abdominal pain, -nausea, -vomiting, -diarrhea, -constipation  Hematology: -bleeding or bruising problems Musculoskeletal: -arthralgias, -myalgias, -joint swelling, -back pain Ophthalmology: -vision changes Urology: -dysuria, -difficulty urinating, -hematuria, -urinary frequency, -urgency Neurology: -headache, -weakness, -tingling, -numbness       Objective:   Physical Exam BP 140/90   Pulse 98   Ht 5\' 6"  (1.676 m)   Wt 160 lb 3.2 oz (72.7 kg)   BMI 25.86 kg/m   General Appearance:    Alert, cooperative, no distress, appears stated age  Head:    Normocephalic, without obvious abnormality, atraumatic  Eyes:    PERRL, conjunctiva/corneas clear, EOM's intact, fundi  benign  Ears:    Normal TM's and external ear canals. Decreased gross hearing L>R.   Nose:   Nares with edema, R>L, erythema, thick mucus on left, maxillary sinus tenderness  Throat:   Lips, mucosa, and tongue normal; teeth and gums normal  Neck:   Supple, no lymphadenopathy;  thyroid:  no   enlargement/tenderness/nodules; no carotid   bruit or JVD  Back:    Spine nontender, no curvature, ROM normal, no CVA     tenderness  Lungs:      Clear to auscultation bilaterally without wheezes, rales or     ronchi; respirations unlabored  Chest Wall:    No tenderness or deformity   Heart:    Regular rate and rhythm, S1 and S2 normal, no murmur, rub   or gallop  Breast Exam:    Deferred. Mammogram ordered.   Abdomen:     Soft, non-tender, nondistended, normoactive bowel sounds,    no masses, no hepatosplenomegaly. Purplish bruise to her right lower abdomen and side (from recent fall). Healing.   Genitalia:    Deferred. Pap smear up to date.   Rectal:    Not done. GI referral.   Extremities:   No clubbing, cyanosis or edema  Pulses:   2+ and symmetric all extremities  Skin:   Skin color, texture, turgor normal, no rashes or lesions  Lymph nodes:   Cervical, supraclavicular, and axillary nodes normal  Neurologic:   CNII-XII intact, normal strength, sensation and gait; reflexes 2+ and symmetric throughout          Psych:   Normal mood, affect, hygiene and grooming.    Urinalysis dipstick: negative       Assessment & Plan:  Routine general medical examination at a health care facility - Plan: CBC with Differential/Platelet, Comprehensive metabolic panel, POCT Urinalysis DIP (Proadvantage Device), TSH, Lipid panel  Essential hypertension - Plan: lisinopril-hydrochlorothiazide (PRINZIDE,ZESTORETIC) 10-12.5 MG tablet, CBC with Differential/Platelet, Comprehensive metabolic panel  Smoker  Anxiety and depression  Acute sinusitis with symptoms > 10 days - Plan: azithromycin (ZITHROMAX Z-PAK) 250 MG tablet  History of Meniere's disease - Plan: Ambulatory referral to ENT  Estrogen deficiency - Plan: DG Bone Density  Screen for colon cancer - Plan: Ambulatory referral to Gastroenterology  Screening for breast cancer - Plan: MM DIGITAL SCREENING BILATERAL  Needs flu shot - Plan: Flu Vaccine QUAD 36+ mos IM  Decreased visual acuity  Bilateral hearing loss, unspecified hearing loss type - Plan: Ambulatory referral to  ENT  Recurrent sinusitis - Plan: Ambulatory referral to ENT  Need for hepatitis C screening test - Plan: Hepatitis C antibody  Psoriatic arthritis ([place])  TB (tuberculosis), treated  She is overall doing well. She does have some current issues with depression. No suicidal thoughts. She is taking medication for this and seeing her psychiatrist. Has not been seeing a counseling and I do recommend counseling. A list of counselors was provided. She will consider this.  Advised her to cut back on daily alcohol use as this can be affecting her mood.  She expressed her concern about a negative relationship with her boyfriend. Dicussed evaluation whether this relationship is going to be good for her in the long run. No sign of domestic violence.  HTN- she has been out of her BP medication and did not let me know. She will start back on BP medication and check her BP at home. She will let me know if her readings are not getting back in goal  range. Check CBC, CMP.  History of meneires, hearing loss and recurrent sinusitis. She has seen [facility] in the past and would like to return due to worsening hearing and tinnitus. We did call and schedule this for her.  GI referral for her first screening colonoscopy.  Mammogram ordered.  Estrogen deficiency- LMP more than 10 years ago and no contraception in more than 5 years. Bone density ordered.  She will call and schedule mammogram and DEXA.  She does appear to have a sinus infection and her right nare is basically occluded. Per patient request, Z-pack was prescribed as this has worked for her in the past. She will address this with ENT as well.  One time hepatitis C screening done.  TB [facility] by [facility].  She will continue seeing her specialists including her rheumatologist, psychiatrist.  Would like to establish with an eye doctor- her vision is decreased. A list was provided.  Follow up pending labs or in 3 months  for HTN and other chronic conditions.

## 2017-03-30 ENCOUNTER — Encounter: Payer: Self-pay | Admitting: Family Medicine

## 2017-03-30 ENCOUNTER — Ambulatory Visit (INDEPENDENT_AMBULATORY_CARE_PROVIDER_SITE_OTHER): Payer: 59 | Admitting: Family Medicine

## 2017-03-30 VITALS — BP 140/90 | HR 98 | Ht 66.0 in | Wt 160.2 lb

## 2017-03-30 DIAGNOSIS — Z1159 Encounter for screening for other viral diseases: Secondary | ICD-10-CM

## 2017-03-30 DIAGNOSIS — J019 Acute sinusitis, unspecified: Secondary | ICD-10-CM | POA: Diagnosis not present

## 2017-03-30 DIAGNOSIS — Z8669 Personal history of other diseases of the nervous system and sense organs: Secondary | ICD-10-CM

## 2017-03-30 DIAGNOSIS — J329 Chronic sinusitis, unspecified: Secondary | ICD-10-CM

## 2017-03-30 DIAGNOSIS — E2839 Other primary ovarian failure: Secondary | ICD-10-CM

## 2017-03-30 DIAGNOSIS — A159 Respiratory tuberculosis unspecified: Secondary | ICD-10-CM

## 2017-03-30 DIAGNOSIS — Z0000 Encounter for general adult medical examination without abnormal findings: Secondary | ICD-10-CM | POA: Diagnosis not present

## 2017-03-30 DIAGNOSIS — Z23 Encounter for immunization: Secondary | ICD-10-CM | POA: Diagnosis not present

## 2017-03-30 DIAGNOSIS — H547 Unspecified visual loss: Secondary | ICD-10-CM

## 2017-03-30 DIAGNOSIS — Z1211 Encounter for screening for malignant neoplasm of colon: Secondary | ICD-10-CM

## 2017-03-30 DIAGNOSIS — H9193 Unspecified hearing loss, bilateral: Secondary | ICD-10-CM | POA: Diagnosis not present

## 2017-03-30 DIAGNOSIS — I10 Essential (primary) hypertension: Secondary | ICD-10-CM

## 2017-03-30 DIAGNOSIS — F419 Anxiety disorder, unspecified: Secondary | ICD-10-CM | POA: Diagnosis not present

## 2017-03-30 DIAGNOSIS — F17200 Nicotine dependence, unspecified, uncomplicated: Secondary | ICD-10-CM

## 2017-03-30 DIAGNOSIS — Z1239 Encounter for other screening for malignant neoplasm of breast: Secondary | ICD-10-CM

## 2017-03-30 DIAGNOSIS — Z1231 Encounter for screening mammogram for malignant neoplasm of breast: Secondary | ICD-10-CM

## 2017-03-30 DIAGNOSIS — F32A Depression, unspecified: Secondary | ICD-10-CM

## 2017-03-30 DIAGNOSIS — L4050 Arthropathic psoriasis, unspecified: Secondary | ICD-10-CM

## 2017-03-30 DIAGNOSIS — F329 Major depressive disorder, single episode, unspecified: Secondary | ICD-10-CM

## 2017-03-30 LAB — POCT URINALYSIS DIP (PROADVANTAGE DEVICE)
Bilirubin, UA: NEGATIVE
Blood, UA: NEGATIVE
Glucose, UA: NEGATIVE mg/dL
Ketones, POC UA: NEGATIVE mg/dL
Leukocytes, UA: NEGATIVE
Nitrite, UA: NEGATIVE
PROTEIN UA: NEGATIVE mg/dL
SPECIFIC GRAVITY, URINE: 1.03
UUROB: NEGATIVE
pH, UA: 6 (ref 5.0–8.0)

## 2017-03-30 MED ORDER — AZITHROMYCIN 250 MG PO TABS
ORAL_TABLET | ORAL | 0 refills | Status: DC
Start: 2017-03-30 — End: 2017-08-09

## 2017-03-30 MED ORDER — LISINOPRIL-HYDROCHLOROTHIAZIDE 10-12.5 MG PO TABS: 1.0000 | ORAL_TABLET | Freq: Every day | ORAL | 2 refills | Status: DC

## 2017-03-30 NOTE — Patient Instructions (Addendum)
Call and schedule an eye exam.   Call and schedule your mammogram and bone density.   GI will call you to schedule your appointment.   Call and schedule a dermatology appointment.   Call and schedule a counseling appointment.   Start getting 150 minutes of physical activity per week.   No more than 1 alcoholic beverage daily.  Consider stopping smoking please.    Start back on your blood pressure medication and check your BP at home. If your BP is not <130/80 on a regular basis then let me know.     Follow up in 3 months or sooner if needed pending your blood work.    Dermatology offices  [facility]: Phone #: [phone] Address: [address] [place], [place] [address]  [facility]: Phone: [phone]  Address: [address] [place], [place] [address]  [facility] Address: [address] [place], [place], [place] [address] Phone: [phone] is a list of Eye Doctors that you call and schedule an appointment with.   Below is a list of eye doctors:   [facility] Address: [place], [place], [place] [address] Phone: [phone]   [facility] Address: [address], [place], [place] [address] Phone: [phone]  Dr. Katy Fitch Address: [address] [place], [place] [address] Phone: [phone]  [facility] [address] [place], [place]  [address] Telephone: [phone]  You can call to schedule your appointment with a counselor. A few offices are listed below for you to call.   [facility]  [place], [place], [place] [address]  Phone: [phone]  [place] [address] [place], [place] [address]  Phone: [phone]  [facility]  [address], [place], [place] [address] Phone: [phone]  Preventative Care for Adults - Female      [place]:  A routine yearly physical is a good way to check in with your primary care provider about your health and preventive screening. It is also an opportunity to share updates about your health and any concerns you have, and receive a thorough all-over exam.   Most health insurance companies pay for at least some preventative services.  Check with your health plan for specific coverages.  WHAT PREVENTATIVE SERVICES DO WOMEN NEED?  Adult women should have their weight and blood pressure checked regularly.   Women age 21 and older should have their cholesterol levels checked regularly.  Women should be screened for cervical cancer with a Pap smear and pelvic exam beginning at either age 41, or 3 years after they become sexually activity.    Breast cancer screening generally begins at age 59 with a mammogram and breast exam by your primary care provider.    Beginning at age 33 and continuing to age 7, women should be screened for colorectal cancer.  Certain people may need continued testing until age 8.  Updating vaccinations is part of preventative care.  Vaccinations help protect against diseases such as the flu.  Osteoporosis is a disease in which the bones lose minerals and strength as we age. Women ages 24 and over should discuss this with their caregivers, as should women after menopause who have other risk factors.  Lab tests are generally done as part of preventative care to screen for anemia and blood disorders, to screen for problems with the  kidneys and liver, to screen for bladder problems, to check blood sugar, and to check your cholesterol level.  Preventative services generally include counseling about diet, exercise, avoiding tobacco, drugs, excessive alcohol consumption, and sexually transmitted infections.    GENERAL RECOMMENDATIONS FOR GOOD HEALTH:  Healthy diet:  Eat a variety of foods, including fruit, vegetables, animal or vegetable protein, such as meat,  fish, chicken, and eggs, or beans, lentils, tofu, and grains, such as rice.  Drink plenty of water daily.  Decrease saturated fat in the diet, avoid lots of red meat, processed foods, sweets, fast foods, and fried foods.  Exercise:  Aerobic exercise helps maintain good heart health. At least 30-40 minutes of moderate-intensity exercise is recommended. For example, a brisk walk that increases your heart rate and breathing. This should be done on most days of the week.   Find a type of exercise or a variety of exercises that you enjoy so that it becomes a part of your daily life.  Examples are running, walking, swimming, water aerobics, and biking.  For motivation and support, explore group exercise such as aerobic class, spin class, Zumba, Yoga,or  martial arts, etc.    Set exercise goals for yourself, such as a certain weight goal, walk or run in a race such as a 5k walk/run.  Speak to your primary care provider about exercise goals.  Disease prevention:  If you smoke or chew tobacco, find out from your caregiver how to quit. It can literally save your life, no matter how long you have been a tobacco user. If you do not use tobacco, never begin.   Maintain a healthy diet and normal weight. Increased weight leads to problems with blood pressure and diabetes.   The Body Mass Index or BMI is a way of measuring how much of your body is fat. Having a BMI above 27 increases the risk of heart disease, diabetes, hypertension, stroke and other problems related to obesity. Your caregiver can help determine your BMI and based on it develop an exercise and dietary program to help you achieve or maintain this important measurement at a healthful level.  High blood pressure causes heart and blood vessel problems.  Persistent high blood pressure should be treated with medicine if weight loss and exercise do not work.   Fat and cholesterol leaves deposits in your arteries that can block them. This causes  heart disease and vessel disease elsewhere in your body.  If your cholesterol is found to be high, or if you have heart disease or certain other medical conditions, then you may need to have your cholesterol monitored frequently and be treated with medication.   Ask if you should have a cardiac stress test if your history suggests this. A stress test is a test done on a treadmill that looks for heart disease. This test can find disease prior to there being a problem.  Menopause can be associated with physical symptoms and risks. Hormone replacement therapy is available to decrease these. You should talk to your caregiver about whether starting or continuing to take hormones is right for you.   Osteoporosis is a disease in which the bones lose minerals and strength as we age. This can result in serious bone fractures. Risk of osteoporosis can be identified using a bone density scan. Women ages 20 and over should discuss this with their caregivers, as should women after menopause who have other risk factors. Ask your caregiver whether you should be taking a calcium supplement  and Vitamin D, to reduce the rate of osteoporosis.   Avoid drinking alcohol in excess (more than two drinks per day).  Avoid use of street drugs. Do not share needles with anyone. Ask for professional help if you need assistance or instructions on stopping the use of alcohol, cigarettes, and/or drugs.  Brush your teeth twice a day with fluoride toothpaste, and floss once a day. Good oral hygiene prevents tooth decay and gum disease. The problems can be painful, unattractive, and can cause other health problems. Visit your dentist for a routine oral and dental check up and preventive care every 6-12 months.   Look at your skin regularly.  Use a mirror to look at your back. Notify your caregivers of changes in moles, especially if there are changes in shapes, colors, a size larger than a pencil eraser, an irregular border, or  development of new moles.  Safety:  Use seatbelts 100% of the time, whether driving or as a passenger.  Use safety devices such as hearing protection if you work in environments with loud noise or significant background noise.  Use safety glasses when doing any work that could send debris in to the eyes.  Use a helmet if you ride a bike or motorcycle.  Use appropriate safety gear for contact sports.  Talk to your caregiver about gun safety.  Use sunscreen with a SPF (or skin protection factor) of 15 or greater.  Lighter skinned people are at a greater risk of skin cancer. Don't forget to also wear sunglasses in order to protect your eyes from too much damaging sunlight. Damaging sunlight can accelerate cataract formation.   Practice safe sex. Use condoms. Condoms are used for birth control and to help reduce the spread of sexually transmitted infections (or STIs).  Some of the STIs are gonorrhea (the clap), chlamydia, syphilis, trichomonas, herpes, HPV (human papilloma virus) and HIV (human immunodeficiency virus) which causes AIDS. The herpes, HIV and HPV are viral illnesses that have no cure. These can result in disability, cancer and death.   Keep carbon monoxide and smoke detectors in your home functioning at all times. Change the batteries every 6 months or use a model that plugs into the wall.   Vaccinations:  Stay up to date with your tetanus shots and other required immunizations. You should have a booster for tetanus every 10 years. Be sure to get your flu shot every year, since 5%-20% of the [place] population comes down with the flu. The flu vaccine changes each year, so being vaccinated once is not enough. Get your shot in the fall, before the flu season peaks.   Other vaccines to consider:  Human Papilloma Virus or HPV causes cancer of the cervix, and other infections that can be transmitted from person to person. There is a vaccine for HPV, and females should get immunized between the  ages of 58 and 46. It requires a series of 3 shots.   Pneumococcal vaccine to protect against certain types of pneumonia.  This is normally recommended for adults age 64 or older.  However, adults younger than 54 years old with certain underlying conditions such as diabetes, heart or lung disease should also receive the vaccine.  Shingles vaccine to protect against Varicella Zoster if you are older than age 12, or younger than 54 years old with certain underlying illness.  Hepatitis A vaccine to protect against a form of infection of the liver by a virus acquired from food.  Hepatitis B vaccine to  protect against a form of infection of the liver by a virus acquired from blood or body fluids, particularly if you work in health care.  If you plan to travel internationally, check with your local health department for specific vaccination recommendations.  Cancer Screening:  Breast cancer screening is essential to preventive care for women. All women age 84 and older should perform a breast self-exam every month. At age 57 and older, women should have their caregiver complete a breast exam each year. Women at ages 52 and older should have a mammogram (x-ray film) of the breasts. Your caregiver can discuss how often you need mammograms.    Cervical cancer screening includes taking a Pap smear (sample of cells examined under a microscope) from the cervix (end of the uterus). It also includes testing for HPV (Human Papilloma Virus, which can cause cervical cancer). Screening and a pelvic exam should begin at age 25, or 3 years after a woman becomes sexually active. Screening should occur every year, with a Pap smear but no HPV testing, up to age 93. After age 49, you should have a Pap smear every 3 years with HPV testing, if no HPV was found previously.   Most routine colon cancer screening begins at the age of 49. On a yearly basis, doctors may provide special easy to use take-home tests to check for  hidden blood in the stool. Sigmoidoscopy or colonoscopy can detect the earliest forms of colon cancer and is life saving. These tests use a small camera at the end of a tube to directly examine the colon. Speak to your caregiver about this at age 49, when routine screening begins (and is repeated every 5 years unless early forms of pre-cancerous polyps or small growths are found).

## 2017-03-31 LAB — CBC WITH DIFFERENTIAL/PLATELET
BASOS ABS: 0.1 10*3/uL (ref 0.0–0.2)
Basos: 1 %
EOS (ABSOLUTE): 0.2 10*3/uL (ref 0.0–0.4)
Eos: 2 %
Hematocrit: 40.3 % (ref 34.0–46.6)
Hemoglobin: 13.5 g/dL (ref 11.1–15.9)
Immature Grans (Abs): 0.1 10*3/uL (ref 0.0–0.1)
Immature Granulocytes: 1 %
LYMPHS ABS: 2.5 10*3/uL (ref 0.7–3.1)
Lymphs: 29 %
MCH: 31.5 pg (ref 26.6–33.0)
MCHC: 33.5 g/dL (ref 31.5–35.7)
MCV: 94 fL (ref 79–97)
MONOS ABS: 1 10*3/uL — AB (ref 0.1–0.9)
Monocytes: 11 %
NEUTROS PCT: 56 %
Neutrophils Absolute: 5.1 10*3/uL (ref 1.4–7.0)
PLATELETS: 351 10*3/uL (ref 150–379)
RBC: 4.28 x10E6/uL (ref 3.77–5.28)
RDW: 13.4 % (ref 12.3–15.4)
WBC: 8.8 10*3/uL (ref 3.4–10.8)

## 2017-03-31 LAB — TSH: TSH: 1.33 u[IU]/mL (ref 0.450–4.500)

## 2017-03-31 LAB — COMPREHENSIVE METABOLIC PANEL
ALK PHOS: 65 IU/L (ref 39–117)
ALT: 20 IU/L (ref 0–32)
AST: 16 IU/L (ref 0–40)
Albumin/Globulin Ratio: 1.7 (ref 1.2–2.2)
Albumin: 4.2 g/dL (ref 3.5–5.5)
BUN/Creatinine Ratio: 21 (ref 9–23)
BUN: 14 mg/dL (ref 6–24)
Bilirubin Total: 0.3 mg/dL (ref 0.0–1.2)
CO2: 22 mmol/L (ref 20–29)
Calcium: 9.4 mg/dL (ref 8.7–10.2)
Chloride: 105 mmol/L (ref 96–106)
Creatinine, Ser: 0.66 mg/dL (ref 0.57–1.00)
GFR calc Af Amer: 117 mL/min/{1.73_m2} (ref >59)
GFR calc non Af Amer: 101 mL/min/{1.73_m2} (ref >59)
GLOBULIN, TOTAL: 2.5 g/dL (ref 1.5–4.5)
GLUCOSE: 97 mg/dL (ref 65–99)
Potassium: 4.8 mmol/L (ref 3.5–5.2)
SODIUM: 143 mmol/L (ref 134–144)
Total Protein: 6.7 g/dL (ref 6.0–8.5)

## 2017-03-31 LAB — LIPID PANEL
Chol/HDL Ratio: 3.3 ratio (ref 0.0–4.4)
Cholesterol, Total: 191 mg/dL (ref 100–199)
HDL: 58 mg/dL (ref >39)
LDL Calculated: 121 mg/dL — ABNORMAL HIGH (ref 0–99)
Triglycerides: 60 mg/dL (ref 0–149)
VLDL Cholesterol Cal: 12 mg/dL (ref 5–40)

## 2017-03-31 LAB — HEPATITIS C ANTIBODY

## 2017-04-02 ENCOUNTER — Ambulatory Visit: Payer: 59

## 2017-04-03 DIAGNOSIS — H905 Unspecified sensorineural hearing loss: Secondary | ICD-10-CM | POA: Diagnosis not present

## 2017-04-03 DIAGNOSIS — H903 Sensorineural hearing loss, bilateral: Secondary | ICD-10-CM | POA: Diagnosis not present

## 2017-04-03 DIAGNOSIS — J3489 Other specified disorders of nose and nasal sinuses: Secondary | ICD-10-CM | POA: Diagnosis not present

## 2017-04-05 ENCOUNTER — Ambulatory Visit: Payer: 59

## 2017-04-05 DIAGNOSIS — M25551 Pain in right hip: Secondary | ICD-10-CM

## 2017-04-05 DIAGNOSIS — M25651 Stiffness of right hip, not elsewhere classified: Secondary | ICD-10-CM

## 2017-04-05 DIAGNOSIS — R293 Abnormal posture: Secondary | ICD-10-CM | POA: Diagnosis present

## 2017-04-05 DIAGNOSIS — R262 Difficulty in walking, not elsewhere classified: Secondary | ICD-10-CM

## 2017-04-05 NOTE — Therapy (Signed)
[place], [place], [address] Phone: [phone]   Fax:  [phone]  Physical Therapy Treatment  Patient Details  Name: Valerie Delgado MRN: [id number] Date of Birth: November 25, 1965 Referring Provider: Rodell Perna  MD   Encounter Date: 04/05/2017  PT End of Session - 04/05/17 0706    Visit Number  4    Number of Visits  12    Date for PT Re-Evaluation  04/06/17    PT Start Time  0705    PT Stop Time  0745    PT Time Calculation (min)  40 min    Activity Tolerance  Patient tolerated treatment well;No increased pain    Behavior During Therapy  WFL for tasks assessed/performed       Past Medical History:  Diagnosis Date  . Anxiety   . Depression   . Meniere's disease of left ear   . Psoriatic arthritis ([place])    Dr. Estanislado Pandy  . Tuberculosis    started treatment on 08/30/2015    Past Surgical History:  Procedure Laterality Date  . CESAREAN SECTION      There were no vitals filed for this visit.  Subjective Assessment - 04/05/17 0705    Subjective  Feeling good today. No pain . doing better.     Currently in Pain?  No/denies         [facility] PT Assessment - 04/05/17 0001      PROM   Right Hip External Rotation   45 act 40 degrees    Right Hip Internal Rotation   25 active 22      Right Hip   Right Hip Extension  8 active 5 degrees    Right Hip Flexion  115 active 110 degrees    Right Hip ABduction  23 active 20 degrees                  [facility] Adult PT Treatment/Exercise - 04/05/17 0001      Knee/Hip Exercises: Stretches   Other Knee/Hip Stretches  standing hip extension and abduction stretch      Knee/Hip Exercises: Aerobic   Stationary Bike  L3 5 min      Knee/Hip Exercises: Supine   Bridges  15 reps    Bridges with Clamshell  Both;15 reps    Single Leg Bridge  Right;Left;10 reps    Other Supine Knee/Hip Exercises  clam green band x 20       Knee/Hip Exercises: Sidelying    Hip ABduction  Right;15 reps;Limitations    Hip ABduction Limitations  green band at ankles      Knee/Hip Exercises: Prone   Straight Leg Raises  Right;15 reps      Manual Therapy   Joint Mobilization  AP glides 2-3 gr 50 reps     Manual Traction  2 sets 100 reps long axis pull RT hip , discussed how to instruct someone at home to perform.                PT Short Term Goals - 04/05/17 0706      PT SHORT TERM GOAL #1   Title  She wil be independent with initial HEP    Status  Achieved      PT SHORT TERM GOAL #2   Title  She will report pain with walking decreased 25% or more    Status  Achieved      PT SHORT TERM GOAL #3   Title  She will improve RT hip flexion to 100 degrees and extension to 10 degrees to decr pain    Baseline  110 flex , 8 extension    Status  Partially Met        PT Long Term Goals - 04/05/17 0745      PT LONG TERM GOAL #1   Title  She will be independent with all hEP issued .     Status  On-going      PT LONG TERM GOAL #2   Title  She will walk intermittantly without pain    Status  Achieved      PT LONG TERM GOAL #3   Title  She will report no pain with sitting for 45 min or more without fidgiting    Status  On-going      PT LONG TERM GOAL #4   Title  She will report able to shop at store without limting time due to pain    Status  On-going      PT LONG TERM GOAL #5   Title  She will report pain getting out of chair decrased 50% or more    Status  On-going            Plan - 04/05/17 0706    Clinical Impression Statement  Decreased limp with walking almost none today. active ROM RT hip all improved , pain less but variable. Encouraged her to find someone to pull leg at home and need to do all HEP to maintain improvements    PT Frequency  2x / week    PT Duration  2 weeks    PT Treatment/Interventions  Cryotherapy;Iontophoresis 73m/ml Dexamethasone;Moist Heat;Ultrasound;Therapeutic exercise;Manual  techniques;Taping;Patient/family education;Gait training    PT Next Visit Plan  Manual , progress HEP with ROM /stretcing/ strength , modalities.     PT Home Exercise Plan  bridge, hip adduction squeezed and blue band clams  hip flexion and abduction stretching    Consulted and Agree with Plan of Care  Patient       Patient will benefit from skilled therapeutic intervention in order to improve the following deficits and impairments:  Pain, Postural dysfunction, Decreased activity tolerance, Decreased range of motion, Difficulty walking  Visit Diagnosis: Pain in right hip  Stiffness of right hip, not elsewhere classified  Difficulty in walking, not elsewhere classified  Abnormal posture     Problem List Patient Active Problem List   Diagnosis Date Noted  . Decreased visual acuity 03/30/2017  . Estrogen deficiency 03/30/2017  . Inflammatory arthritis 11/13/2016  . Essential hypertension 09/19/2016  . History of anxiety and depression 09/19/2016  . History of Meniere's disease 09/19/2016  . History of hearing loss 09/19/2016  . History of ADHD 09/19/2016  . Dyspareunia, female 01/11/2016  . Routine general medical examination at a health care facility 01/11/2016  . Smoker 09/01/2015  . Anxiety and depression 09/01/2015  . Positive QuantiFERON-TB Gold test 09/01/2015  . History of herpes simplex infection 09/01/2015    CDarrel Hoover PT 04/05/2017, 7:45 AM  [facility] [address]Phone: [phone]  Fax:  [phone] Name: HKeiandra SullengerMRN: [id number] of Birth: 5August 31, 1967

## 2017-04-09 ENCOUNTER — Ambulatory Visit: Payer: 59

## 2017-04-12 ENCOUNTER — Ambulatory Visit: Payer: 59

## 2017-04-12 DIAGNOSIS — R293 Abnormal posture: Secondary | ICD-10-CM

## 2017-04-12 DIAGNOSIS — M25551 Pain in right hip: Secondary | ICD-10-CM | POA: Diagnosis not present

## 2017-04-12 DIAGNOSIS — M25651 Stiffness of right hip, not elsewhere classified: Secondary | ICD-10-CM

## 2017-04-12 DIAGNOSIS — R262 Difficulty in walking, not elsewhere classified: Secondary | ICD-10-CM

## 2017-04-12 NOTE — Therapy (Signed)
[place], [place], [address] Phone: [phone]   Fax:  [phone]  Physical Therapy Treatment/Discharge  Patient Details  Name: Valerie Delgado MRN: [id number] Date of Birth: 1965/09/04 Referring Provider: Rodell Perna  MD   Encounter Date: 04/12/2017  PT End of Session - 04/12/17 0700    Visit Number  5    Number of Visits  12    Date for PT Re-Evaluation  04/06/17    PT Start Time  0700    PT Stop Time  0740    PT Time Calculation (min)  40 min    Activity Tolerance  Patient tolerated treatment well;No increased pain    Behavior During Therapy  WFL for tasks assessed/performed       Past Medical History:  Diagnosis Date  . Anxiety   . Depression   . Meniere's disease of left ear   . Psoriatic arthritis ([place])    Dr. Estanislado Pandy  . Tuberculosis    started treatment on 08/30/2015    Past Surgical History:  Procedure Laterality Date  . CESAREAN SECTION      There were no vitals filed for this visit.  Subjective Assessment - 04/12/17 0702    Subjective  Moving good , no pain, reports doing HEP, ready for discharge    Currently in Pain?  No/denies                      [facility] Adult PT Treatment/Exercise - 04/12/17 0001      Knee/Hip Exercises: Stretches   Passive Hamstring Stretch  Right;30 seconds    Other Knee/Hip Stretches  Knee to chest x 45 sec RT  then fig 4  push distal x 60 sec then across chest x 60 sec then  stand hip flexor stret ch RT knee on mat and adductor stretch x 60 sec each      Knee/Hip Exercises: Aerobic   Stationary Bike  L3 5 min      Knee/Hip Exercises: Standing   Other Standing Knee Exercises  Green band SLR RT and LT x 12 reps issued for HEP             PT Education - 04/12/17 0718    Education provided  Yes    Education Details  stand SLR green band    Person(s) Educated  Patient    Methods  Explanation;Demonstration;Verbal cues;Handout    Comprehension  Returned demonstration;Verbalized understanding       PT Short Term Goals - 04/12/17 0740      PT SHORT TERM GOAL #1   Title  She wil be independent with initial HEP    Status  Achieved      PT SHORT TERM GOAL #2   Title  She will report pain with walking decreased 25% or more    Status  Achieved      PT SHORT TERM GOAL #3   Title  She will improve RT hip flexion to 100 degrees and extension to 10 degrees to decr pain    Baseline  110 flex , 8 extension    Status  Partially Met        PT Long Term Goals - 04/12/17 0704      PT LONG TERM GOAL #1   Title  She will be independent with all hEP issued .     Status  Achieved      PT LONG TERM GOAL #2   Title  She will walk intermittantly without pain    Status  Achieved      PT LONG TERM GOAL #3   Title  She will report no pain with sitting for 45 min or more without fidgiting    Baseline  90-120 min    Status  Achieved      PT LONG TERM GOAL #4   Title  She will report able to shop at store without limting time due to pain    Status  Achieved      PT LONG TERM GOAL #5   Title  She will report pain getting out of chair decrased 50% or more    Status  Achieved            Plan - 04/12/17 0657    Clinical Impression Statement  Decr hip ext causes some gait deviation but otherwise no antalgia with walking. goal s met . She agreed to discharge    PT Treatment/Interventions  Cryotherapy;Iontophoresis 66m/ml Dexamethasone;Moist Heat;Ultrasound;Therapeutic exercise;Manual techniques;Taping;Patient/family education;Gait training    PT Next Visit Plan  Manual , progress HEP with ROM /stretcing/ strength , modalities.     PT Home Exercise Plan  bridge, hip adduction squeezed and blue band clams  hip flexion and abduction stretching    Consulted and Agree with Plan of Care  Patient       Patient will benefit from skilled therapeutic intervention in order to improve the following deficits and impairments:   Pain, Postural dysfunction, Decreased activity tolerance, Decreased range of motion, Difficulty walking  Visit Diagnosis: Pain in right hip  Stiffness of right hip, not elsewhere classified  Difficulty in walking, not elsewhere classified  Abnormal posture     Problem List Patient Active Problem List   Diagnosis Date Noted  . Decreased visual acuity 03/30/2017  . Estrogen deficiency 03/30/2017  . Inflammatory arthritis 11/13/2016  . Essential hypertension 09/19/2016  . History of anxiety and depression 09/19/2016  . History of Meniere's disease 09/19/2016  . History of hearing loss 09/19/2016  . History of ADHD 09/19/2016  . Dyspareunia, female 01/11/2016  . Routine general medical examination at a health care facility 01/11/2016  . Smoker 09/01/2015  . Anxiety and depression 09/01/2015  . Positive QuantiFERON-TB Gold test 09/01/2015  . History of herpes simplex infection 09/01/2015    CDarrel Hoover PT 04/12/2017, 7:41 AM  [facility] [address]Phone: [phone]  Fax:  [phone] Name: Valerie TerreroMRN: [id number] of Birth: 5Dec 21, 1967 PHYSICAL THERAPY DISCHARGE SUMMARY  Visits from Start of Care: 5  Current functional level related to goals / functional outcomes: See above   Remaining deficits: See above . Continued with decr ROM RT hip   Education / Equipment: HEP Plan: Patient agrees to discharge.  Patient goals were met. Patient is being discharged due to meeting the stated rehab goals.  ?????    She is pleased with orgress and work makes getting here harder

## 2017-04-12 NOTE — Patient Instructions (Signed)
Issued from cabinet standing SLR x 10-20 reps 1-2x/day RT/LT  Green band issued

## 2017-04-17 DIAGNOSIS — H905 Unspecified sensorineural hearing loss: Secondary | ICD-10-CM | POA: Diagnosis not present

## 2017-04-17 DIAGNOSIS — J3489 Other specified disorders of nose and nasal sinuses: Secondary | ICD-10-CM | POA: Diagnosis not present

## 2017-05-16 ENCOUNTER — Encounter: Payer: Self-pay | Admitting: Family Medicine

## 2017-06-27 ENCOUNTER — Ambulatory Visit: Payer: 59 | Admitting: Family Medicine

## 2017-07-30 ENCOUNTER — Other Ambulatory Visit: Payer: Self-pay | Admitting: Family Medicine

## 2017-07-30 DIAGNOSIS — Z8619 Personal history of other infectious and parasitic diseases: Secondary | ICD-10-CM

## 2017-07-30 NOTE — Telephone Encounter (Signed)
Is this okay to refill? 

## 2017-08-06 ENCOUNTER — Encounter: Payer: Self-pay | Admitting: Internal Medicine

## 2017-08-09 ENCOUNTER — Ambulatory Visit: Payer: 59 | Admitting: Family Medicine

## 2017-08-09 ENCOUNTER — Encounter: Payer: Self-pay | Admitting: Family Medicine

## 2017-08-09 VITALS — BP 120/80 | HR 99 | Ht 66.0 in | Wt 160.2 lb

## 2017-08-09 DIAGNOSIS — F17200 Nicotine dependence, unspecified, uncomplicated: Secondary | ICD-10-CM | POA: Diagnosis not present

## 2017-08-09 DIAGNOSIS — I10 Essential (primary) hypertension: Secondary | ICD-10-CM | POA: Diagnosis not present

## 2017-08-09 NOTE — Progress Notes (Signed)
   Subjective:    Patient ID: Valerie Delgado, female    DOB: 03-12-1965, 52 y.o.   MRN: [id number]  HPI Chief Complaint  Patient presents with  . 3 month follow-up    3 month follow-up   She is here to follow up on HTN and other chronic health conditions.  States she takes her BP medication daily. No side effects.  Does not check her BP at home.   Smoking - no current plans to stop but know she needs to stop. Has tried Wellbutrin in the past. States her psychiatrist does not think Chantix is a good idea.   She did not get her mammogram and bone density. She does plan to do this.   August 8th colonoscopy   Seeing her psychiatrist, rheumatologist and orthopedist.   Denies fever, chills, dizziness, chest pain, palpitations, shortness of breath, abdominal pain, N/V/D, urinary symptoms, LE edema.   Reviewed allergies, medications, past medical, surgical, family, and social history.   Review of Systems Pertinent positives and negatives in the history of present illness.     Objective:   Physical Exam BP 120/80   Pulse 99   Ht 5\' 6"  (1.676 m)   Wt 160 lb 3.2 oz (72.7 kg)   BMI 25.86 kg/m   Alert and oriented and in no acute distress. Not otherwise examined.       Assessment & Plan:  Essential hypertension  Smoker  BP is at goal range. Continue on lisinopril-HCTZ.  Discussed lifestyle modifications to help control HTN.  Smoking cessation counseling done. Recommend checking [place] classes. Writing down why and when she smokes and then what healthy habits she could replace smoking with. 1-800-Quit now.  Continue seeing her specialists for chronic health conditions.  She will call and schedule her mammogram and DEXA. Information provided again.  Upcoming colonoscopy appointment.  Follow up in 6 months or sooner if needed.

## 2017-08-09 NOTE — Patient Instructions (Addendum)
Your BP is great today at 120/80. Continue on your medication.    Steps to Quit Smoking Smoking tobacco can be harmful to your health and can affect almost every organ in your body. Smoking puts you, and those around you, at risk for developing many serious chronic diseases. Quitting smoking is difficult, but it is one of the best things that you can do for your health. It is never too late to quit. What are the benefits of quitting smoking? When you quit smoking, you lower your risk of developing serious diseases and conditions, such as:  Lung cancer or lung disease, such as COPD.  Heart disease.  Stroke.  Heart attack.  Infertility.  Osteoporosis and bone fractures.  Additionally, symptoms such as coughing, wheezing, and shortness of breath may get better when you quit. You may also find that you get sick less often because your body is stronger at fighting off colds and infections. If you are pregnant, quitting smoking can help to reduce your chances of having a baby of low birth weight. How do I get ready to quit? When you decide to quit smoking, create a plan to make sure that you are successful. Before you quit:  Pick a date to quit. Set a date within the next two weeks to give you time to prepare.  Write down the reasons why you are quitting. Keep this list in places where you will see it often, such as on your bathroom mirror or in your car or wallet.  Identify the people, places, things, and activities that make you want to smoke (triggers) and avoid them. Make sure to take these actions: ? Throw away all cigarettes at home, at work, and in your car. ? Throw away smoking accessories, such as Scientist, research (medical). ? Clean your car and make sure to empty the ashtray. ? Clean your home, including curtains and carpets.  Tell your family, friends, and coworkers that you are quitting. Support from your loved ones can make quitting easier.  Talk with your health care provider  about your options for quitting smoking.  Find out what treatment options are covered by your health insurance.  What strategies can I use to quit smoking? Talk with your healthcare provider about different strategies to quit smoking. Some strategies include:  Quitting smoking altogether instead of gradually lessening how much you smoke over a period of time. Research shows that quitting "cold [place]" is more successful than gradually quitting.  Attending in-person counseling to help you build problem-solving skills. You are more likely to have success in quitting if you attend several counseling sessions. Even short sessions of 10 minutes can be effective.  Finding resources and support systems that can help you to quit smoking and remain smoke-free after you quit. These resources are most helpful when you use them often. They can include: ? Online chats with a Social worker. ? Telephone quitlines. ? Careers information officer. ? Support groups or group counseling. ? Text messaging programs. ? Mobile phone applications.  Taking medicines to help you quit smoking. (If you are pregnant or breastfeeding, talk with your health care provider first.) Some medicines contain nicotine and some do not. Both types of medicines help with cravings, but the medicines that include nicotine help to relieve withdrawal symptoms. Your health care provider may recommend: ? Nicotine patches, gum, or lozenges. ? Nicotine inhalers or sprays. ? Non-nicotine medicine that is taken by mouth.  Talk with your health care provider about combining strategies, such as  taking medicines while you are also receiving in-person counseling. Using these two strategies together makes you more likely to succeed in quitting than if you used either strategy on its own. If you are pregnant or breastfeeding, talk with your health care provider about finding counseling or other support strategies to quit smoking. Do not take medicine to  help you quit smoking unless told to do so by your health care provider. What things can I do to make it easier to quit? Quitting smoking might feel overwhelming at first, but there is a lot that you can do to make it easier. Take these important actions:  Reach out to your family and friends and ask that they support and encourage you during this time. Call telephone quitlines, reach out to support groups, or work with a counselor for support.  Ask people who smoke to avoid smoking around you.  Avoid places that trigger you to smoke, such as bars, parties, or smoke-break areas at work.  Spend time around people who do not smoke.  Lessen stress in your life, because stress can be a smoking trigger for some people. To lessen stress, try: ? Exercising regularly. ? Deep-breathing exercises. ? Yoga. ? Meditating. ? Performing a body scan. This involves closing your eyes, scanning your body from head to toe, and noticing which parts of your body are particularly tense. Purposefully relax the muscles in those areas.  Download or purchase mobile phone or tablet apps (applications) that can help you stick to your quit plan by providing reminders, tips, and encouragement. There are many free apps, such as QuitGuide from the [employer] Office manager for Disease Control and Prevention). You can find other support for quitting smoking (smoking cessation) through smokefree.gov and other websites.  How will I feel when I quit smoking? Within the first 24 hours of quitting smoking, you may start to feel some withdrawal symptoms. These symptoms are usually most noticeable 2-3 days after quitting, but they usually do not last beyond 2-3 weeks. Changes or symptoms that you might experience include:  Mood swings.  Restlessness, anxiety, or irritation.  Difficulty concentrating.  Dizziness.  Strong cravings for sugary foods in addition to nicotine.  Mild weight gain.  Constipation.  Nausea.  Coughing or a  sore throat.  Changes in how your medicines work in your body.  A depressed mood.  Difficulty sleeping (insomnia).  After the first 2-3 weeks of quitting, you may start to notice more positive results, such as:  Improved sense of smell and taste.  Decreased coughing and sore throat.  Slower heart rate.  Lower blood pressure.  Clearer skin.  The ability to breathe more easily.  Fewer sick days.  Quitting smoking is very challenging for most people. Do not get discouraged if you are not successful the first time. Some people need to make many attempts to quit before they achieve long-term success. Do your best to stick to your quit plan, and talk with your health care provider if you have any questions or concerns. This information is not intended to replace advice given to you by your health care provider. Make sure you discuss any questions you have with your health care provider. Document Released: 01/31/2001 Document Revised: 10/05/2015 Document Reviewed: 06/23/2014 Elsevier Interactive Patient Education  [employer].

## 2017-08-18 ENCOUNTER — Other Ambulatory Visit: Payer: Self-pay | Admitting: Family Medicine

## 2017-08-18 DIAGNOSIS — I10 Essential (primary) hypertension: Secondary | ICD-10-CM

## 2017-09-11 ENCOUNTER — Ambulatory Visit (AMBULATORY_SURGERY_CENTER): Payer: Self-pay

## 2017-09-11 VITALS — Ht 66.0 in | Wt 160.0 lb

## 2017-09-11 DIAGNOSIS — Z1211 Encounter for screening for malignant neoplasm of colon: Secondary | ICD-10-CM

## 2017-09-11 NOTE — Progress Notes (Signed)
Denies allergies to eggs or soy products. Denies complication of anesthesia or sedation. Denies use of weight loss medication. Denies use of O2.   Emmi instructions declined.  

## 2017-09-13 ENCOUNTER — Encounter: Payer: Self-pay | Admitting: Internal Medicine

## 2017-09-27 ENCOUNTER — Ambulatory Visit (AMBULATORY_SURGERY_CENTER): Payer: 59 | Admitting: Internal Medicine

## 2017-09-27 ENCOUNTER — Encounter: Payer: Self-pay | Admitting: Internal Medicine

## 2017-09-27 VITALS — BP 106/76 | HR 88 | Temp 96.8°F | Resp 14 | Ht 66.0 in | Wt 160.0 lb

## 2017-09-27 DIAGNOSIS — D124 Benign neoplasm of descending colon: Secondary | ICD-10-CM

## 2017-09-27 DIAGNOSIS — Z1211 Encounter for screening for malignant neoplasm of colon: Secondary | ICD-10-CM | POA: Diagnosis not present

## 2017-09-27 DIAGNOSIS — K6389 Other specified diseases of intestine: Secondary | ICD-10-CM | POA: Diagnosis not present

## 2017-09-27 MED ORDER — SODIUM CHLORIDE 0.9 % IV SOLN: 500.0000 mL | Freq: Once | INTRAVENOUS | Status: DC

## 2017-09-27 NOTE — Patient Instructions (Addendum)
I found and removed one tiny polyp. I will let you know pathology results and when to have another routine colonoscopy by mail and/or My Chart.  You also have a condition called diverticulosis - common and not usually a problem. Please read the handout provided.  Handouts given:  Diverticulosis and Polyps  I appreciate the opportunity to care for you. Gatha Mayer, MD, FACG  YOU HAD AN ENDOSCOPIC PROCEDURE TODAY AT [facility]:   Refer to the procedure report that was given to you for any specific questions about what was found during the examination.  If the procedure report does not answer your questions, please call your gastroenterologist to clarify.  If you requested that your care partner not be given the details of your procedure findings, then the procedure report has been included in a sealed envelope for you to review at your convenience later.  YOU SHOULD EXPECT: Some feelings of bloating in the abdomen. Passage of more gas than usual.  Walking can help get rid of the air that was put into your GI tract during the procedure and reduce the bloating. If you had a lower endoscopy (such as a colonoscopy or flexible sigmoidoscopy) you may notice spotting of blood in your stool or on the toilet paper. If you underwent a bowel prep for your procedure, you may not have a normal bowel movement for a few days.  Please Note:  You might notice some irritation and congestion in your nose or some drainage.  This is from the oxygen used during your procedure.  There is no need for concern and it should clear up in a day or so.  SYMPTOMS TO REPORT IMMEDIATELY:   Following lower endoscopy (colonoscopy or flexible sigmoidoscopy):  Excessive amounts of blood in the stool  Significant tenderness or worsening of abdominal pains  Swelling of the abdomen that is new, acute  Fever of 100F or higher  For urgent or emergent issues, a gastroenterologist can be reached at any  hour by calling [phone].   DIET:  We do recommend a small meal at first, but then you may proceed to your regular diet.  Drink plenty of fluids but you should avoid alcoholic beverages for 24 hours.  ACTIVITY:  You should plan to take it easy for the rest of today and you should NOT DRIVE or use heavy machinery until tomorrow (because of the sedation medicines used during the test).    FOLLOW UP: Our staff will call the number listed on your records the next business day following your procedure to check on you and address any questions or concerns that you may have regarding the information given to you following your procedure. If we do not reach you, we will leave a message.  However, if you are feeling well and you are not experiencing any problems, there is no need to return our call.  We will assume that you have returned to your regular daily activities without incident.  If any biopsies were taken you will be contacted by phone or by letter within the next 1-3 weeks.  Please call us at [phone] if you have not heard about the biopsies in 3 weeks.    SIGNATURES/CONFIDENTIALITY: You and/or your care partner have signed paperwork which will be entered into your electronic medical record.  These signatures attest to the fact that that the information above on your After Visit Summary has been reviewed and is understood.  Full responsibility of  the confidentiality of this discharge information lies with you and/or your care-partner. 

## 2017-09-27 NOTE — Progress Notes (Signed)
A/ox3, pleased with MAC, report to RN 

## 2017-09-27 NOTE — Progress Notes (Signed)
Pt's states no medical or surgical changes since previsit or office visit. 

## 2017-09-27 NOTE — Progress Notes (Signed)
Called to room to assist during endoscopic procedure.  Patient ID and intended procedure confirmed with present staff. Received instructions for my participation in the procedure from the performing physician.  

## 2017-09-27 NOTE — Progress Notes (Signed)
Patient consents to observer being present for procedure.   

## 2017-09-27 NOTE — Op Note (Signed)
[facility] Patient Name: Valerie Delgado Procedure Date: 09/27/2017 7:53 AM MRN: [id number] Endoscopist: Gatha Mayer , MD Age: 52 Referring MD:  Date of Birth: 1965-09-25 Gender: Female Account #: [id number] Procedure:                Colonoscopy Indications:              Screening for colorectal malignant neoplasm, This                            is the patient's first colonoscopy Medicines:                Propofol per Anesthesia, Monitored Anesthesia Care Procedure:                Pre-Anesthesia Assessment:                           - Prior to the procedure, a History and Physical                            was performed, and patient medications and                            allergies were reviewed. The patient's tolerance of                            previous anesthesia was also reviewed. The risks                            and benefits of the procedure and the sedation                            options and risks were discussed with the patient.                            All questions were answered, and informed consent                            was obtained. Prior Anticoagulants: The patient has                            taken no previous anticoagulant or antiplatelet                            agents. ASA Grade Assessment: II - A patient with                            mild systemic disease. After reviewing the risks                            and benefits, the patient was deemed in                            satisfactory condition to undergo the procedure.  After obtaining informed consent, the colonoscope                            was passed under direct vision. Throughout the                            procedure, the patient's blood pressure, pulse, and                            oxygen saturations were monitored continuously. The                            Colonoscope was introduced through the anus and   advanced to the the terminal ileum, with                            identification of the appendiceal orifice and IC                            valve. The colonoscopy was performed without                            difficulty. The patient tolerated the procedure                            well. The quality of the bowel preparation was                            excellent. The terminal ileum, ileocecal valve,                            appendiceal orifice, and rectum were photographed.                            The bowel preparation used was Miralax. Scope In: 8:13:52 AM Scope Out: 8:30:00 AM Scope Withdrawal Time: 0 hours 12 minutes 27 seconds  Total Procedure Duration: 0 hours 16 minutes 8 seconds  Findings:                 The perianal and digital rectal examinations were                            normal.                           A diminutive polyp was found in the descending                            colon. The polyp was sessile. The polyp was removed                            with a cold snare. Resection and retrieval were                            complete. Verification of patient  identification                            for the specimen was done. Estimated blood loss was                            minimal.                           Diverticula were found in the sigmoid colon.                           The exam was otherwise without abnormality on                            direct and retroflexion views. Complications:            No immediate complications. Estimated Blood Loss:     Estimated blood loss was minimal. Impression:               - One diminutive polyp in the descending colon,                            removed with a cold snare. Resected and retrieved.                           - Diverticulosis in the sigmoid colon.                           - The examination was otherwise normal on direct                            and retroflexion views. Recommendation:            - Patient has a contact number available for                            emergencies. The signs and symptoms of potential                            delayed complications were discussed with the                            patient. Return to normal activities tomorrow.                            Written discharge instructions were provided to the                            patient.                           - Resume previous diet.                           - Continue present medications.                           -  Repeat colonoscopy is recommended. The                            colonoscopy date will be determined after pathology                            results from today's exam become available for                            review. Gatha Mayer, MD 09/27/2017 8:36:46 AM This report has been signed electronically.

## 2017-09-28 ENCOUNTER — Telehealth: Payer: Self-pay | Admitting: *Deleted

## 2017-09-28 NOTE — Telephone Encounter (Signed)
Unable to leave message mailbox full.

## 2017-09-28 NOTE — Telephone Encounter (Signed)
Mailbox full unable to leave message.

## 2017-10-02 ENCOUNTER — Encounter: Payer: Self-pay | Admitting: Internal Medicine

## 2017-10-02 NOTE — Progress Notes (Signed)
Not precancerous polyp Recall 2029 My Chart

## 2017-10-19 ENCOUNTER — Telehealth: Payer: Self-pay | Admitting: Pharmacy Technician

## 2017-10-19 NOTE — Telephone Encounter (Signed)
Received a Prior Authorization renewal  request from [facility] for Enbrel Mini. Authorization has been submitted to patient's insurance via Cover My Meds. Will update once we receive a response.  8:42 AM Beatriz Chancellor, CPhT

## 2017-10-19 NOTE — Telephone Encounter (Signed)
Received a fax from [facility] regarding a prior authorization for Enbrel Mini. Authorization has been APPROVED from 10/19/17 to 10/20/2018.   Will send document to scan center.  Authorization # [id number] Phone # [phone]   11:16 AM Beatriz Chancellor, CPhT

## 2017-10-21 ENCOUNTER — Other Ambulatory Visit: Payer: Self-pay | Admitting: Rheumatology

## 2017-11-12 ENCOUNTER — Ambulatory Visit: Payer: 59 | Admitting: Family Medicine

## 2017-11-12 ENCOUNTER — Encounter: Payer: Self-pay | Admitting: Family Medicine

## 2017-11-12 VITALS — BP 110/68 | HR 105 | Temp 98.6°F | Resp 16 | Wt 159.6 lb

## 2017-11-12 DIAGNOSIS — R059 Cough, unspecified: Secondary | ICD-10-CM

## 2017-11-12 DIAGNOSIS — J0140 Acute pansinusitis, unspecified: Secondary | ICD-10-CM

## 2017-11-12 DIAGNOSIS — R05 Cough: Secondary | ICD-10-CM

## 2017-11-12 MED ORDER — AZITHROMYCIN 250 MG PO TABS: ORAL_TABLET | ORAL | 0 refills | Status: DC

## 2017-11-12 MED ORDER — PROMETHAZINE-DM 6.25-15 MG/5ML PO SYRP: 5.0000 mL | ORAL_SOLUTION | Freq: Every evening | ORAL | 0 refills | Status: DC | PRN

## 2017-11-12 NOTE — Progress Notes (Signed)
Chief Complaint  Patient presents with  . cold    sick since last monday- sinus pressure, sore throat, cough,drainage,now in chest    Subjective:  Valerie Delgado is a 52 y.o. female who presents for a one week history of nasal congestion, frontal headache, sinus pressure, left ear fullness, sore throat, post nasal drainage, and cough.   She went on a recent trip to [place].   Denies fever, chills, chest pain, palpitations, shortness of breath, wheezing, abdominal pain, N/V/D.   Treatment to date: Dayquil, Nyquil, airborne, vitamins, Flonase.  ? sick contacts.  No other aggravating or relieving factors.  No other c/o.  ROS as in subjective.   Objective: Vitals:   11/12/17 1105  BP: 110/68  Pulse: (!) 105  Resp: 16  Temp: 98.6 F (37 C)  SpO2: 98%    General appearance: Alert, WD/WN, no distress, mildly ill appearing                             Skin: warm, no rash                           Head: + frontal and maxillary sinus tenderness                            Eyes: conjunctiva normal, corneas clear, PERRLA                            Ears: pearly TMs, external ear canals normal                          Nose: septum midline, turbinates swollen, with erythema and thick discharge             Mouth/throat: MMM, tongue normal, mild pharyngeal erythema                           Neck: supple, no adenopathy, no thyromegaly, nontender                          Heart: RRR, normal S1, S2, no murmurs                         Lungs: CTA bilaterally, no wheezes, rales, or rhonchi      Assessment: Acute non-recurrent pansinusitis - Plan: azithromycin (ZITHROMAX Z-PAK) 250 MG tablet  Cough - Plan: promethazine-dextromethorphan (PROMETHAZINE-DM) 6.25-15 MG/5ML syrup    Plan: Discussed diagnosis and treatment of acute sinusitis and cough. Z-pak prescribed per request as this has worked in the past. Promethazine prescribed and precautions due to sedating nature.  Suggested symptomatic OTC  remedies.Nasal saline spray for congestion.  Tylenol or Ibuprofen OTC for fever and malaise.  Call/return if worse or not back to baseline in 10 days.

## 2017-11-20 ENCOUNTER — Other Ambulatory Visit: Payer: Self-pay | Admitting: *Deleted

## 2017-11-20 NOTE — Progress Notes (Signed)
Office Visit Note  Patient: Valerie Delgado             Date of Birth: Jun 23, 1965           MRN: [id number]             PCP: Girtha Rm, NP-C Referring: Girtha Rm, NP-C Visit Date: 11/26/2017 Occupation: @GUAROCC @  Subjective:  Right SI joint pain   History of Present Illness: Valerie Delgado is a 52 y.o. femalewith history of psoriatic arthritis.  She is on Enbrel 50 mg sq injections every week.  She denies any recent flares of psoriatic arthritis.  She states that she does have some patches of psoriasis on her scalp as well as neck pain.  She continues to have right hip pain and occasional groin pain.  She reports that she saw Dr. Lorin Mercy who ordered an MRI but her insurance would not cover it so she did not proceed.  She continues to have left [facility] joint pain.  She denies any Achilles tendinitis or plantar fasciitis.  Activities of Daily Living:  Patient reports morning stiffness for 30-60 minutes.   Patient Denies nocturnal pain.  Difficulty dressing/grooming: Denies Difficulty climbing stairs: Reports Difficulty getting out of chair: Reports Difficulty using hands for taps, buttons, cutlery, and/or writing: Denies  Review of Systems  Constitutional: Negative for fatigue.  HENT: Negative for mouth sores, mouth dryness and nose dryness.   Eyes: Negative for pain, visual disturbance and dryness.  Respiratory: Negative for cough, hemoptysis, shortness of breath and difficulty breathing.   Cardiovascular: Negative for chest pain, palpitations, hypertension and swelling in legs/feet.  Gastrointestinal: Negative for blood in stool, constipation and diarrhea.  Endocrine: Negative for increased urination.  Genitourinary: Negative for painful urination.  Musculoskeletal: Negative for arthralgias, joint pain, joint swelling, myalgias, muscle weakness, morning stiffness, muscle tenderness and myalgias.  Skin: Negative for color change, pallor, rash, hair loss, nodules/bumps, skin  tightness, ulcers and sensitivity to sunlight.  Allergic/Immunologic: Negative for susceptible to infections.  Neurological: Negative for dizziness, numbness, headaches and weakness.  Hematological: Negative for swollen glands.  Psychiatric/Behavioral: Negative for depressed mood and sleep disturbance. The patient is not nervous/anxious.     PMFS History:  Patient Active Problem List   Diagnosis Date Noted  . Decreased visual acuity 03/30/2017  . Estrogen deficiency 03/30/2017  . Inflammatory arthritis 11/13/2016  . Essential hypertension 09/19/2016  . History of anxiety and depression 09/19/2016  . History of Meniere's disease 09/19/2016  . History of hearing loss 09/19/2016  . History of ADHD 09/19/2016  . Dyspareunia, female 01/11/2016  . Routine general medical examination at a health care facility 01/11/2016  . Smoker 09/01/2015  . Anxiety and depression 09/01/2015  . Positive QuantiFERON-TB Gold test 09/01/2015  . History of herpes simplex infection 09/01/2015    Past Medical History:  Diagnosis Date  . Allergy   . Anxiety   . Arthritis   . Depression   . Hypertension   . Meniere's disease of left ear   . Psoriatic arthritis ([place])    Dr. Estanislado Pandy  . Tuberculosis    started treatment on 08/30/2015    Family History  Problem Relation Age of Onset  . Hypertension Mother   . Cancer Mother        breast  . Arthritis Mother   . Depression Mother   . Hypertension Father   . Hypertension Brother   . Stroke Brother   . Colon cancer Neg Hx   . Esophageal  cancer Neg Hx   . Rectal cancer Neg Hx   . Stomach cancer Neg Hx    Past Surgical History:  Procedure Laterality Date  . CESAREAN SECTION     Social History   Social History Narrative  . Not on file    Objective: Vital Signs: BP (!) 125/91 (BP Location: Left Arm, Patient Position: Sitting, Cuff Size: Normal)   Pulse 98   Resp 14   Ht 5\' 6"  (1.676 m)   Wt 160 lb 9.6 oz (72.8 kg)   BMI 25.92 kg/m     Physical Exam  Constitutional: She is oriented to person, place, and time. She appears well-developed and well-nourished.  HENT:  Head: Normocephalic and atraumatic.  Eyes: Conjunctivae and EOM are normal.  Neck: Normal range of motion.  Cardiovascular: Normal rate, regular rhythm, normal heart sounds and intact distal pulses.  Pulmonary/Chest: Effort normal and breath sounds normal.  Abdominal: Soft. Bowel sounds are normal.  Lymphadenopathy:    She has no cervical adenopathy.  Neurological: She is alert and oriented to person, place, and time.  Skin: Skin is warm and dry. Capillary refill takes less than 2 seconds.  Nail pitting noted. Patches of scalp psoriasis.  Psychiatric: She has a normal mood and affect. Her behavior is normal.  Nursing note and vitals reviewed.    Musculoskeletal Exam:C-spine ,thoracic spine, and lumbar spine good ROM.  No midline spinal tenderness. Right SI joint tenderness.  Shoulder joints, elbow joints, wrist joints, MCPs, PIPs, DIPs good range of motion with no synovitis.  She has PIP and DIP synovial thickening.  She has bilateral CMC joint synovial thickening.  Left CMC joint tenderness.  Patient is good range of motion with no discomfort.  No warmth or effusion noted.  No tenderness or swelling of ankle joints.  No Achilles tendinitis or plantar fasciitis.  She has pes planus bilaterally.  CDAI Exam: CDAI Score: 1.2  Patient Global Assessment: 8 (mm); Provider Global Assessment: 4 (mm) Swollen: 0 ; Tender: 0  Joint Exam   Not documented   There is currently no information documented on the homunculus. Go to the Rheumatology activity and complete the homunculus joint exam.  Investigation: No additional findings.  Imaging: No results found.  Recent Labs: Lab Results  Component Value Date   WBC 8.8 03/30/2017   HGB 13.5 03/30/2017   PLT 351 03/30/2017   NA 143 03/30/2017   K 4.8 03/30/2017   CL 105 03/30/2017   CO2 22 03/30/2017    GLUCOSE 97 03/30/2017   BUN 14 03/30/2017   CREATININE 0.66 03/30/2017   BILITOT 0.3 03/30/2017   ALKPHOS 65 03/30/2017   AST 16 03/30/2017   ALT 20 03/30/2017   PROT 6.7 03/30/2017   ALBUMIN 4.2 03/30/2017   CALCIUM 9.4 03/30/2017   GFRAA 117 03/30/2017    Speciality Comments: No specialty comments available.  Procedures:  No procedures performed Allergies: Sulfur   Assessment / Plan:     Visit Diagnoses: Psoriatic arthritis ([place]) - erosive disease. Positive synovitis on ultrasound. She is inadequate response to methotrexate: She has no synovitis or dactylitis on exam.  She has not had any Achilles tendinitis or plantar fasciitis.  She has right SI joint tenderness on exam today.  She has been injecting Enbrel 50 mg subcutaneously once weekly.  She has a few patches of psoriasis on her scalp as well as not picking.  She will continue on Enbrel 50 mg subcu once weekly.  A refill of Enbrel  will be sent to the pharmacy once her labs returned.  An x-ray will also be obtained today due to history of TB and cough for the past 3 weeks.  She was advised to notify us if she develops increased joint pain or joint swelling.  She will follow-up in the office in 5 minutes.  X-rays of hands and feet will be obtained at that time.  High risk medication use - Enbrel (methotrexate inadequate response).  CBC and CMP are drawn today to monitor for drug toxicity.  She will return in January and every 3 months for lab work.  A chest x-ray will also be obtained due to history of TB.  She has had a cough for the past 3 weeks and is currently on Enbrel.- Plan: CBC with Differential/Platelet, COMPLETE METABOLIC PANEL WITH GFR, DG Chest 2 View  ANA positive - low titer 1:40 nuclear speckled.    Positive QuantiFERON-TB Gold test - She was previously treated with rifampin for 4 months. A chest x-ray was ordered today.  She has had a cough for 3 weeks and is currently on Enbrel sq injections once weekly. - Plan: DG  Chest 2 View  Cough - She has had a cough x3 weeks.  She has history of TB and is currently on Enbrel.  She continues to smoke. Plan: DG Chest 2 View   Smoker -CXR ordered today.  Plan: DG Chest 2 View  Other medical conditions are listed as follows:   History of Meniere's disease  Essential hypertension  History of ADHD  History of herpes simplex infection  History of anxiety and depression  History of hearing loss    Orders: Orders Placed This Encounter  Procedures  . DG Chest 2 View  . CBC with Differential/Platelet  . COMPLETE METABOLIC PANEL WITH GFR   No orders of the defined types were placed in this encounter.   Face-to-face time spent with patient was30 minutes. Greater than 50% of time was spent in counseling and coordination of care.  Follow-Up Instructions: Return in about 5 months (around 04/27/2018) for Psoriatic arthritis.   Ofilia Neas, PA-C  Note - This record has been created using Dragon software.  Chart creation errors have been sought, but may not always  have been located. Such creation errors do not reflect on  the standard of medical care.

## 2017-11-20 NOTE — Telephone Encounter (Signed)
Refill request received via fax from [place]. Patient has not been seen in office since 12/21/2016. No follow up visit scheduled. Will send message to front to schedule patient a follow up visit. Will not be able to send refill at this time.

## 2017-11-26 ENCOUNTER — Encounter: Payer: Self-pay | Admitting: Physician Assistant

## 2017-11-26 ENCOUNTER — Ambulatory Visit: Payer: 59 | Admitting: Physician Assistant

## 2017-11-26 VITALS — BP 125/91 | HR 98 | Resp 14 | Ht 66.0 in | Wt 160.6 lb

## 2017-11-26 DIAGNOSIS — R05 Cough: Secondary | ICD-10-CM

## 2017-11-26 DIAGNOSIS — R7612 Nonspecific reaction to cell mediated immunity measurement of gamma interferon antigen response without active tuberculosis: Secondary | ICD-10-CM | POA: Diagnosis not present

## 2017-11-26 DIAGNOSIS — F17200 Nicotine dependence, unspecified, uncomplicated: Secondary | ICD-10-CM

## 2017-11-26 DIAGNOSIS — R768 Other specified abnormal immunological findings in serum: Secondary | ICD-10-CM | POA: Diagnosis not present

## 2017-11-26 DIAGNOSIS — Z8669 Personal history of other diseases of the nervous system and sense organs: Secondary | ICD-10-CM

## 2017-11-26 DIAGNOSIS — Z79899 Other long term (current) drug therapy: Secondary | ICD-10-CM | POA: Diagnosis not present

## 2017-11-26 DIAGNOSIS — L4050 Arthropathic psoriasis, unspecified: Secondary | ICD-10-CM | POA: Diagnosis not present

## 2017-11-26 DIAGNOSIS — R059 Cough, unspecified: Secondary | ICD-10-CM

## 2017-11-26 DIAGNOSIS — Z8659 Personal history of other mental and behavioral disorders: Secondary | ICD-10-CM

## 2017-11-26 DIAGNOSIS — I10 Essential (primary) hypertension: Secondary | ICD-10-CM

## 2017-11-26 DIAGNOSIS — Z8619 Personal history of other infectious and parasitic diseases: Secondary | ICD-10-CM

## 2017-11-27 LAB — COMPLETE METABOLIC PANEL WITH GFR
AG Ratio: 1.5 (calc) (ref 1.0–2.5)
ALBUMIN MSPROF: 4.5 g/dL (ref 3.6–5.1)
ALT: 20 U/L (ref 6–29)
AST: 19 U/L (ref 10–35)
Alkaline phosphatase (APISO): 63 U/L (ref 33–130)
BILIRUBIN TOTAL: 0.5 mg/dL (ref 0.2–1.2)
BUN: 16 mg/dL (ref 7–25)
CALCIUM: 10.3 mg/dL (ref 8.6–10.4)
CHLORIDE: 99 mmol/L (ref 98–110)
CO2: 28 mmol/L (ref 20–32)
CREATININE: 0.66 mg/dL (ref 0.50–1.05)
GFR, EST AFRICAN AMERICAN: 116 mL/min/{1.73_m2} (ref >=60)
GFR, EST NON AFRICAN AMERICAN: 100 mL/min/{1.73_m2} (ref >=60)
GLUCOSE: 92 mg/dL (ref 65–99)
Globulin: 3 g/dL (calc) (ref 1.9–3.7)
Potassium: 5 mmol/L (ref 3.5–5.3)
Sodium: 137 mmol/L (ref 135–146)
TOTAL PROTEIN: 7.5 g/dL (ref 6.1–8.1)

## 2017-11-27 LAB — CBC WITH DIFFERENTIAL/PLATELET
BASOS ABS: 49 {cells}/uL (ref 0–200)
Basophils Relative: 0.4 %
EOS ABS: 62 {cells}/uL (ref 15–500)
Eosinophils Relative: 0.5 %
HCT: 41.8 % (ref 35.0–45.0)
Hemoglobin: 13.8 g/dL (ref 11.7–15.5)
Lymphs Abs: 2878 cells/uL (ref 850–3900)
MCH: 30 pg (ref 27.0–33.0)
MCHC: 33 g/dL (ref 32.0–36.0)
MCV: 90.9 fL (ref 80.0–100.0)
MONOS PCT: 5.4 %
MPV: 10.8 fL (ref 7.5–12.5)
NEUTROS PCT: 70.3 %
Neutro Abs: 8647 cells/uL — ABNORMAL HIGH (ref 1500–7800)
Platelets: 380 10*3/uL (ref 140–400)
RBC: 4.6 10*6/uL (ref 3.80–5.10)
RDW: 11.9 % (ref 11.0–15.0)
TOTAL LYMPHOCYTE: 23.4 %
WBC mixed population: 664 cells/uL (ref 200–950)
WBC: 12.3 10*3/uL — ABNORMAL HIGH (ref 3.8–10.8)

## 2017-11-27 NOTE — Progress Notes (Signed)
WBC count is mildly elevated.  Please ask if patient has had any recent infections.  If she has signs and symptoms of an infection.  Please advise her to follow up with PCP and hold Enbrel dose until infection has cleared.  All other labs are WNL.

## 2017-11-30 ENCOUNTER — Telehealth: Payer: Self-pay | Admitting: Family Medicine

## 2017-11-30 MED ORDER — AZITHROMYCIN 250 MG PO TABS: ORAL_TABLET | ORAL | 0 refills | Status: DC

## 2017-11-30 NOTE — Telephone Encounter (Signed)
Pt states she did get some better but thinks still has sinus infection.  Would like another antibiotic called into [facility].

## 2017-11-30 NOTE — Telephone Encounter (Signed)
Sent in med and tried to call pt but unable to leave vm to let her know med is called in and if not feeling better after this to come back in

## 2017-11-30 NOTE — Telephone Encounter (Signed)
Ok to give her another round but if not back to baseline after completing it, she will need to be seen. Thanks.

## 2017-11-30 NOTE — Telephone Encounter (Signed)
Please tell me what antibotic to send in. Not listed in her med lis

## 2017-11-30 NOTE — Telephone Encounter (Signed)
Z pak

## 2017-12-27 DIAGNOSIS — K219 Gastro-esophageal reflux disease without esophagitis: Secondary | ICD-10-CM | POA: Diagnosis not present

## 2017-12-31 DIAGNOSIS — R0982 Postnasal drip: Secondary | ICD-10-CM | POA: Diagnosis not present

## 2017-12-31 DIAGNOSIS — J3489 Other specified disorders of nose and nasal sinuses: Secondary | ICD-10-CM | POA: Diagnosis not present

## 2018-02-20 HISTORY — PX: MOUTH SURGERY: SHX715

## 2018-03-04 ENCOUNTER — Ambulatory Visit: Payer: 59 | Admitting: Family Medicine

## 2018-03-04 ENCOUNTER — Encounter: Payer: Self-pay | Admitting: Family Medicine

## 2018-03-04 ENCOUNTER — Other Ambulatory Visit: Payer: Self-pay | Admitting: Family Medicine

## 2018-03-04 ENCOUNTER — Other Ambulatory Visit: Payer: Self-pay

## 2018-03-04 VITALS — BP 128/98 | HR 105 | Temp 98.5°F | Ht 66.0 in | Wt 163.6 lb

## 2018-03-04 DIAGNOSIS — M1990 Unspecified osteoarthritis, unspecified site: Secondary | ICD-10-CM | POA: Diagnosis not present

## 2018-03-04 DIAGNOSIS — F17200 Nicotine dependence, unspecified, uncomplicated: Secondary | ICD-10-CM

## 2018-03-04 DIAGNOSIS — F32A Depression, unspecified: Secondary | ICD-10-CM

## 2018-03-04 DIAGNOSIS — Z1231 Encounter for screening mammogram for malignant neoplasm of breast: Secondary | ICD-10-CM

## 2018-03-04 DIAGNOSIS — E2839 Other primary ovarian failure: Secondary | ICD-10-CM

## 2018-03-04 DIAGNOSIS — F419 Anxiety disorder, unspecified: Secondary | ICD-10-CM | POA: Diagnosis not present

## 2018-03-04 DIAGNOSIS — F329 Major depressive disorder, single episode, unspecified: Secondary | ICD-10-CM

## 2018-03-04 DIAGNOSIS — I10 Essential (primary) hypertension: Secondary | ICD-10-CM | POA: Diagnosis not present

## 2018-03-04 MED ORDER — LISINOPRIL-HYDROCHLOROTHIAZIDE 20-12.5 MG PO TABS: 1.0000 | ORAL_TABLET | Freq: Every day | ORAL | 1 refills | Status: DC

## 2018-03-04 NOTE — Patient Instructions (Addendum)
Your blood pressure is elevated. I am increasing your blood pressure medication dosage.  Start the new dose tomorrow.   Call and schedule with a counselor as discussed.   You are scheduled for a mammogram and bone density at the [facility] on the corner of [address] on February 14th at 7:10 am. Your first test is at 7:30 am and the other will be right after it.    You may want to call and ask your insurance carrier to see if the Shingrix vaccine is affordable. This is a 2 shot series.  I recommend that you also check with Dr. Estanislado Pandy also to make sure you should receive this vaccine.   Return in 4 weeks to recheck your blood pressure.   You can call to schedule your appointment with a counselor. A few offices are listed below for you to call.   [facility] [place] [address] [phone]   [place]  [place]  (across from [facility])  [facility] [address] [place], [place] [address] [facility]  [place], [place], [place] [address]  Phone: [phone]   [place], [place] [address]  Phone: [phone]       DASH Eating Plan DASH stands for "Dietary Approaches to Stop Hypertension." The DASH eating plan is a healthy eating plan that has been shown to reduce high blood pressure (hypertension). It may also reduce your risk for type 2 diabetes, heart disease, and stroke. The DASH eating plan may also help with weight loss. What are tips for following this plan?  General guidelines  Avoid eating more than 2,300 mg (milligrams) of salt (sodium) a day. If you have hypertension, you may need to reduce your sodium intake to 1,500 mg a day.  Limit alcohol intake to no more than 1 drink a day for  nonpregnant women and 2 drinks a day for men. One drink equals 12 oz of beer, 5 oz of wine, or 1 oz of hard liquor.  Work with your health care provider to maintain a healthy body weight or to lose weight. Ask what an ideal weight is for you.  Get at least 30 minutes of exercise that causes your heart to beat faster (aerobic exercise) most days of the week. Activities may include walking, swimming, or biking.  Work with your health care provider or diet and nutrition specialist (dietitian) to adjust your eating plan to your individual calorie needs. Reading food labels   Check food labels for the amount of sodium per serving. Choose foods with less than 5 percent of the Daily Value of sodium. Generally, foods with less than 300 mg of sodium per serving fit into this eating plan.  To find whole grains, look for the word "whole" as the first word in the ingredient list. Shopping  Buy products labeled as "low-sodium" or "no salt added."  Buy fresh foods. Avoid canned foods and premade or frozen meals. Cooking  Avoid adding salt when cooking. Use salt-free seasonings or herbs instead of table salt or sea salt. Check with your health care provider or pharmacist before using salt substitutes.  Do not fry foods. Cook foods using healthy methods such as baking, boiling, grilling, and broiling instead.  Cook with  heart-healthy oils, such as olive, canola, soybean, or sunflower oil. Meal planning  Eat a balanced diet that includes: ? 5 or more servings of fruits and vegetables each day. At each meal, try to fill half of your plate with fruits and vegetables. ? Up to 6-8 servings of whole grains each day. ? Less than 6 oz of lean meat, poultry, or fish each day. A 3-oz serving of meat is about the same size as a deck of cards. One egg equals 1 oz. ? 2 servings of low-fat dairy each day. ? A serving of nuts, seeds, or beans 5 times each week. ? Heart-healthy fats. Healthy fats called Omega-3  fatty acids are found in foods such as flaxseeds and coldwater fish, like sardines, salmon, and mackerel.  Limit how much you eat of the following: ? Canned or prepackaged foods. ? Food that is high in trans fat, such as fried foods. ? Food that is high in saturated fat, such as fatty meat. ? Sweets, desserts, sugary drinks, and other foods with added sugar. ? Full-fat dairy products.  Do not salt foods before eating.  Try to eat at least 2 vegetarian meals each week.  Eat more home-cooked food and less restaurant, buffet, and fast food.  When eating at a restaurant, ask that your food be prepared with less salt or no salt, if possible. What foods are recommended? The items listed may not be a complete list. Talk with your dietitian about what dietary choices are best for you. Grains Whole-grain or whole-wheat bread. Whole-grain or whole-wheat pasta. Brown rice. [place]row. Bulgur. Whole-grain and low-sodium cereals. Pita bread. Low-fat, low-sodium crackers. Whole-wheat flour tortillas. Vegetables Fresh or frozen vegetables (raw, steamed, roasted, or grilled). Low-sodium or reduced-sodium tomato and vegetable juice. Low-sodium or reduced-sodium tomato sauce and tomato paste. Low-sodium or reduced-sodium canned vegetables. Fruits All fresh, dried, or frozen fruit. Canned fruit in natural juice (without added sugar). Meat and other protein foods Skinless chicken or [place]. Ground chicken or [place]. Pork with fat trimmed off. Fish and seafood. Egg whites. Dried beans, peas, or lentils. Unsalted nuts, nut butters, and seeds. Unsalted canned beans. Lean cuts of beef with fat trimmed off. Low-sodium, lean deli meat. Dairy Low-fat (1%) or fat-free (skim) milk. Fat-free, low-fat, or reduced-fat cheeses. Nonfat, low-sodium ricotta or cottage cheese. Low-fat or nonfat yogurt. Low-fat, low-sodium cheese. Fats and oils Soft margarine without trans fats. Vegetable oil. Low-fat, reduced-fat, or  light mayonnaise and salad dressings (reduced-sodium). Canola, safflower, olive, soybean, and sunflower oils. Avocado. Seasoning and other foods Herbs. Spices. Seasoning mixes without salt. Unsalted popcorn and pretzels. Fat-free sweets. What foods are not recommended? The items listed may not be a complete list. Talk with your dietitian about what dietary choices are best for you. Grains Baked goods made with fat, such as croissants, muffins, or some breads. Dry pasta or rice meal packs. Vegetables Creamed or fried vegetables. Vegetables in a cheese sauce. Regular canned vegetables (not low-sodium or reduced-sodium). Regular canned tomato sauce and paste (not low-sodium or reduced-sodium). Regular tomato and vegetable juice (not low-sodium or reduced-sodium). Angie Fava. Olives. Fruits Canned fruit in a light or heavy syrup. Fried fruit. Fruit in cream or butter sauce. Meat and other protein foods Fatty cuts of meat. Ribs. Fried meat. Berniece Salines. Sausage. Bologna and other processed lunch meats. Salami. Fatback. Hotdogs. Bratwurst. Salted nuts and seeds. Canned beans with added salt. Canned or smoked fish. Whole eggs or egg yolks. Chicken or [place] with skin. Dairy Whole or 2%  milk, cream, and half-and-half. Whole or full-fat cream cheese. Whole-fat or sweetened yogurt. Full-fat cheese. Nondairy creamers. Whipped toppings. Processed cheese and cheese spreads. Fats and oils Butter. Stick margarine. Lard. Shortening. Ghee. Bacon fat. Tropical oils, such as coconut, palm kernel, or palm oil. Seasoning and other foods Salted popcorn and pretzels. Onion salt, garlic salt, seasoned salt, table salt, and sea salt. Worcestershire sauce. Tartar sauce. Barbecue sauce. Teriyaki sauce. Soy sauce, including reduced-sodium. Steak sauce. Canned and packaged gravies. Fish sauce. Oyster sauce. Cocktail sauce. Horseradish that you find on the shelf. Ketchup. Mustard. Meat flavorings and tenderizers. Bouillon cubes. Hot  sauce and Tabasco sauce. Premade or packaged marinades. Premade or packaged taco seasonings. Relishes. Regular salad dressings. Where to find more information:  National Heart, Lung, and [place]: https://wilson-eaton.com/  American Heart Association: www.heart.org Summary  The DASH eating plan is a healthy eating plan that has been shown to reduce high blood pressure (hypertension). It may also reduce your risk for type 2 diabetes, heart disease, and stroke.  With the DASH eating plan, you should limit salt (sodium) intake to 2,300 mg a day. If you have hypertension, you may need to reduce your sodium intake to 1,500 mg a day.  When on the DASH eating plan, aim to eat more fresh fruits and vegetables, whole grains, lean proteins, low-fat dairy, and heart-healthy fats.  Work with your health care provider or diet and nutrition specialist (dietitian) to adjust your eating plan to your individual calorie needs. This information is not intended to replace advice given to you by your health care provider. Make sure you discuss any questions you have with your health care provider. Document Released: 01/26/2011 Document Revised: 01/31/2016 Document Reviewed: 01/31/2016 Elsevier Interactive Patient Education  2019 [employer].

## 2018-03-04 NOTE — Progress Notes (Signed)
   Subjective:    Patient ID: Valerie Delgado, female    DOB: Apr 09, 1965, 53 y.o.   MRN: [id number]  HPI Chief Complaint  Patient presents with  . Follow-up  . Immunizations    shingrix   She is here for a 6 month follow up on chronic health conditions including HTN.  She does not check her blood pressure at home.  States she does not have a cuff.  Reports good daily compliance with medication.  She did take her medication this morning. Elevated systolic today and in October as well.  States her blood pressure is probably high because she has been feeling anxious lately.  States she feels like she is in a rut right now and needs some "coaching".  Seeing Dr. Toy Care but not involved in counseling. Taking Xanax once daily per patient. Also reports taking Adderall for ADHD. Her psychiatric medications are all prescribed by Dr. Toy Care.  Denies SI or HI.   She is not on Enbrel right now. Would like to get the Shingrix vaccine but has not checked with her insurance carrier to see about the cost.  States she lacks motivation right now.  Denies alcohol use.  She is still smoking and up to three quarters of a pack per day.  No plans to stop right now.  Requests that we call and schedule her mammogram and bone density. She did not do this from her CPE last year but would like to.   Denies fever, chills, dizziness, chest pain, palpitations, shortness of breath, abdominal pain, N/V/D, urinary symptoms, LE edema.   Reviewed allergies, medications, past medical, surgical, family, and social history.    Review of Systems Pertinent positives and negatives in the history of present illness.     Objective:   Physical Exam BP (!) 128/98   Pulse (!) 105   Temp 98.5 F (36.9 C) (Oral)   Ht 5\' 6"  (1.676 m)   Wt 163 lb 9.6 oz (74.2 kg)   SpO2 96%   BMI 26.41 kg/m   Alert and oriented and in no distress. Pharyngeal area is normal. Neck is supple without adenopathy or thyromegaly. Cardiac exam shows a  regular sinus rhythm without murmurs or gallops. Lungs are clear to auscultation.  Extremities without edema.  Skin is warm and dry.  Her mood is anxious however her thought process and speech is normal.      Assessment & Plan:  Essential hypertension  Inflammatory arthritis  Smoker  Anxiety and depression  I personally rechecked her blood pressure at the end of our visit and it was still elevated at 134/100. Discussed that we will increase lisinopril to 20 mg and keep her HCTZ at the same dosing. Encouraged her to start checking her blood pressure at home. Counseling on healthy lifestyle including low-sodium diet, physical activity and stopping smoking. She is visibly anxious today and I recommend counseling for this.  We will give her a list of several counselors to call and schedule her appointment. Discussed that she should call and check with her insurance carrier regarding the expense of the Shingrix vaccine.  I would also prefer that she check with her rheumatologist to see if this would be appropriate. We called and schedule her mammogram and bone density appointment for February 14 per her request. She will follow-up in 4 weeks for recheck of blood pressure.  She will also see her psychiatrist as scheduled.

## 2018-04-05 ENCOUNTER — Ambulatory Visit
Admission: RE | Admit: 2018-04-05 | Discharge: 2018-04-05 | Disposition: A | Discharge status: Home / Self Care | Payer: 59 | Source: Ambulatory Visit | Attending: Family Medicine | Admitting: Family Medicine

## 2018-04-05 DIAGNOSIS — Z1231 Encounter for screening mammogram for malignant neoplasm of breast: Secondary | ICD-10-CM | POA: Diagnosis not present

## 2018-04-05 DIAGNOSIS — E2839 Other primary ovarian failure: Secondary | ICD-10-CM

## 2018-04-05 DIAGNOSIS — Z780 Asymptomatic menopausal state: Secondary | ICD-10-CM | POA: Diagnosis not present

## 2018-04-05 DIAGNOSIS — M85852 Other specified disorders of bone density and structure, left thigh: Secondary | ICD-10-CM | POA: Diagnosis not present

## 2018-04-10 ENCOUNTER — Encounter: Payer: Self-pay | Admitting: Family Medicine

## 2018-04-10 ENCOUNTER — Ambulatory Visit: Payer: 59 | Admitting: Family Medicine

## 2018-04-10 VITALS — BP 110/70 | HR 111 | Wt 162.2 lb

## 2018-04-10 DIAGNOSIS — L989 Disorder of the skin and subcutaneous tissue, unspecified: Secondary | ICD-10-CM | POA: Diagnosis not present

## 2018-04-10 DIAGNOSIS — M5441 Lumbago with sciatica, right side: Secondary | ICD-10-CM | POA: Diagnosis not present

## 2018-04-10 DIAGNOSIS — I10 Essential (primary) hypertension: Secondary | ICD-10-CM | POA: Diagnosis not present

## 2018-04-10 NOTE — Progress Notes (Signed)
   Subjective:    Patient ID: Valerie Delgado, female    DOB: [birth year], 53 y.o.   MRN: [id number]  HPI Chief Complaint  Patient presents with  . 4 week follow-up    4 week follow-up for HTN   She is here to follow up on HTN. She has several other complaints that she would like to discuss today.   Taking lisinopril-HCTZ 20-12.5 mg daily with no side effects.   She is on 2 stimulants and her pulse is elevated. States she feels fine.   Bilateral low back pain that initially started as right low back pain that feels deep into her right buttock and occasionally has an electrical sensation that radiates down the posterior right leg.  History of low back pain.  States she saw Dr. Lorin Mercy last year for this.  She completed physical therapy and her symptoms improved somewhat.  She has been exercising recently 2 days/week. States she has been wearing a back brace that her friend gave her that is supposed to "help her lose weight". No numbness, tingling or weakness.  She sits mostly at work and this is when the pain flares up.  Pain improves with movement generally.  She is taking ibuprofen and using a heating pad.  She has an appointment with Dr. Estanislado Pandy on March 3.   Denies fever, chills, dizziness, chest pain, palpitations, shortness of breath, abdominal pain, N/V/D, urinary symptoms, LE edema.   Reviewed allergies, medications, past medical, surgical, family, and social history.    Review of Systems Pertinent positives and negatives in the history of present illness.     Objective:   Physical Exam BP 110/70   Pulse (!) 111   Wt 162 lb 3.2 oz (73.6 kg)   BMI 26.18 kg/m   Alert and oriented and in no acute distress. Normal work of breathing. Skin is warm and dry. Left foot with a open skin ulcer to the top of the dorsal aspect of her foot without signs of infection. No drainage. Area is consistent with where her shoe is rubbing her foot. Low back with normal sensation and  ROM. Gait  is normal.       Assessment & Plan:  Essential hypertension  Acute bilateral low back pain with right-sided sciatica  Skin lesion of foot  Discussed that her blood pressure is in goal range and I recommend that she continue on her current medication.  Her heart rate is mildly elevated and this appears to be due to the fact that she took her Adderall and Sudafed this morning prior to her visit. Recurrent low back pain with right sciatica.  No red flag symptoms.  She has been wearing a friend's back brace and I advised her to stop using this.  Recommend conservative treatment.  She has been to physical therapy for this in the past and I recommend that she start back on the physical therapy exercises.  She will follow-up with Dr. Renato Gails scheduled in early March. Left foot ulceration that appears to be due to friction from her shoe.  No sign of infection.  Advised her to use a barrier or a different shoe that does not rub that area.  She will continue using topical antibiotic and keeping it covered.  She will let me know if there are any signs of infection. Follow-up here for a fasting CPE in 2 to 3 months.

## 2018-04-10 NOTE — Patient Instructions (Signed)
Your blood pressure is normal today at 110/70.  Continue on your current medications.  Your pulse is elevated but this is most likely due to your morning medications. Make sure you are well-hydrated.  Keep the area on your left foot covered and okay to keep using the antibiotic ointment.  Let me know if this is worsening.  For your low back pain-avoid wearing the back brace.  Continue with the physical therapy exercises.  Take ibuprofen, use the heating pad and you may also try topical pain medication such as salon Pas or Biofreeze.  Follow-up with your orthopedist as scheduled.

## 2018-04-15 NOTE — Progress Notes (Signed)
Office Visit Note  Patient: Valerie Delgado             Date of Birth: [birth year]           MRN: [id number]             PCP: Girtha Rm, NP-C Referring: Girtha Rm, NP-C Visit Date: 04/29/2018 Occupation: @GUAROCC @  Subjective:  Lower back pain    History of Present Illness: Valerie Delgado is a 53 y.o. female with history of psoriatic arthritis.  She has been off of Enbrel since October 2019.  She states that prior to discontinued she was having recurrent infections and held her dose but has not restarted.  She reports she is having severe low back pain.  She states that it is starting around her tailbone radiating into her buttocks bilaterally.  She states that in October 2018 she was evaluated by Shelda Altes who ordered an x-ray.  She reports that she was placed to be scheduled for an MRI but her insurance would not cover and she could not afford at that time.  She states that she tried physical therapy which improved her pain temporarily.  She has progressively been having increased pain.  She denies any recent injuries or falls.  She states that the pain is more severe when transitioning from a sitting to standing position.  She denies any numbness or tingling. She reports that she is having bilateral CMC joint pain.  He has noticed decreased grip strength.  She denies any joint swelling.  She denies any Achilles tendinitis or plantar fasciitis.  She reports that she has patches of psoriasis on her scalp but has not been using any topical agents.  She denies any other joint pain or joint swelling at this time.    Activities of Daily Living:  Patient reports morning stiffness for 30 minutes.   Patient Reports nocturnal pain.  Difficulty dressing/grooming: Denies Difficulty climbing stairs: Reports Difficulty getting out of chair: Reports Difficulty using hands for taps, buttons, cutlery, and/or writing: Denies  Review of Systems  Constitutional: Positive for fatigue.  HENT:  Positive for mouth dryness. Negative for mouth sores and nose dryness.   Eyes: Positive for dryness. Negative for pain and visual disturbance.  Respiratory: Negative for cough, hemoptysis, shortness of breath and difficulty breathing.   Cardiovascular: Negative for chest pain, palpitations, hypertension and swelling in legs/feet.  Gastrointestinal: Negative for blood in stool, constipation and diarrhea.  Endocrine: Negative for increased urination.  Genitourinary: Negative for painful urination.  Musculoskeletal: Positive for arthralgias, joint pain and morning stiffness. Negative for joint swelling, myalgias, muscle weakness, muscle tenderness and myalgias.  Skin: Positive for rash (Psoriasis ). Negative for color change, pallor, hair loss, nodules/bumps, skin tightness, ulcers and sensitivity to sunlight.  Allergic/Immunologic: Negative for susceptible to infections.  Neurological: Negative for dizziness, numbness, headaches and weakness.  Hematological: Negative for swollen glands.  Psychiatric/Behavioral: Negative for depressed mood and sleep disturbance. The patient is not nervous/anxious.     PMFS History:  Patient Active Problem List   Diagnosis Date Noted  . Decreased visual acuity 03/30/2017  . Estrogen deficiency 03/30/2017  . Inflammatory arthritis 11/13/2016  . Essential hypertension 09/19/2016  . History of anxiety and depression 09/19/2016  . History of Meniere's disease 09/19/2016  . History of hearing loss 09/19/2016  . History of ADHD 09/19/2016  . Dyspareunia, female 01/11/2016  . Routine general medical examination at a health care facility 01/11/2016  . Smoker 09/01/2015  . Anxiety and  depression 09/01/2015  . Positive QuantiFERON-TB Gold test 09/01/2015  . History of herpes simplex infection 09/01/2015    Past Medical History:  Diagnosis Date  . Allergy   . Anxiety   . Arthritis   . Depression   . Hypertension   . Meniere's disease of left ear   .  Psoriatic arthritis ([place])    Dr. Estanislado Pandy  . Tuberculosis    started treatment on 08/30/2015    Family History  Problem Relation Age of Onset  . Hypertension Mother   . Cancer Mother        breast  . Arthritis Mother   . Depression Mother   . Hypertension Father   . Hypertension Brother   . Stroke Brother   . Colon cancer Neg Hx   . Esophageal cancer Neg Hx   . Rectal cancer Neg Hx   . Stomach cancer Neg Hx    Past Surgical History:  Procedure Laterality Date  . CESAREAN SECTION    . MOUTH SURGERY  2020   tooth extraction/ bone graft    Social History   Social History Narrative  . Not on file   Immunization History  Administered Date(s) Administered  . Influenza,inj,Quad PF,6+ Mos 01/11/2016, 03/30/2017  . Influenza-Unspecified 01/23/2018  . Tdap 08/21/2014     Objective: Vital Signs: BP 125/80 (BP Location: Right Arm, Patient Position: Sitting, Cuff Size: Normal)   Pulse 84   Resp 14   Ht 5\' 6"  (1.676 m)   Wt 163 lb (73.9 kg)   BMI 26.31 kg/m    Physical Exam Vitals signs and nursing note reviewed.  Constitutional:      Appearance: She is well-developed.  HENT:     Head: Normocephalic and atraumatic.  Eyes:     Conjunctiva/sclera: Conjunctivae normal.  Neck:     Musculoskeletal: Normal range of motion.  Cardiovascular:     Rate and Rhythm: Normal rate and regular rhythm.     Heart sounds: Normal heart sounds.  Pulmonary:     Effort: Pulmonary effort is normal.     Breath sounds: Normal breath sounds.  Abdominal:     General: Bowel sounds are normal.     Palpations: Abdomen is soft.  Lymphadenopathy:     Cervical: No cervical adenopathy.  Skin:    General: Skin is warm and dry.     Capillary Refill: Capillary refill takes less than 2 seconds.  Neurological:     Mental Status: She is alert and oriented to person, place, and time.  Psychiatric:        Behavior: Behavior normal.      Musculoskeletal Exam: C-spine good range of motion with  crepitus.  Thoracic and lumbar spine limited range of motion with discomfort.  She has midline spinal tenderness in lumbar region.  No SI joint tenderness.  She does have evidence of restricted MCPs, PIPs, DIPs good range of motion no synovitis.  She has tenderness and synovial thickening of bilateral CMC joints.  She has PIP and DIP synovial thickening.  Hip joints, knee joints, ankle joints MTPs, PIPs and DIPs good range of motion with no synovitis.  No warmth or effusion bilateral knee joints.  She has bilateral knee crepitus.  No tender swelling of ankle joints.  No Achilles tenderness or plantar fasciitis.  She has PIP and DIP synovial thickening consistent with osteoarthritis of bilateral feet.  CDAI Exam: CDAI Score: Not documented Patient Global Assessment: Not documented; Provider Global Assessment: Not documented Swollen: Not documented;  Tender: Not documented Joint Exam   Not documented   There is currently no information documented on the homunculus. Go to the Rheumatology activity and complete the homunculus joint exam.  Investigation: No additional findings.  Imaging: Dg Chest 2 View  Result Date: 04/25/2018 CLINICAL DATA:  Remote history of TB. EXAM: CHEST - 2 VIEW COMPARISON:  12/18/2016 FINDINGS: The cardiac silhouette, mediastinal and hilar contours are within normal limits and stable. Low lung volumes with vascular crowding and streaky basilar atelectasis. No definite infiltrates, edema or effusions. No worrisome pulmonary lesions. The bony thorax is intact. IMPRESSION: No acute cardiopulmonary findings. Slightly low lung volumes with mild vascular crowding and streaky basilar atelectasis. Electronically Signed   By: Marijo Sanes M.D.   On: 04/25/2018 12:55   Dg Bone Density (dxa)  Result Date: 04/05/2018 EXAM: DUAL X-RAY ABSORPTIOMETRY (DXA) FOR BONE MINERAL DENSITY IMPRESSION: Referring Physician:  Girtha Rm Your patient completed a BMD test using Lunar IDXA DXA  system ( analysis version: 16 ) manufactured by [employer]. Technologist: AW PATIENT: Name: Renette, Hsu Patient ID: [id number] Birth Date: 05-09-1965 Height: 66.0 in. Sex: Female Measured: 04/05/2018 Weight: 163.2 lbs. Indications: Adderall, cymbalta, Estrogen Deficient, Postmenopausal, Tobacco User (Current Smoker) Fractures: None Treatments: None ASSESSMENT: The BMD measured at Femur Total Left is 0.880 g/cm2 with a T-score of -1.0. This patient is considered normal according to [facility]) criteria. The scan quality is good. Site Region Measured Date Measured Age YA BMD Significant CHANGE T-score AP Spine  L1-L4      2/05/2018    54.7         -0.8    1.079 g/cm2 DualFemur Total Left 04/05/2018    54.7         -1.0    0.880 g/cm2 DualFemur Total Mean 2/05/2018    54.7         -1.0    0.882 g/cm2 World Health Organization [facility]) criteria for post-menopausal, Caucasian Women: Normal       T-score at or above -1 SD Osteopenia   T-score between -1 and -2.5 SD Osteoporosis T-score at or below -2.5 SD RECOMMENDATION: 1. All patients should optimize calcium and vitamin D intake. 2. Consider FDA approved medical therapies in postmenopausal women and men aged 57 years and older, based on the following: a. A hip or vertebral (clinical or morphometric) fracture b. T- score < or = -2.5 at the femoral neck or spine after appropriate evaluation to exclude secondary causes c. Low bone mass (T-score between -1.0 and -2.5 at the femoral neck or spine) and a 10 year probability of a hip fracture > or = 3% or a 10 year probability of a major osteoporosis-related fracture > or = 20% based on the [place]-adapted WHO algorithm d. Clinician judgment and/or patient preferences may indicate treatment for people with 10-year fracture probabilities above or below these levels FOLLOW-UP: People with diagnosed cases of osteoporosis or at high risk for fracture should have regular bone mineral density tests. For patients  eligible for Medicare, routine testing is allowed once every 2 years. The testing frequency can be increased to one year for patients who have rapidly progressing disease, those who are receiving or discontinuing medical therapy to restore bone mass, or have additional risk factors. Electronically Signed   By: Earle Gell M.D.   On: 04/05/2018 09:39   Mm Digital Screening Bilateral  Result Date: 04/05/2018 CLINICAL DATA:  Screening. EXAM: DIGITAL SCREENING BILATERAL MAMMOGRAM WITH CAD COMPARISON:  Previous exam(s). ACR Breast Density Category c: The breast tissue is heterogeneously dense, which may obscure small masses. FINDINGS: There are no findings suspicious for malignancy. Images were processed with CAD. IMPRESSION: No mammographic evidence of malignancy. A result letter of this screening mammogram will be mailed directly to the patient. RECOMMENDATION: Screening mammogram in one year. (Code:SM-B-01Y) BI-RADS CATEGORY  1: Negative. Electronically Signed   By: Ammie Ferrier M.D.   On: 04/05/2018 15:37    Recent Labs: Lab Results  Component Value Date   WBC 11.1 (H) 04/25/2018   HGB 14.2 04/25/2018   PLT 416 (H) 04/25/2018   NA 137 04/25/2018   K 4.0 04/25/2018   CL 100 04/25/2018   CO2 27 04/25/2018   GLUCOSE 105 (H) 04/25/2018   BUN 21 04/25/2018   CREATININE 0.75 04/25/2018   BILITOT 0.5 04/25/2018   ALKPHOS 65 03/30/2017   AST 15 04/25/2018   ALT 13 04/25/2018   PROT 6.6 04/25/2018   ALBUMIN 4.2 03/30/2017   CALCIUM 9.6 04/25/2018   GFRAA 105 04/25/2018    Speciality Comments: No specialty comments available.  Procedures:  No procedures performed Allergies: Sulfur    Assessment / Plan:     Visit Diagnoses: Psoriatic arthritis ([place]) - Erosive disease: She has no synovitis or dactylitis on exam.  She has not had any recent psoriatic arthritis flares. She discontinued Enbrel injections in October 2019 due to experiencing recurrent infections and not wanting to resume  weekly injections.  She has no achilles tendonitis or plantar fasciitis.  No SI joint tenderness.  She has mild patches of psoriasis on her scalp, but she declined use of a topical agent.  She has bilateral CMC joint synovial thickening and tenderness due to osteoarthritis.  She has noticed decreased grip strength.  She does not want to restart Enbrel at this time.  She presents today with severe lower back pain.  She has midline spinal tenderness and radiating pain bilaterally.  She was advised to follow up with Dr.   Dellia Nims risk medication use -  D/c Enbrel in October 2019 due to recurrent infections. (methotrexate inadequate response).  History of TB and last chest x-ray showed no active disease on 12/18/2016.  She was due for repeat CXR in November but has not obtained.  Most recent CBC/CMP within normal limits except for slightly elevated WBC on 11/26/2017.   - Plan: CBC with Differential/Platelet, COMPLETE METABOLIC PANEL WITH GFR, CBC with Differential/Platelet, COMPLETE METABOLIC PANEL WITH GFR  ANA positive - 1:40 speckled: She has no other features of autoimmune disease at this time.   Chronic midline low back pain with bilateral sciatica: She has midline spinal tenderness in the lumbar region that has progressively been worsening since October 2018.  She has attended PT in the past.  She was supposed to be scheduled for a MRI previously but her insurance would not cover a MRI.  She has radiating pain bilaterally.  She has no numbness or tingling.  She reports the pain is most severe when transitioning from a seated to standing position.  She has not had any recent falls or injuries.  She was advised to follow up with Dr. Lorin Mercy who evaluated her in October 2018 for a similar concern.   Other medical conditions are listed as follows:   Positive QuantiFERON-TB Gold test - treated with Rifampin for 4 months  History of Meniere's disease  Smoker  Essential hypertension  History of  ADHD  History of herpes simplex infection  History of anxiety and depression    Orders: No orders of the defined types were placed in this encounter.  No orders of the defined types were placed in this encounter.    Follow-Up Instructions: Return in about 5 months (around 09/29/2018) for Psoriatic arthritis.   Ofilia Neas, PA-C  Note - This record has been created using Dragon software.  Chart creation errors have been sought, but may not always  have been located. Such creation errors do not reflect on  the standard of medical care.

## 2018-04-18 ENCOUNTER — Telehealth: Payer: Self-pay | Admitting: Rheumatology

## 2018-04-18 NOTE — Telephone Encounter (Signed)
Patient was suppose to go get a CXR after last visit, but did not. Patient would like for the order to be put in again if need to be so she can go within the next week. Please call patient to advise.

## 2018-04-18 NOTE — Telephone Encounter (Signed)
Returned patient's call.  Informed patient that her order is still valid and she should go get her chest x-ray at her earliest convenience.  She was to have the chest x-ray before the new year.  She is also due for labs.  Patient states she will get her chest x-ray and labs next week prior to her upcoming appointment on March 9.  Will discuss adherence and routine lab monitoring at office visit.  All questions encouraged and answered.  Instructed patient to call with any further questions or concerns.  Mariella Saa, PharmD, [facility] Rheumatology Clinical Pharmacist  04/18/2018 5:01 PM

## 2018-04-25 ENCOUNTER — Ambulatory Visit (HOSPITAL_COMMUNITY)
Admission: RE | Admit: 2018-04-25 | Discharge: 2018-04-25 | Disposition: A | Discharge status: Home / Self Care | Payer: 59 | Source: Ambulatory Visit | Attending: Physician Assistant | Admitting: Physician Assistant

## 2018-04-25 ENCOUNTER — Other Ambulatory Visit: Payer: Self-pay | Admitting: *Deleted

## 2018-04-25 DIAGNOSIS — F17200 Nicotine dependence, unspecified, uncomplicated: Secondary | ICD-10-CM | POA: Diagnosis present

## 2018-04-25 DIAGNOSIS — R059 Cough, unspecified: Secondary | ICD-10-CM

## 2018-04-25 DIAGNOSIS — R05 Cough: Secondary | ICD-10-CM | POA: Insufficient documentation

## 2018-04-25 DIAGNOSIS — J9811 Atelectasis: Secondary | ICD-10-CM | POA: Diagnosis not present

## 2018-04-25 DIAGNOSIS — Z79899 Other long term (current) drug therapy: Secondary | ICD-10-CM

## 2018-04-25 DIAGNOSIS — R7612 Nonspecific reaction to cell mediated immunity measurement of gamma interferon antigen response without active tuberculosis: Secondary | ICD-10-CM | POA: Insufficient documentation

## 2018-04-25 NOTE — Progress Notes (Signed)
CXR did not reveal any acute cardiopulmonary findings.  There was evidence of slightly low lung volumes with mild vascular crowding.  Please notify patient and forward to PCP for further evaluation.

## 2018-04-26 LAB — COMPLETE METABOLIC PANEL WITH GFR
AG Ratio: 1.8 (calc) (ref 1.0–2.5)
ALT: 13 U/L (ref 6–29)
AST: 15 U/L (ref 10–35)
Albumin: 4.2 g/dL (ref 3.6–5.1)
Alkaline phosphatase (APISO): 59 U/L (ref 37–153)
BUN: 21 mg/dL (ref 7–25)
CO2: 27 mmol/L (ref 20–32)
Calcium: 9.6 mg/dL (ref 8.6–10.4)
Chloride: 100 mmol/L (ref 98–110)
Creat: 0.75 mg/dL (ref 0.50–1.05)
GFR, Est African American: 105 mL/min/{1.73_m2} (ref >=60)
GFR, Est Non African American: 90 mL/min/{1.73_m2} (ref >=60)
GLUCOSE: 105 mg/dL — AB (ref 65–99)
Globulin: 2.4 g/dL (calc) (ref 1.9–3.7)
Potassium: 4 mmol/L (ref 3.5–5.3)
Sodium: 137 mmol/L (ref 135–146)
Total Bilirubin: 0.5 mg/dL (ref 0.2–1.2)
Total Protein: 6.6 g/dL (ref 6.1–8.1)

## 2018-04-26 LAB — CBC WITH DIFFERENTIAL/PLATELET
Absolute Monocytes: 688 cells/uL (ref 200–950)
Basophils Absolute: 56 cells/uL (ref 0–200)
Basophils Relative: 0.5 %
Eosinophils Absolute: 122 cells/uL (ref 15–500)
Eosinophils Relative: 1.1 %
HCT: 42.2 % (ref 35.0–45.0)
Hemoglobin: 14.2 g/dL (ref 11.7–15.5)
Lymphs Abs: 3530 cells/uL (ref 850–3900)
MCH: 30.7 pg (ref 27.0–33.0)
MCHC: 33.6 g/dL (ref 32.0–36.0)
MCV: 91.1 fL (ref 80.0–100.0)
MONOS PCT: 6.2 %
MPV: 10.6 fL (ref 7.5–12.5)
Neutro Abs: 6704 cells/uL (ref 1500–7800)
Neutrophils Relative %: 60.4 %
Platelets: 416 10*3/uL — ABNORMAL HIGH (ref 140–400)
RBC: 4.63 10*6/uL (ref 3.80–5.10)
RDW: 11.7 % (ref 11.0–15.0)
Total Lymphocyte: 31.8 %
WBC: 11.1 10*3/uL — ABNORMAL HIGH (ref 3.8–10.8)

## 2018-04-26 NOTE — Progress Notes (Signed)
WNLs

## 2018-04-29 ENCOUNTER — Ambulatory Visit: Payer: 59 | Admitting: Physician Assistant

## 2018-04-29 ENCOUNTER — Encounter: Payer: Self-pay | Admitting: Physician Assistant

## 2018-04-29 VITALS — BP 125/80 | HR 84 | Resp 14 | Ht 66.0 in | Wt 163.0 lb

## 2018-04-29 DIAGNOSIS — Z8669 Personal history of other diseases of the nervous system and sense organs: Secondary | ICD-10-CM

## 2018-04-29 DIAGNOSIS — R7612 Nonspecific reaction to cell mediated immunity measurement of gamma interferon antigen response without active tuberculosis: Secondary | ICD-10-CM | POA: Diagnosis not present

## 2018-04-29 DIAGNOSIS — R768 Other specified abnormal immunological findings in serum: Secondary | ICD-10-CM | POA: Diagnosis not present

## 2018-04-29 DIAGNOSIS — I10 Essential (primary) hypertension: Secondary | ICD-10-CM

## 2018-04-29 DIAGNOSIS — Z79899 Other long term (current) drug therapy: Secondary | ICD-10-CM

## 2018-04-29 DIAGNOSIS — G8929 Other chronic pain: Secondary | ICD-10-CM

## 2018-04-29 DIAGNOSIS — M5441 Lumbago with sciatica, right side: Secondary | ICD-10-CM

## 2018-04-29 DIAGNOSIS — Z8619 Personal history of other infectious and parasitic diseases: Secondary | ICD-10-CM

## 2018-04-29 DIAGNOSIS — F17200 Nicotine dependence, unspecified, uncomplicated: Secondary | ICD-10-CM

## 2018-04-29 DIAGNOSIS — L4050 Arthropathic psoriasis, unspecified: Secondary | ICD-10-CM | POA: Diagnosis not present

## 2018-04-29 DIAGNOSIS — M5442 Lumbago with sciatica, left side: Secondary | ICD-10-CM

## 2018-04-29 DIAGNOSIS — Z8659 Personal history of other mental and behavioral disorders: Secondary | ICD-10-CM

## 2018-05-01 ENCOUNTER — Other Ambulatory Visit: Payer: Self-pay | Admitting: Family Medicine

## 2018-05-08 ENCOUNTER — Ambulatory Visit (INDEPENDENT_AMBULATORY_CARE_PROVIDER_SITE_OTHER): Payer: 59 | Admitting: Orthopaedic Surgery

## 2018-07-10 ENCOUNTER — Encounter: Payer: 59 | Admitting: Family Medicine

## 2018-09-26 NOTE — Telephone Encounter (Signed)
error 

## 2018-11-05 ENCOUNTER — Other Ambulatory Visit: Payer: Self-pay

## 2018-11-05 DIAGNOSIS — Z20822 Contact with and (suspected) exposure to covid-19: Secondary | ICD-10-CM

## 2018-11-07 LAB — NOVEL CORONAVIRUS, NAA: SARS-CoV-2, NAA: NOT DETECTED

## 2019-01-20 IMAGING — DX DG CHEST 2V
2 series · 2 of 2 positions shown · non-contrast
Comparison: PA and lateral chest 08/05/2015.

CLINICAL DATA: Psoriatic arthritis. History of tuberculosis.
Smoker.

EXAM:
CHEST  2 VIEW

[chest pa]
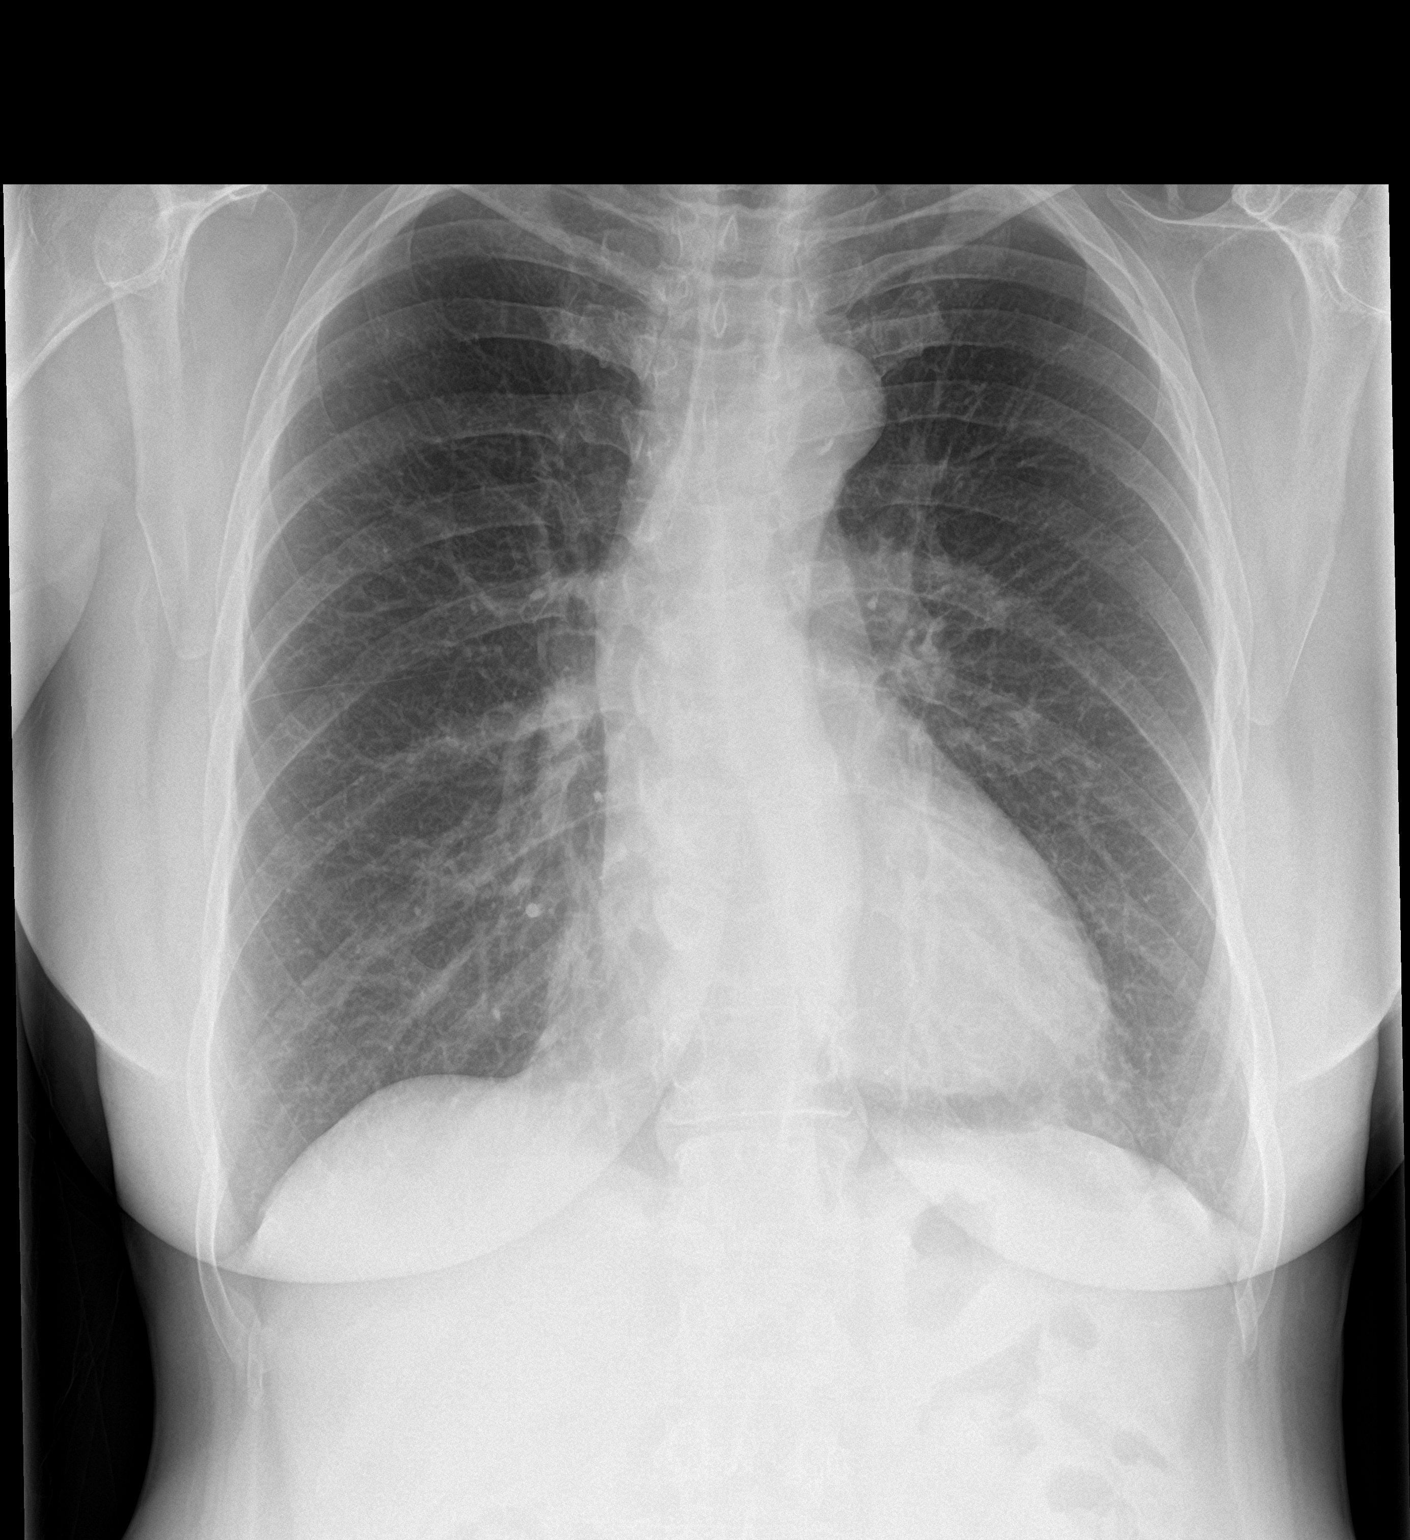

[chest lat]
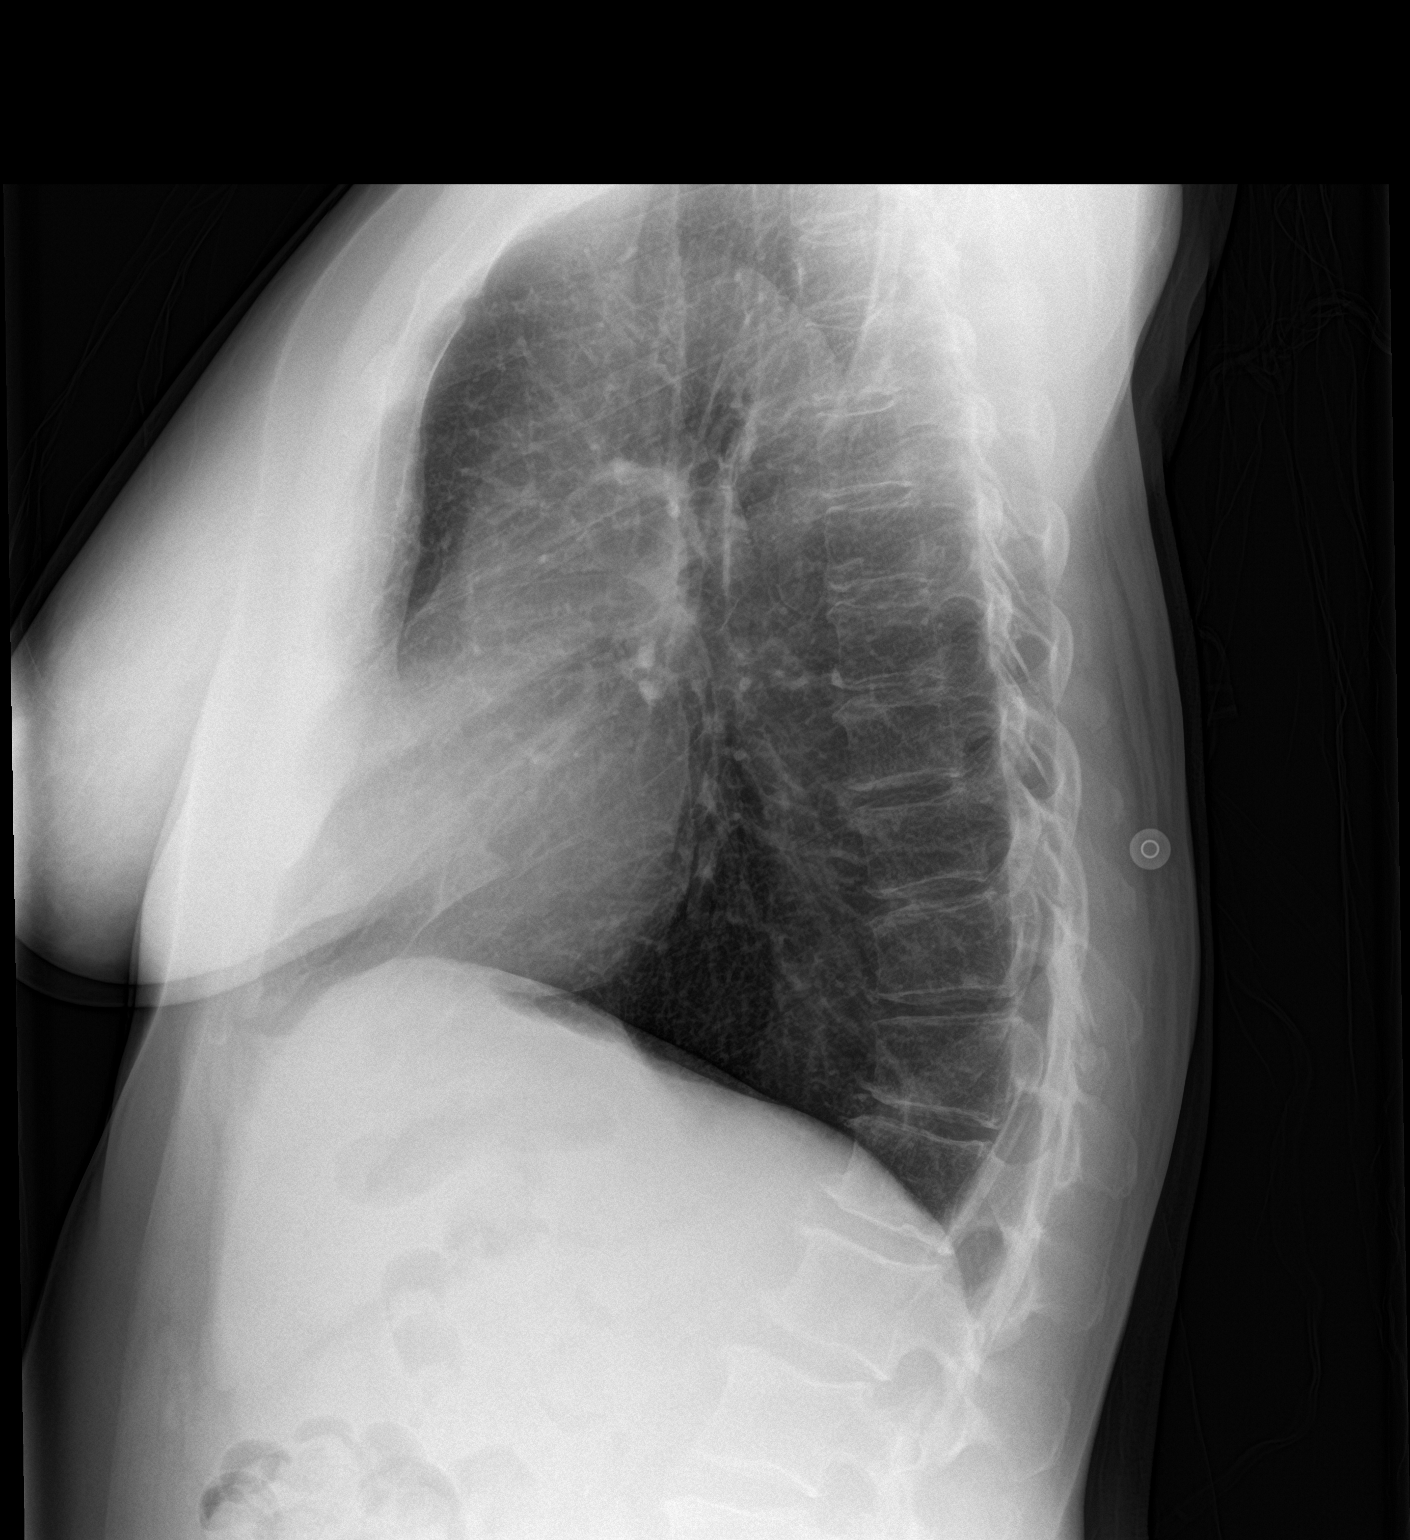

[2 of 2 positions shown; findings below may reference images not displayed]

FINDINGS: The lungs are clear. Heart size is normal. No pneumothorax or
pleural fluid. Aortic atherosclerosis is noted. No acute bony
abnormality. Kyphosis centered about the T11-12 level is unchanged.
IMPRESSION: No acute disease.

Atherosclerosis.

## 2019-01-27 ENCOUNTER — Other Ambulatory Visit: Payer: Self-pay

## 2019-01-27 DIAGNOSIS — Z20822 Contact with and (suspected) exposure to covid-19: Secondary | ICD-10-CM

## 2019-01-29 LAB — NOVEL CORONAVIRUS, NAA: SARS-CoV-2, NAA: NOT DETECTED

## 2019-07-24 ENCOUNTER — Other Ambulatory Visit: Payer: Self-pay

## 2019-07-24 DIAGNOSIS — Y999 Unspecified external cause status: Secondary | ICD-10-CM | POA: Insufficient documentation

## 2019-07-24 DIAGNOSIS — M545 Low back pain: Secondary | ICD-10-CM | POA: Diagnosis present

## 2019-07-24 DIAGNOSIS — Y929 Unspecified place or not applicable: Secondary | ICD-10-CM | POA: Diagnosis not present

## 2019-07-24 DIAGNOSIS — M62830 Muscle spasm of back: Secondary | ICD-10-CM | POA: Insufficient documentation

## 2019-07-24 DIAGNOSIS — Y9301 Activity, walking, marching and hiking: Secondary | ICD-10-CM | POA: Insufficient documentation

## 2019-07-24 DIAGNOSIS — X58XXXA Exposure to other specified factors, initial encounter: Secondary | ICD-10-CM | POA: Diagnosis not present

## 2019-07-24 DIAGNOSIS — Z79899 Other long term (current) drug therapy: Secondary | ICD-10-CM | POA: Insufficient documentation

## 2019-07-24 DIAGNOSIS — Z79891 Long term (current) use of opiate analgesic: Secondary | ICD-10-CM | POA: Insufficient documentation

## 2019-07-24 DIAGNOSIS — S39012A Strain of muscle, fascia and tendon of lower back, initial encounter: Secondary | ICD-10-CM | POA: Insufficient documentation

## 2019-07-24 DIAGNOSIS — F17210 Nicotine dependence, cigarettes, uncomplicated: Secondary | ICD-10-CM | POA: Diagnosis not present

## 2019-07-24 DIAGNOSIS — I10 Essential (primary) hypertension: Secondary | ICD-10-CM | POA: Diagnosis not present

## 2019-07-25 ENCOUNTER — Emergency Department (HOSPITAL_COMMUNITY): Payer: 59

## 2019-07-25 ENCOUNTER — Emergency Department (HOSPITAL_COMMUNITY)
Admission: EM | Admit: 2019-07-25 | Discharge: 2019-07-25 | Disposition: A | Discharge status: Home / Self Care | Payer: 59 | Attending: Emergency Medicine | Admitting: Emergency Medicine

## 2019-07-25 ENCOUNTER — Encounter (HOSPITAL_COMMUNITY): Payer: Self-pay

## 2019-07-25 DIAGNOSIS — T148XXA Other injury of unspecified body region, initial encounter: Secondary | ICD-10-CM

## 2019-07-25 DIAGNOSIS — M62830 Muscle spasm of back: Secondary | ICD-10-CM

## 2019-07-25 MED ORDER — METHOCARBAMOL 500 MG PO TABS: 500.0000 mg | ORAL_TABLET | Freq: Two times a day (BID) | ORAL | 0 refills | Status: AC

## 2019-07-25 NOTE — ED Provider Notes (Signed)
[facility] DEPT Provider Note   CSN: [id number] Arrival date & time: 07/24/19  2353     History Chief Complaint  Patient presents with  . Back Pain    lower back pain    Valerie Delgado is a 54 y.o. female.  HPI   Patient presents emergency  department with chief complaint of lower back pain.  Patient states the pain has been chronic but 2 days ago the pain has increasingly gotten worse.  Patient states that when she was walking down the stairs and got into her car her back pain really started to bother her.  She states that this is the worst it it has ever felt and describes the pain as a burning sensation that moves up her back and down her left leg.  Admits to having a abnormal gait due to the pain as well as occasional numbness in her feet.  Patient has history of rheumatoid arthritis and sciatica and states that this feels worse than her normal sciatica flareups.  Patient denies fever, unintentional weight gain or weight loss, night sweats chills, chest pain, shortness of breath, abdominal pain, incontinency, or loss of bowels.  She denies the use of drugs, admits to smoking and drinking 1 glass of wine a night.  Past Medical History:  Diagnosis Date  . Allergy   . Anxiety   . Arthritis   . Depression   . Hypertension   . Meniere's disease of left ear   . Psoriatic arthritis ([place])    Dr. Estanislado Pandy  . Tuberculosis    started treatment on 08/30/2015    Patient Active Problem List   Diagnosis Date Noted  . Decreased visual acuity 03/30/2017  . Estrogen deficiency 03/30/2017  . Inflammatory arthritis 11/13/2016  . Essential hypertension 09/19/2016  . History of anxiety and depression 09/19/2016  . History of Meniere's disease 09/19/2016  . History of hearing loss 09/19/2016  . History of ADHD 09/19/2016  . Dyspareunia, female 01/11/2016  . Routine general medical examination at a health care facility 01/11/2016  . Smoker 09/01/2015  .  Anxiety and depression 09/01/2015  . Positive QuantiFERON-TB Gold test 09/01/2015  . History of herpes simplex infection 09/01/2015    Past Surgical History:  Procedure Laterality Date  . CESAREAN SECTION    . MOUTH SURGERY  2020   tooth extraction/ bone graft      OB History   No obstetric history on file.     Family History  Problem Relation Age of Onset  . Hypertension Mother   . Cancer Mother        breast  . Arthritis Mother   . Depression Mother   . Hypertension Father   . Hypertension Brother   . Stroke Brother   . Colon cancer Neg Hx   . Esophageal cancer Neg Hx   . Rectal cancer Neg Hx   . Stomach cancer Neg Hx     Social History   Tobacco Use  . Smoking status: Current Every Day Smoker    Packs/day: 0.50    Years: 30.00    Pack years: 15.00  . Smokeless tobacco: Never Used  Substance Use Topics  . Alcohol use: Yes    Comment: occ  . Drug use: No    Home Medications Prior to Admission medications   Medication Sig Start Date End Date Taking? Authorizing Provider  ALPRAZolam Duanne Moron) 1 MG tablet Take 1 mg by mouth 2 (two) times daily as needed.  [provider]  amphetamine-dextroamphetamine (ADDERALL) 30 MG tablet Take 30 mg by mouth 2 (two) times daily.    [provider]  DULoxetine (CYMBALTA) 60 MG capsule Take 120 mg by mouth daily.    [provider]  HYDROcodone-acetaminophen (NORCO/VICODIN) 5-325 MG tablet TK 1 T PO Q 6 H PRF PAIN 04/19/18   [provider]  ibuprofen (ADVIL,MOTRIN) 200 MG tablet Take 200 mg by mouth every 6 (six) hours as needed.    [provider]  ibuprofen (ADVIL,MOTRIN) 600 MG tablet as needed. 04/19/18   [provider]  lisinopril-hydrochlorothiazide (PRINZIDE,ZESTORETIC) 20-12.5 MG tablet TAKE 1 TABLET BY MOUTH DAILY 05/01/18   Henson, Vickie L, NP-C  methocarbamol (ROBAXIN) 500 MG tablet Take 1 tablet (500 mg total) by mouth 2 (two) times daily for 10 days. 07/25/19  08/04/19  Marcello Fennel, PA-C  penicillin v potassium (VEETID) 500 MG tablet TK 1 T PO QID UNTIL GONE 04/19/18   [provider]  pseudoephedrine (SUDAFED) 30 MG tablet Take 30 mg by mouth every 4 (four) hours as needed for congestion.    [provider]  valACYclovir (VALTREX) 500 MG tablet TAKE 1 TABLET(500 MG) BY MOUTH TWICE DAILY Patient taking differently: as needed.  07/30/17   Henson, Vickie L, NP-C    Allergies    Sulfur  Review of Systems   Review of Systems  Constitutional: Negative for chills and fever.  HENT: Negative for congestion and sore throat.   Eyes: Negative for photophobia.  Respiratory: Negative for cough and shortness of breath.   Cardiovascular: Negative for chest pain and leg swelling.  Gastrointestinal: Negative for abdominal pain, diarrhea, nausea and vomiting.  Genitourinary: Negative for difficulty urinating, dysuria, enuresis and flank pain.  Musculoskeletal: Positive for back pain.       Admits to lower back pain that has increasingly gotten worse over the last 2 days.  Skin: Negative for rash.  Neurological: Negative for dizziness, light-headedness and headaches.  Hematological: Does not bruise/bleed easily.    Physical Exam Updated Vital Signs BP (!) 126/92 (BP Location: Left Arm)   Pulse 82   Temp 98.6 F (37 C) (Oral)   Resp 18   Ht 5\' 6"  (1.676 m)   Wt 73.5 kg   SpO2 96%   BMI 26.15 kg/m   Physical Exam Vitals and nursing note reviewed.  Constitutional:      General: She is not in acute distress.    Appearance: She is not ill-appearing.  HENT:     Head: Normocephalic and atraumatic.     Nose: No congestion.     Mouth/Throat:     Mouth: Mucous membranes are moist.     Pharynx: Oropharynx is clear.  Eyes:     General: No scleral icterus. Cardiovascular:     Rate and Rhythm: Normal rate and regular rhythm.     Pulses: Normal pulses.     Heart sounds: No murmur. No friction rub. No gallop.   Pulmonary:      Effort: No respiratory distress.     Breath sounds: No wheezing, rhonchi or rales.  Abdominal:     General: There is no distension.     Tenderness: There is no abdominal tenderness. There is no guarding.  Musculoskeletal:        General: No swelling.     Comments: Patient is tender to palpation along her lower back by her sacrum.  There was no rash, bruising, lesions or other abnormalities noted.  She  had good range of motion with her back.  Patient's legs bilaterally had good range of motion, equal strength, good capillary refills, good pulse, and sensation intact.  Skin:    General: Skin is warm and dry.     Capillary Refill: Capillary refill takes less than 2 seconds.     Findings: No rash.  Neurological:     General: No focal deficit present.     Mental Status: She is alert.  Psychiatric:        Mood and Affect: Mood normal.     ED Results / Procedures / Treatments   Labs (all labs ordered are listed, but only abnormal results are displayed) Labs Reviewed - No data to display  EKG None  Radiology DG Lumbar Spine Complete  Result Date: 07/25/2019 CLINICAL DATA:  Acute lower back pain EXAM: LUMBAR SPINE - COMPLETE 4+ VIEW COMPARISON:  None. FINDINGS: There is no evidence of lumbar spine fracture. Alignment is normal. Mild disc height loss with facet arthrosis seen in the lower lumbar spine. Scattered vascular calcifications are noted. IMPRESSION: Negative. Electronically Signed   By: Prudencio Pair M.D.   On: 07/25/2019 05:38    Procedures Procedures (including critical care time)  Medications Ordered in ED Medications - No data to display  ED Course  I have reviewed the triage vital signs and the nursing notes.  Pertinent labs & imaging results that were available during my care of the patient were reviewed by me and considered in my medical decision making (see chart for details).    MDM Rules/Calculators/A&P                     I have personally reviewed all imaging,  labs and have interpreted them.  I have low suspicion for spinal abscess patient denies fevers, chills, denies use of drugs, skin exam did not show any signs of track marks or rashes.  Low suspicion for spina equina as the patient denies any incontinence or loss of bowels, she denies numbness or tingling between her legs and is able to walk without difficulty.low suspicion for neoplasm as she denies any recent weight gain or weight loss night sweats unexplained fevers.   low suspicion for spinal fracture as x-ray did not show any dislocations, fractures, abnormalities.  Patient does not appear to be in any acute distress, she is resting comfortably in bed, not having difficulty breathing.  I have suspicion that the patient's pain is more skeletal muscular and further evaluation is needed.  Patient does not meet acute criteria for admission to the hospital.  Instructed patient to follow-up with her primary care doctor for further evaluation. patient was given strict home care instructions as well as return precautions.  Patient was explained the results and plan, patient verbalized that she understood and agrees with above plan.  Final Clinical Impression(s) / ED Diagnoses Final diagnoses:  Muscle spasm of back  Muscle strain    Rx / DC Orders ED Discharge Orders         Ordered    methocarbamol (ROBAXIN) 500 MG tablet  2 times daily     07/25/19 0517           Marcello Fennel, PA-C 07/25/19 0548    Mesner, Corene Cornea, MD 07/25/19 [id number]

## 2019-07-25 NOTE — ED Triage Notes (Signed)
Arrived POV from home. Patient reports lower back pain over past 5 days. Patient states pain with walking, lying down, and sitting

## 2019-07-25 NOTE — Discharge Instructions (Signed)
You have been seen for lower back pain.  I have prescribed you a muscle relaxer please take as prescribed.  Do not drive or operate heavy machinery and do not drink while taking this medication.  I also want to alternate been taking ibuprofen 400 mg and Tylenol 1000 mg daily every 6 hours.  For example first take ibuprofen, then wait 6 hours and then take Tylenol and repeat.  I want you to apply heat to the area and stretch it as this can help with inflammation and pain.  I want you to follow-up with your primary doctor as I feel you will need further work-up for your back pain.  I want to come back to emergency department if you develop fever, chest pain, shortness of breath, numbness in your legs, become incontinent, lose control of your bowels, inability to walk, vomiting, diarrhea, as the symptoms require further evaluations.

## 2019-07-29 ENCOUNTER — Ambulatory Visit: Payer: Self-pay

## 2019-07-29 ENCOUNTER — Ambulatory Visit: Payer: 59 | Admitting: Rheumatology

## 2019-07-29 ENCOUNTER — Other Ambulatory Visit: Payer: Self-pay

## 2019-07-29 ENCOUNTER — Encounter: Payer: Self-pay | Admitting: Rheumatology

## 2019-07-29 VITALS — BP 138/96 | HR 113 | Resp 14 | Ht 66.0 in | Wt 157.8 lb

## 2019-07-29 DIAGNOSIS — M51369 Other intervertebral disc degeneration, lumbar region without mention of lumbar back pain or lower extremity pain: Secondary | ICD-10-CM

## 2019-07-29 DIAGNOSIS — I10 Essential (primary) hypertension: Secondary | ICD-10-CM

## 2019-07-29 DIAGNOSIS — Z8659 Personal history of other mental and behavioral disorders: Secondary | ICD-10-CM

## 2019-07-29 DIAGNOSIS — G8929 Other chronic pain: Secondary | ICD-10-CM | POA: Diagnosis not present

## 2019-07-29 DIAGNOSIS — Z79899 Other long term (current) drug therapy: Secondary | ICD-10-CM | POA: Diagnosis not present

## 2019-07-29 DIAGNOSIS — M79642 Pain in left hand: Secondary | ICD-10-CM

## 2019-07-29 DIAGNOSIS — R768 Other specified abnormal immunological findings in serum: Secondary | ICD-10-CM | POA: Diagnosis not present

## 2019-07-29 DIAGNOSIS — M79672 Pain in left foot: Secondary | ICD-10-CM | POA: Diagnosis not present

## 2019-07-29 DIAGNOSIS — M79671 Pain in right foot: Secondary | ICD-10-CM | POA: Diagnosis not present

## 2019-07-29 DIAGNOSIS — L4050 Arthropathic psoriasis, unspecified: Secondary | ICD-10-CM | POA: Diagnosis not present

## 2019-07-29 DIAGNOSIS — F17200 Nicotine dependence, unspecified, uncomplicated: Secondary | ICD-10-CM

## 2019-07-29 DIAGNOSIS — M79641 Pain in right hand: Secondary | ICD-10-CM | POA: Diagnosis not present

## 2019-07-29 DIAGNOSIS — M25561 Pain in right knee: Secondary | ICD-10-CM | POA: Diagnosis not present

## 2019-07-29 DIAGNOSIS — Z8619 Personal history of other infectious and parasitic diseases: Secondary | ICD-10-CM

## 2019-07-29 DIAGNOSIS — M5136 Other intervertebral disc degeneration, lumbar region: Secondary | ICD-10-CM | POA: Diagnosis not present

## 2019-07-29 DIAGNOSIS — M25562 Pain in left knee: Secondary | ICD-10-CM

## 2019-07-29 DIAGNOSIS — Z8669 Personal history of other diseases of the nervous system and sense organs: Secondary | ICD-10-CM

## 2019-07-29 DIAGNOSIS — R7612 Nonspecific reaction to cell mediated immunity measurement of gamma interferon antigen response without active tuberculosis: Secondary | ICD-10-CM

## 2019-07-29 DIAGNOSIS — R7689 Other specified abnormal immunological findings in serum: Secondary | ICD-10-CM

## 2019-07-29 NOTE — Progress Notes (Signed)
Office Visit Note  Patient: Valerie Delgado             Date of Birth: 1965-11-04           MRN: [id number]             PCP: Glenis Smoker, MD Referring: Glenis Smoker, * Visit Date: 07/29/2019 Occupation: @GUAROCC @  Subjective:  Other (joint pain, swelling, stiffness. last visit in 04/2018. previously on enbrel )   History of Present Illness: Valerie Delgado is a 54 y.o. female with history of psoriatic arthritis and osteoarthritis.  She has been off Enbrel since October 2019.  Enbrel was stopped due to frequent infections.  She has been off all the medication since then.  She states she lost her job during the Covid times and has not been working.  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  She states for the last few weeks she has been having increased lower back pain which has been radiating down into her lower extremities.  She was seen in the emergency room last week where she had x-rays and was told that the x-rays were normal and was given muscle relaxers.  She continues to have lower back pain and radiculopathy.  She states her arthritis is getting worse with increased pain in her right shoulder, both hands and both knees.  She has had some psoriasis in her scalp.  Activities of Daily Living:  Patient reports morning stiffness for 24 hours.   Patient Reports nocturnal pain.  Difficulty dressing/grooming: Reports Difficulty climbing stairs: Reports Difficulty getting out of chair: Reports Difficulty using hands for taps, buttons, cutlery, and/or writing: Reports  Review of Systems  Constitutional: Positive for fatigue. Negative for night sweats, weight gain and weight loss.  HENT: Positive for mouth dryness. Negative for mouth sores, trouble swallowing, trouble swallowing and nose dryness.   Eyes: Negative for pain, redness, itching, visual disturbance and dryness.  Respiratory: Negative for cough, shortness of breath and difficulty breathing.     Cardiovascular: Negative for chest pain, palpitations, hypertension, irregular heartbeat and swelling in legs/feet.  Gastrointestinal: Negative for blood in stool, constipation and diarrhea.  Endocrine: Negative for increased urination.  Genitourinary: Negative for difficulty urinating and vaginal dryness.  Musculoskeletal: Positive for arthralgias, joint pain, joint swelling, myalgias, morning stiffness and myalgias. Negative for muscle weakness and muscle tenderness.  Skin: Positive for rash. Negative for color change, hair loss, redness, skin tightness, ulcers and sensitivity to sunlight.       Psoriasis  Allergic/Immunologic: Negative for susceptible to infections.  Neurological: Negative for dizziness, numbness, headaches, memory loss, night sweats and weakness.  Hematological: Negative for bruising/bleeding tendency and swollen glands.  Psychiatric/Behavioral: Negative for depressed mood, confusion and sleep disturbance. The patient is not nervous/anxious.     PMFS History:  Patient Active Problem List   Diagnosis Date Noted  . Decreased visual acuity 03/30/2017  . Estrogen deficiency 03/30/2017  . Inflammatory arthritis 11/13/2016  . Essential hypertension 09/19/2016  . History of anxiety and depression 09/19/2016  . History of Meniere's disease 09/19/2016  . History of hearing loss 09/19/2016  . History of ADHD 09/19/2016  . Dyspareunia, female 01/11/2016  . Routine general medical examination at a health care facility 01/11/2016  . Smoker 09/01/2015  . Anxiety and depression 09/01/2015  . Positive QuantiFERON-TB Gold test 09/01/2015  . History of herpes simplex infection 09/01/2015    Past Medical History:  Diagnosis Date  .  Allergy   . Anxiety   . Arthritis   . Depression   . Hypertension   . Meniere's disease of left ear   . Psoriatic arthritis ([place])    Dr. Estanislado Pandy  . Tuberculosis    started treatment on 08/30/2015    Family History  Problem Relation Age of  Onset  . Hypertension Mother   . Cancer Mother        breast  . Arthritis Mother   . Depression Mother   . Hypertension Father   . Hypertension Brother   . Stroke Brother   . Colon cancer Neg Hx   . Esophageal cancer Neg Hx   . Rectal cancer Neg Hx   . Stomach cancer Neg Hx    Past Surgical History:  Procedure Laterality Date  . CESAREAN SECTION    . MOUTH SURGERY  2020   tooth extraction/ bone graft    Social History   Social History Narrative  . Not on file   Immunization History  Administered Date(s) Administered  . Influenza,inj,Quad PF,6+ Mos 01/11/2016, 03/30/2017  . Influenza-Unspecified 01/23/2018  . Tdap 08/21/2014     Objective: Vital Signs: BP (!) 138/96 (BP Location: Left Arm, Patient Position: Sitting, Cuff Size: Normal)   Pulse (!) 113   Resp 14   Ht 5\' 6"  (1.676 m)   Wt 157 lb 12.8 oz (71.6 kg)   BMI 25.47 kg/m    Physical Exam Vitals and nursing note reviewed.  Constitutional:      Appearance: She is well-developed.  HENT:     Head: Normocephalic and atraumatic.  Eyes:     Conjunctiva/sclera: Conjunctivae normal.  Cardiovascular:     Rate and Rhythm: Normal rate and regular rhythm.     Heart sounds: Normal heart sounds.  Pulmonary:     Effort: Pulmonary effort is normal.     Breath sounds: Normal breath sounds.  Abdominal:     General: Bowel sounds are normal.     Palpations: Abdomen is soft.  Musculoskeletal:     Cervical back: Normal range of motion.  Lymphadenopathy:     Cervical: No cervical adenopathy.  Skin:    General: Skin is warm and dry.     Capillary Refill: Capillary refill takes less than 2 seconds.  Neurological:     Mental Status: She is alert and oriented to person, place, and time.  Psychiatric:        Behavior: Behavior normal.      Musculoskeletal Exam: C-spine was in good range of motion.  She had lot of discomfort range of motion of the lumbar spine.  She had tenderness over lower lumbar region.  No SI joint  tenderness was noted.  Shoulder joints, elbow joints with good range of motion.  She has bilateral CMC prominence and tenderness.  She has thickening of bilateral DIP joints.  She has limited range of motion of her hip joints.  She had good range of motion of her knee joints without any warmth swelling or effusion.  She has tenderness across MTPs and PIPs but no synovitis was noted.  CDAI Exam: CDAI Score: -- Patient Global: --; Provider Global: -- Swollen: --; Tender: -- Joint Exam 07/29/2019   No joint exam has been documented for this visit   There is currently no information documented on the homunculus. Go to the Rheumatology activity and complete the homunculus joint exam.  Investigation: No additional findings.  Imaging: DG Lumbar Spine Complete  Result Date: 07/25/2019 CLINICAL DATA:  Acute lower back pain EXAM: LUMBAR SPINE - COMPLETE 4+ VIEW COMPARISON:  None. FINDINGS: There is no evidence of lumbar spine fracture. Alignment is normal. Mild disc height loss with facet arthrosis seen in the lower lumbar spine. Scattered vascular calcifications are noted. IMPRESSION: Negative. Electronically Signed   By: Prudencio Pair M.D.   On: 07/25/2019 05:38    Recent Labs: Lab Results  Component Value Date   WBC 11.1 (H) 04/25/2018   HGB 14.2 04/25/2018   PLT 416 (H) 04/25/2018   NA 137 04/25/2018   K 4.0 04/25/2018   CL 100 04/25/2018   CO2 27 04/25/2018   GLUCOSE 105 (H) 04/25/2018   BUN 21 04/25/2018   CREATININE 0.75 04/25/2018   BILITOT 0.5 04/25/2018   ALKPHOS 65 03/30/2017   AST 15 04/25/2018   ALT 13 04/25/2018   PROT 6.6 04/25/2018   ALBUMIN 4.2 03/30/2017   CALCIUM 9.6 04/25/2018   GFRAA 105 04/25/2018    Speciality Comments: No specialty comments available.  Procedures:  No procedures performed Allergies: Sulfur   Assessment / Plan:     Visit Diagnoses: Psoriatic arthritis ([facility])-patient has history of erosive psoriatic arthritis.  She had recurrent infections  on Enbrel and it was discontinued.  She had an adequate response to traditional DMARDs.  Patient wants to start on medication for psoriatic arthritis that she has ongoing discomfort in her joints.  We discussed different treatment options and their side effects.  To prepare her for future treatment I will obtain immunosuppressive labs today.  High risk medication use - D/c Enbrel in October 2019 due to recurrent infections. (methotrexate inadequate response).  History of TB and last chest x-ray showed no active disease on April 25, 2018 was normal.  Pain in both hands -she has been experiencing increased pain in her hands mostly over her CMC joints.  She has thickening of bilateral DIPs.  Plan: XR Hand 2 View Right, XR Hand 2 View Left.  Patient has erosive psoriatic arthritis with radiographic progression on the x-rays today.  Chronic pain of both knees -she complains of increased pain in her knee joints.  No warmth swelling or effusion was noted.  Plan: XR KNEE 3 VIEW RIGHT, XR KNEE 3 VIEW LEFT.  X-ray showed bilateral moderate osteoarthritis and moderate chondromalacia patella.  Pain in both feet -she complains of increased knee joint pain.  No synovitis was noted.  Plan: XR Foot 2 Views Right, XR Foot 2 Views Left.  X-rays were consistent with bilateral osteoarthritis.  DDD (degenerative disc disease), lumbar-she complains of severe lower back pain.  I reviewed x-rays from the emergency room which showed degenerative degenerative changes and facet joint arthropathy.  She also complains of lower extremity radiculopathy.  She has seen Dr. Lorin Mercy in the past.  We will request an appointment with Dr. Lorin Mercy.  ANA positive -  1:40 speckled: She has no other features of autoimmune disease at this time.   Positive QuantiFERON-TB Gold test - treated with Rifampin for 4 months  History of Meniere's disease  Smoker  Essential hypertension  History of ADHD  History of herpes simplex  infection  History of anxiety and depression    Orders: Orders Placed This Encounter  Procedures  . XR Hand 2 View Right  . XR Hand 2 View Left  . XR KNEE 3 VIEW RIGHT  . XR KNEE 3 VIEW LEFT  . XR Foot 2 Views Right  . XR Foot 2 Views Left  . DG Chest  2 View  . CBC with Differential/Platelet  . COMPLETE METABOLIC PANEL WITH GFR  . Sedimentation rate  . Hepatitis B core antibody, IgM  . Hepatitis B surface antigen  . Hepatitis C antibody  . HIV Antibody (routine testing w rflx)  . Serum protein electrophoresis with reflex  . IgG, IgA, IgM   No orders of the defined types were placed in this encounter.   Face-to-face time spent with patient was 40 minutes. Greater than 50% of time was spent in counseling and coordination of care.  Follow-Up Instructions: Return for Psoriatic arthritis, Osteoarthritis.   Bo Merino, MD  Note - This record has been created using Editor, commissioning.  Chart creation errors have been sought, but may not always  have been located. Such creation errors do not reflect on  the standard of medical care.

## 2019-07-30 NOTE — Progress Notes (Signed)
I will discuss results at the follow-up visit.

## 2019-07-31 ENCOUNTER — Ambulatory Visit: Payer: Self-pay

## 2019-07-31 ENCOUNTER — Telehealth: Payer: Self-pay

## 2019-07-31 ENCOUNTER — Ambulatory Visit (HOSPITAL_COMMUNITY)
Admission: RE | Admit: 2019-07-31 | Discharge: 2019-07-31 | Disposition: A | Discharge status: Home / Self Care | Payer: 59 | Source: Ambulatory Visit | Attending: Rheumatology | Admitting: Rheumatology

## 2019-07-31 ENCOUNTER — Encounter: Payer: Self-pay | Admitting: Surgery

## 2019-07-31 ENCOUNTER — Ambulatory Visit (INDEPENDENT_AMBULATORY_CARE_PROVIDER_SITE_OTHER): Payer: 59 | Admitting: Surgery

## 2019-07-31 VITALS — BP 140/103 | HR 113 | Ht 66.0 in | Wt 157.8 lb

## 2019-07-31 DIAGNOSIS — M5442 Lumbago with sciatica, left side: Secondary | ICD-10-CM

## 2019-07-31 DIAGNOSIS — Z79899 Other long term (current) drug therapy: Secondary | ICD-10-CM | POA: Diagnosis present

## 2019-07-31 DIAGNOSIS — M4317 Spondylolisthesis, lumbosacral region: Secondary | ICD-10-CM

## 2019-07-31 DIAGNOSIS — M5441 Lumbago with sciatica, right side: Secondary | ICD-10-CM

## 2019-07-31 LAB — CBC WITH DIFFERENTIAL/PLATELET
Absolute Monocytes: 1113 cells/uL — ABNORMAL HIGH (ref 200–950)
Basophils Absolute: 61 cells/uL (ref 0–200)
Basophils Relative: 0.5 %
Eosinophils Absolute: 73 cells/uL (ref 15–500)
Eosinophils Relative: 0.6 %
HCT: 46 % — ABNORMAL HIGH (ref 35.0–45.0)
Hemoglobin: 15.6 g/dL — ABNORMAL HIGH (ref 11.7–15.5)
Lymphs Abs: 3521 cells/uL (ref 850–3900)
MCH: 31.5 pg (ref 27.0–33.0)
MCHC: 33.9 g/dL (ref 32.0–36.0)
MCV: 92.9 fL (ref 80.0–100.0)
MPV: 10.7 fL (ref 7.5–12.5)
Monocytes Relative: 9.2 %
Neutro Abs: 7333 cells/uL (ref 1500–7800)
Neutrophils Relative %: 60.6 %
Platelets: 427 10*3/uL — ABNORMAL HIGH (ref 140–400)
RBC: 4.95 10*6/uL (ref 3.80–5.10)
RDW: 12.2 % (ref 11.0–15.0)
Total Lymphocyte: 29.1 %
WBC: 12.1 10*3/uL — ABNORMAL HIGH (ref 3.8–10.8)

## 2019-07-31 LAB — PROTEIN ELECTROPHORESIS, SERUM, WITH REFLEX
Albumin ELP: 4.3 g/dL (ref 3.8–4.8)
Alpha 1: 0.3 g/dL (ref 0.2–0.3)
Alpha 2: 0.7 g/dL (ref 0.5–0.9)
Beta 2: 0.5 g/dL (ref 0.2–0.5)
Beta Globulin: 0.5 g/dL (ref 0.4–0.6)
Gamma Globulin: 1.1 g/dL (ref 0.8–1.7)
Total Protein: 7.4 g/dL (ref 6.1–8.1)

## 2019-07-31 LAB — COMPLETE METABOLIC PANEL WITH GFR
AG Ratio: 1.5 (calc) (ref 1.0–2.5)
ALT: 12 U/L (ref 6–29)
AST: 15 U/L (ref 10–35)
Albumin: 4.4 g/dL (ref 3.6–5.1)
Alkaline phosphatase (APISO): 84 U/L (ref 37–153)
BUN: 15 mg/dL (ref 7–25)
CO2: 30 mmol/L (ref 20–32)
Calcium: 10 mg/dL (ref 8.6–10.4)
Chloride: 96 mmol/L — ABNORMAL LOW (ref 98–110)
Creat: 0.7 mg/dL (ref 0.50–1.05)
GFR, Est African American: 112 mL/min/{1.73_m2} (ref >=60)
GFR, Est Non African American: 97 mL/min/{1.73_m2} (ref >=60)
Globulin: 2.9 g/dL (calc) (ref 1.9–3.7)
Glucose, Bld: 98 mg/dL (ref 65–99)
Potassium: 3.9 mmol/L (ref 3.5–5.3)
Sodium: 137 mmol/L (ref 135–146)
Total Bilirubin: 0.4 mg/dL (ref 0.2–1.2)
Total Protein: 7.3 g/dL (ref 6.1–8.1)

## 2019-07-31 LAB — HIV ANTIBODY (ROUTINE TESTING W REFLEX): HIV 1&2 Ab, 4th Generation: NONREACTIVE

## 2019-07-31 LAB — IGG, IGA, IGM
IgG (Immunoglobin G), Serum: 1293 mg/dL (ref 600–1640)
IgM, Serum: 59 mg/dL (ref 50–300)
Immunoglobulin A: 402 mg/dL — ABNORMAL HIGH (ref 47–310)

## 2019-07-31 LAB — SEDIMENTATION RATE: Sed Rate: 6 mm/h (ref 0–30)

## 2019-07-31 LAB — HEPATITIS C ANTIBODY
Hepatitis C Ab: NONREACTIVE
SIGNAL TO CUT-OFF: 0.02 (ref <1.00)

## 2019-07-31 LAB — HEPATITIS B CORE ANTIBODY, IGM: Hep B C IgM: NONREACTIVE

## 2019-07-31 LAB — HEPATITIS B SURFACE ANTIGEN: Hepatitis B Surface Ag: NONREACTIVE

## 2019-07-31 MED ORDER — METHYLPREDNISOLONE 4 MG PO TABS
ORAL_TABLET | ORAL | 0 refills | Status: DC
Start: 2019-07-31 — End: 2019-08-26

## 2019-07-31 NOTE — Telephone Encounter (Signed)
Per Benjiman Core, PA patient was given IM injections. Patient given Toradol 30mg  (1cc) in right gluteal and Depo 80mg  (1cc) in left gluteal.  Patient did well with each gluteal injection.

## 2019-08-05 NOTE — Progress Notes (Signed)
Office Visit Note   Patient: Valerie Delgado           Date of Birth: 10-31-1965           MRN: [id number] Visit Date: 07/31/2019              Requested by: Glenis Smoker, MD [place],  [place] [address] PCP: Glenis Smoker, MD   Assessment & Plan: Visit Diagnoses:  1. Acute bilateral low back pain with bilateral sciatica   2. Spondylolisthesis of lumbosacral region     Plan: since patient has multiple modes of conservative treatment only option at this point is to get lumbar MRI.  F/u with Dr Lorin Mercy after completion to discuss results and treatment options.   Follow-Up Instructions: Return in about 3 weeks (around 08/21/2019) for WITH DR [name] TO REVIEW LUMBAR MRI..   Orders:  Orders Placed This Encounter  Procedures  . XR Lumbar Spine 2-3 Views  . MR LUMBAR SPINE WO CONTRAST   Meds ordered this encounter  Medications  . methylPREDNISolone (MEDROL) 4 MG tablet    Sig: 6 DAY TAPER TO BE TAKEN AS DIRECTED.    Dispense:  21 tablet    Refill:  0      Procedures: No procedures performed   Clinical Data: No additional findings.   Subjective: Chief Complaint  Patient presents with  . Lower Back - Follow-up    HPI 54 yo female comes in with worsening LBP and right greater than left LE radiculopathy.  Has known hx of lumbar spondylosis and was last seen by dr Lorin Mercy for back pain 2018.  She has tried to manage this conservatively.  Has gone to formal PT.  Was seen in ED July 25, 2019 for back pain and was prescribed muscle relaxer.  States that pain radiates down to both lower legs with numbness and tingling.  Denies bowel or bladder incontinence.  Muscle laxer is not helping.  Patient has a history of psoriatic arthritis and is followed by Dr. Estanislado Pandy rheumatologist for this.     Review of Systems No current cardiac, pulm, gi, gu issues.   Objective: Vital Signs: BP (!) 140/103   Pulse (!) 113   Ht 5\' 6"  (1.676 m)   Wt 157 lb 12.8  oz (71.6 kg)   BMI 25.47 kg/m   Physical Exam HENT:     Head: Normocephalic and atraumatic.     Mouth/Throat:     Mouth: Mucous membranes are moist.  Eyes:     Extraocular Movements: Extraocular movements intact.     Pupils: Pupils are equal, round, and reactive to light.  Pulmonary:     Effort: Pulmonary effort is normal. No respiratory distress.  Musculoskeletal:     Comments: Positive lumbar paraspinal tenderness.  Gait antalgic.  Pain with right straight leg raise.NVI.  No focal motor deficits. bilat calves nontender.   Skin:    General: Skin is warm and dry.  Neurological:     General: No focal deficit present.     Mental Status: She is alert and oriented to person, place, and time.  Psychiatric:        Mood and Affect: Mood normal.     Ortho Exam  Specialty Comments:  No specialty comments available.  Imaging: No results found.   PMFS History: Patient Active Problem List   Diagnosis Date Noted  . Decreased visual acuity 03/30/2017  . Estrogen deficiency 03/30/2017  . Inflammatory arthritis 11/13/2016  .  Essential hypertension 09/19/2016  . History of anxiety and depression 09/19/2016  . History of Meniere's disease 09/19/2016  . History of hearing loss 09/19/2016  . History of ADHD 09/19/2016  . Dyspareunia, female 01/11/2016  . Routine general medical examination at a health care facility 01/11/2016  . Smoker 09/01/2015  . Anxiety and depression 09/01/2015  . Positive QuantiFERON-TB Gold test 09/01/2015  . History of herpes simplex infection 09/01/2015   Past Medical History:  Diagnosis Date  . Allergy   . Anxiety   . Arthritis   . Depression   . Hypertension   . Meniere's disease of left ear   . Psoriatic arthritis ([place])    Dr. Estanislado Pandy  . Tuberculosis    started treatment on 08/30/2015    Family History  Problem Relation Age of Onset  . Hypertension Mother   . Cancer Mother        breast  . Arthritis Mother   . Depression Mother   .  Hypertension Father   . Hypertension Brother   . Stroke Brother   . Colon cancer Neg Hx   . Esophageal cancer Neg Hx   . Rectal cancer Neg Hx   . Stomach cancer Neg Hx     Past Surgical History:  Procedure Laterality Date  . CESAREAN SECTION    . MOUTH SURGERY  2020   tooth extraction/ bone graft    Social History   Occupational History  . Not on file  Tobacco Use  . Smoking status: Current Every Day Smoker    Years: 30.00  . Smokeless tobacco: Never Used  Vaping Use  . Vaping Use: Never used  Substance and Sexual Activity  . Alcohol use: Yes    Comment: occ  . Drug use: No  . Sexual activity: Not on file    Comment: lives with her mother. 1 child age 37.

## 2019-08-18 NOTE — Progress Notes (Signed)
Office Visit Note  Patient: Valerie Delgado             Date of Birth: 20-Dec-1965           MRN: [id number]             PCP: Glenis Smoker, MD Referring: Glenis Smoker, * Visit Date: 08/26/2019 Occupation: @GUAROCC @  Subjective:  Pain in both hands  History of Present Illness: Zettie Gootee is a 54 y.o. female with history of psoriatic arthritis and osteoarthritis.  Patient presents today to discuss treatment options.  She continues to have chronic pain and intermittent inflammation in both hands.  Her discomfort is most severe in bilateral CMC joints.  She states that she is also having severe lower back pain and is scheduled for an MRI on 09/01/2019.  She has ongoing symptoms of radiculopathy to both lower extremities.  She has occasional patches of psoriasis on her scalp.  She denies any Achilles tendinitis or plantar fasciitis.  Activities of Daily Living:  Patient reports morning stiffness for 45-60 minutes.   Patient Reports nocturnal pain.  Difficulty dressing/grooming: Reports Difficulty climbing stairs: Reports Difficulty getting out of chair: Reports Difficulty using hands for taps, buttons, cutlery, and/or writing: Reports  Review of Systems  Constitutional: Positive for fatigue.  HENT: Positive for mouth dryness. Negative for mouth sores and nose dryness.   Eyes: Negative for pain, itching, visual disturbance and dryness.  Respiratory: Negative for cough, hemoptysis, shortness of breath and difficulty breathing.   Cardiovascular: Negative for chest pain, palpitations, hypertension and swelling in legs/feet.  Gastrointestinal: Negative for blood in stool, constipation and diarrhea.  Endocrine: Negative for increased urination.  Genitourinary: Negative for difficulty urinating and painful urination.  Musculoskeletal: Positive for arthralgias, joint pain, joint swelling and morning stiffness. Negative for myalgias, muscle weakness, muscle tenderness and myalgias.    Skin: Negative for color change, pallor, rash, hair loss, nodules/bumps, redness, skin tightness, ulcers and sensitivity to sunlight.  Allergic/Immunologic: Negative for susceptible to infections.  Neurological: Negative for dizziness, headaches and memory loss.  Hematological: Negative for bruising/bleeding tendency and swollen glands.  Psychiatric/Behavioral: Negative for depressed mood, confusion and sleep disturbance. The patient is not nervous/anxious.     PMFS History:  Patient Active Problem List   Diagnosis Date Noted  . Decreased visual acuity 03/30/2017  . Estrogen deficiency 03/30/2017  . Inflammatory arthritis 11/13/2016  . Essential hypertension 09/19/2016  . History of anxiety and depression 09/19/2016  . History of Meniere's disease 09/19/2016  . History of hearing loss 09/19/2016  . History of ADHD 09/19/2016  . Dyspareunia, female 01/11/2016  . Routine general medical examination at a health care facility 01/11/2016  . Smoker 09/01/2015  . Anxiety and depression 09/01/2015  . Positive QuantiFERON-TB Gold test 09/01/2015  . History of herpes simplex infection 09/01/2015    Past Medical History:  Diagnosis Date  . Allergy   . Anxiety   . Arthritis   . Depression   . Hypertension   . Meniere's disease of left ear   . Psoriatic arthritis ([place])    Dr. Estanislado Pandy  . Tuberculosis    started treatment on 08/30/2015    Family History  Problem Relation Age of Onset  . Hypertension Mother   . Cancer Mother        breast  . Arthritis Mother   . Depression Mother   . Hypertension Father   . Hypertension Brother   . Stroke Brother   . Colon cancer Neg  Hx   . Esophageal cancer Neg Hx   . Rectal cancer Neg Hx   . Stomach cancer Neg Hx    Past Surgical History:  Procedure Laterality Date  . CESAREAN SECTION    . MOUTH SURGERY  2020   tooth extraction/ bone graft    Social History   Social History Narrative  . Not on file   Immunization History   Administered Date(s) Administered  . Influenza,inj,Quad PF,6+ Mos 01/11/2016, 03/30/2017  . Influenza-Unspecified 01/23/2018  . Tdap 08/21/2014     Objective: Vital Signs: BP (!) 134/96 (BP Location: Left Arm, Patient Position: Sitting, Cuff Size: Normal)   Pulse 96   Resp 15   Ht 5\' 6"  (1.676 m)   Wt 159 lb 6.4 oz (72.3 kg)   BMI 25.73 kg/m    Physical Exam Vitals and nursing note reviewed.  Constitutional:      Appearance: She is well-developed.  HENT:     Head: Normocephalic and atraumatic.  Eyes:     Conjunctiva/sclera: Conjunctivae normal.  Pulmonary:     Effort: Pulmonary effort is normal.  Abdominal:     General: Bowel sounds are normal.     Palpations: Abdomen is soft.  Musculoskeletal:     Cervical back: Normal range of motion.  Lymphadenopathy:     Cervical: No cervical adenopathy.  Skin:    General: Skin is warm and dry.     Capillary Refill: Capillary refill takes less than 2 seconds.  Neurological:     Mental Status: She is alert and oriented to person, place, and time.  Psychiatric:        Behavior: Behavior normal.      Musculoskeletal Exam: C-spine good ROM.  Lumbar spine painful ROM. Midline spinal tenderness in lumbar region. Shoulder joints, elbow joints, wrist joints, MCPs, PIPs, and DIPs good ROM with no synovitis. DIP thickening consistent with osteoarthritis of both hands. [place] joint prominence bilaterally.  Hip joints have limited ROM with discomfort. Knee joints and ankle joints good ROM with no warmth or effusion.  No achilles tendonitis or achilles tendonitis.  Tenderness of MTPs and PIPs.  CDAI Exam: CDAI Score: -- Patient Global: --; Provider Global: -- Swollen: --; Tender: -- Joint Exam 08/26/2019   No joint exam has been documented for this visit   There is currently no information documented on the homunculus. Go to the Rheumatology activity and complete the homunculus joint exam.  Investigation: No additional  findings.  Imaging: DG Chest 2 View  Result Date: 08/02/2019 CLINICAL DATA:  Immunosuppressive therapy.  History of TB EXAM: CHEST - 2 VIEW COMPARISON:  04/25/2018 FINDINGS: The heart size and mediastinal contours are within normal limits. Mild atherosclerotic calcification of the aortic knob. No focal airspace consolidation, pleural effusion, or pneumothorax. Slight thoracic scoliotic curvature with a kyphosis of the lower thoracic spine, similar to prior. IMPRESSION: No active cardiopulmonary disease. No evidence of active TB infection. Electronically Signed   By: Davina Poke D.O.   On: 08/02/2019 11:47   XR Foot 2 Views Left  Result Date: 07/29/2019 First MTP, PIP and DIP narrowing was noted.  No erosive changes were noted.  No intertarsal or tibiotalar joint space narrowing was noted.  Calcaneal spur was noted. Impression: These findings are consistent with osteoarthritis of the foot.  XR Foot 2 Views Right  Result Date: 07/29/2019 First MTP, PIP and DIP narrowing was noted.  No erosive changes were noted.  No intertarsal or tibiotalar joint space narrowing was noted.  Impression: These findings are consistent with osteoarthritis of the foot.  XR Hand 2 View Left  Result Date: 07/29/2019 Narrowing of all PIP and DIP joints was noted.  Erosive changes were noted in the first and second DIPs.  Radiographic progression was noted in the second DIP joint.  CMC narrowing was noted.  No intercarpal radiocarpal joint space narrowing was noted.  Second and third MCP joint narrowing was noted. Impression: These findings are consistent with erosive psoriatic arthritis and osteoarthritis.  Radiographic progression was noted when compared to the films of September 20, 2016.  XR Hand 2 View Right  Result Date: 07/29/2019 Second and third MCP joint narrowing was noted.  Narrowing of all PIP joints was noted.  Erosive changes were noted at all DIP joints with no radiographic progression when compared to the  x-rays of September 20, 2016.  No intercarpal radiocarpal joint space narrowing was noted. Impression: These findings are consistent with psoriatic arthritis and osteoarthritis overlap.  She has had no radiographic progression when compared to the x-rays of September 20, 2016.  XR KNEE 3 VIEW LEFT  Result Date: 07/29/2019 Moderate osteoarthritis and moderate chondromalacia patella was noted.  No chondrocalcinosis was noted. Impression: These findings are consistent moderate osteoarthritis and moderate chondromalacia patella.  XR KNEE 3 VIEW RIGHT  Result Date: 07/29/2019 Moderate medial compartment narrowing was noted.  Moderate patellofemoral narrowing was noted.  No chondrocalcinosis was noted. Impression: These findings are consistent with moderate osteoarthritis and moderate chondromalacia patella.   Recent Labs: Lab Results  Component Value Date   WBC 12.1 (H) 07/29/2019   HGB 15.6 (H) 07/29/2019   PLT 427 (H) 07/29/2019   NA 137 07/29/2019   K 3.9 07/29/2019   CL 96 (L) 07/29/2019   CO2 30 07/29/2019   GLUCOSE 98 07/29/2019   BUN 15 07/29/2019   CREATININE 0.70 07/29/2019   BILITOT 0.4 07/29/2019   ALKPHOS 65 03/30/2017   AST 15 07/29/2019   ALT 12 07/29/2019   PROT 7.3 07/29/2019   PROT 7.4 07/29/2019   ALBUMIN 4.2 03/30/2017   CALCIUM 10.0 07/29/2019   GFRAA 112 07/29/2019    Speciality Comments: No specialty comments available.  Procedures:  No procedures performed Allergies: Sulfur   Assessment / Plan:     Visit Diagnoses: Psoriatic arthritis ([place]) - History of erosive psoriatic arthritis with radiographic progression while off of DMARD/biologic therapy.  She had recurrent infections on Enbrel and it was discontinued in the past.  She continues to have chronic pain and stiffness in both hands and both wrists.  She has DIP thickening and CMC joint prominence bilaterally.  She has intermittent swelling in both hands and both wrist joints.  X-rays of the hands, knees, and feet  were updated on 07/29/2019.  There was no radiographic progression in her left hand when compared to x-rays from 09/20/2016.  We discussed different treatment options at length.  Indications, contraindications, potential side effects of Cosentyx were discussed today.  We will apply for Cosentyx 300 mg obvious injections once monthly.  All questions were addressed and consent was obtained.  She will return for lab work in 1 month and every 3 months to monitor for drug toxicity.  Standing orders are in place.  She will follow-up in the office in 6 weeks to assess her response.  Medication counseling: TB Gold: Chest x-ray on 08/02/2019 did not reveal any active cardiopulmonary disease or an active TB infection.  Hepatitis panel: Negative on 07/29/2019 HIV: Negative on 07/29/2019  SPEP: Within normal limits on 07/29/2019 Immunoglobulin: IgA borderline elevated rest of immunoglobulins were within normal limits on 07/29/2019.  Does patient have a history of inflammatory bowel disease? No Normal colonoscopy on 09/27/2017.  Repeat in 10 years.  Counseled patient that Cosentyx is a IL-17 inhibitor that works to reduce pain and inflammation associated with arthritis.  Counseled patient on purpose, proper use, and adverse effects of Cosentyx. Reviewed the most common adverse effects of infection, inflammatory bowel disease, and allergic reaction.  Reviewed the importance of regular labs while on Cosentyx.  Counseled patient that Cosentyx should be held prior to scheduled surgery.  Counseled patient to avoid live vaccines while on Cosentyx.  Advised patient to get annual influenza vaccine and the pneumococcal vaccine as indicated.  Provided patient with medication education material and answered all questions.  Patient consented to Cosentyx.  Will upload consent into patient's chart.  Will apply for Cosentyx through patient's insurance.  Reviewed storage information for Cosentyx.  Advised initial injection must be administered in  office.  Patient voiced understanding.    High risk medication use - Discussed starting her on Cosentyx 300 mg sq injections once monthly.  Baseline immunosuppressive labs were obtained on 07/29/2019.  CBC and CMP were updated at that time.  Will return for lab work in 1 month and every 3 months to monitor for drug toxicity.  Standing orders for CBC and CMP are in place.   D/c Enbrel in October 2019 due to recurrent infections. (methotrexate inadequate response).  Pain in both hands - Patient has erosive psoriatic arthritis with radiographic progression on the x-rays.  CMC joint prominence noted bilaterally.  She has DIP thickening.  She is able to make a complete fist bilaterally.  She has been experiencing increased pain and stiffness in both hands and both wrist joints which has made it difficult to complete some of her ADLs.  Joint protection and muscle strengthening were discussed.  Primary osteoarthritis of both knees - X-ray showed bilateral moderate osteoarthritis and moderate chondromalacia patella.  She has good range of motion of both knee joints on exam.  No warmth or effusion was noted.  Primary osteoarthritis of both feet - X-rays were consistent with bilateral osteoarthritis.  She has tenderness of PIP joints but no inflammation was noted.  She wears proper fitting shoes.  DDD (degenerative disc disease), lumbar - Dr. Lorin Mercy.  She has been experiencing chronic severe lower back pain.  She has had symptoms of radiculopathy bilaterally.  She has failed conservative treatment measures.  An MRI of the lumbar spine is scheduled for 09/01/2019.  ANA positive - 1:40 speckled: She has no other features of autoimmune disease at this time.   Positive QuantiFERON-TB Gold test - Treated with Rifampin for 4 months.  Chest x-ray obtained on 07/31/2019 did not reveal any active cardiopulmonary disease.  No evidence of active TB infection noted.  Other medical conditions are listed as follows:  History  of Meniere's disease  History of ADHD  Essential hypertension  History of herpes simplex infection  History of anxiety and depression  Smoker  Orders: No orders of the defined types were placed in this encounter.  No orders of the defined types were placed in this encounter.   Face-to-face time spent with patient was 30 minutes. Greater than 50% of time was spent in counseling and coordination of care.  Follow-Up Instructions: Return in 6 weeks (on 10/07/2019) for Psoriatic arthritis, Osteoarthritis.   Ofilia Neas, PA-C  I examined  and evaluated the patient with Hazel Sams PA.  Patient continues to have ongoing pain and discomfort.  She had synovial thickening of her DIP joints om my exam.  We will be initiating Cosentyx.  Indications side effects contraindications were discussed at length today.  We will apply for Cosentyx.  The plan of care was discussed as noted above.  Bo Merino, MD  Note - This record has been created using Editor, commissioning.  Chart creation errors have been sought, but may not always  have been located. Such creation errors do not reflect on  the standard of medical care.

## 2019-08-26 ENCOUNTER — Telehealth: Payer: Self-pay | Admitting: Pharmacist

## 2019-08-26 ENCOUNTER — Encounter: Payer: Self-pay | Admitting: Rheumatology

## 2019-08-26 ENCOUNTER — Other Ambulatory Visit: Payer: Self-pay

## 2019-08-26 ENCOUNTER — Ambulatory Visit (INDEPENDENT_AMBULATORY_CARE_PROVIDER_SITE_OTHER): Payer: 59 | Admitting: Rheumatology

## 2019-08-26 VITALS — BP 134/96 | HR 96 | Resp 15 | Ht 66.0 in | Wt 159.4 lb

## 2019-08-26 DIAGNOSIS — M79641 Pain in right hand: Secondary | ICD-10-CM

## 2019-08-26 DIAGNOSIS — L4050 Arthropathic psoriasis, unspecified: Secondary | ICD-10-CM | POA: Diagnosis not present

## 2019-08-26 DIAGNOSIS — M170 Bilateral primary osteoarthritis of knee: Secondary | ICD-10-CM | POA: Diagnosis not present

## 2019-08-26 DIAGNOSIS — F17200 Nicotine dependence, unspecified, uncomplicated: Secondary | ICD-10-CM

## 2019-08-26 DIAGNOSIS — Z79899 Other long term (current) drug therapy: Secondary | ICD-10-CM

## 2019-08-26 DIAGNOSIS — R7612 Nonspecific reaction to cell mediated immunity measurement of gamma interferon antigen response without active tuberculosis: Secondary | ICD-10-CM

## 2019-08-26 DIAGNOSIS — M19072 Primary osteoarthritis, left ankle and foot: Secondary | ICD-10-CM

## 2019-08-26 DIAGNOSIS — R768 Other specified abnormal immunological findings in serum: Secondary | ICD-10-CM

## 2019-08-26 DIAGNOSIS — I10 Essential (primary) hypertension: Secondary | ICD-10-CM

## 2019-08-26 DIAGNOSIS — Z8619 Personal history of other infectious and parasitic diseases: Secondary | ICD-10-CM

## 2019-08-26 DIAGNOSIS — Z8669 Personal history of other diseases of the nervous system and sense organs: Secondary | ICD-10-CM

## 2019-08-26 DIAGNOSIS — M79642 Pain in left hand: Secondary | ICD-10-CM

## 2019-08-26 DIAGNOSIS — M5136 Other intervertebral disc degeneration, lumbar region: Secondary | ICD-10-CM

## 2019-08-26 DIAGNOSIS — Z8659 Personal history of other mental and behavioral disorders: Secondary | ICD-10-CM

## 2019-08-26 DIAGNOSIS — M19071 Primary osteoarthritis, right ankle and foot: Secondary | ICD-10-CM

## 2019-08-26 NOTE — Telephone Encounter (Signed)
Submitted a Prior Authorization request to [facility] for [place] via Cover My Meds. Will update once we receive a response.   Key # [id number]  Mariella Saa, PharmD, BCACP, CPP Clinical Specialty Pharmacist (Rheumatology and Pulmonology)  08/26/2019 1:56 PM

## 2019-08-26 NOTE — Patient Instructions (Signed)
Secukinumab injection What is this medicine? SECUKINUMAB (sek ue KIN ue mab) is used to treat psoriasis. It is also used to treat psoriatic arthritis, ankylosing spondylitis, and active non-radiographic axial spondyloarthritis. This medicine may be used for other purposes; ask your health care provider or pharmacist if you have questions. COMMON BRAND NAME(S): Cosentyx What should I tell my health care provider before I take this medicine? They need to know if you have any of these conditions:  Crohn's disease, ulcerative colitis, or other inflammatory bowel disease  immune system problems  infection or history of infection (especially a viral infection such as chickenpox, cold sores, or herpes)  recently received or are scheduled to receive a vaccine  tuberculosis, a positive skin test for tuberculosis, or have recently been in close contact with someone who has tuberculosis  an unusual or allergic reaction to secukinumab, other medicines, latex, rubber, foods, dyes, or preservatives  pregnant or trying to get pregnant  breast-feeding How should I use this medicine? This medicine is for injection under the skin. It may be administered by a healthcare professional in a hospital or clinic setting or at home. If you get this medicine at home, you will be taught how to prepare and give this medicine. Use exactly as directed. Take your medicine at regular intervals. Do not take your medicine more often than directed. It is important that you put your used needles and syringes in a special sharps container. Do not put them in a trash can. If you do not have a sharps container, call your pharmacist or healthcare provider to get one. A special MedGuide will be given to you by the pharmacist with each prescription and refill. Be sure to read this information carefully each time. Talk to your pediatrician regarding the use of this medicine in children. Special care may be needed. Overdosage: If  you think you have taken too much of this medicine contact a poison control center or emergency room at once. NOTE: This medicine is only for you. Do not share this medicine with others. What if I miss a dose? It is important not to miss your dose. Call your doctor of health care professional if you are unable to keep an appointment. If you give yourself the medicine and you miss a dose, take it as soon as you can. If it is almost time for your next dose, take only that dose. Do not take double or extra doses. What may interact with this medicine? Do not take this medicine with any of the following medications:  live virus vaccines This medicine may also interact with the following medications:  cyclosporine  inactivated vaccines  warfarin This list may not describe all possible interactions. Give your health care provider a list of all the medicines, herbs, non-prescription drugs, or dietary supplements you use. Also tell them if you smoke, drink alcohol, or use illegal drugs. Some items may interact with your medicine. What should I watch for while using this medicine? Tell your doctor or healthcare professional if your symptoms do not start to get better or if they get worse. You will be tested for tuberculosis (TB) before you start this medicine. If your doctor prescribes any medicine for TB, you should start taking the TB medicine before starting this medicine. Make sure to finish the full course of TB medicine. This medicine may increase your risk of getting an infection. Call your doctor or health care professional for advice if you get a fever, chills or sore  throat, or other symptoms of a cold or flu. Do not treat yourself. Try to avoid being around people who are sick. This medicine can decrease the response to a vaccine. If you need to get vaccinated, tell your healthcare professional if you have received this medicine within the last 6 months. Extra booster doses may be needed. Talk  to your doctor to see if a different vaccination schedule is needed. What side effects may I notice from receiving this medicine? Side effects that you should report to your doctor or health care professional as soon as possible:  allergic reactions like skin rash, itching or hives, swelling of the face, lips, or tongue  breathing problems  feeling faint or lightheaded  signs and symptoms of bowel problems like abdominal pain, diarrhea, blood in the stool, and weight loss  signs and symptoms of infection like fever or chills; cough; sore throat; pain or trouble passing urine Side effects that usually do not require medical attention (report to your doctor or health care professional if they continue or are bothersome):  pain, redness, or irritation at site where injected  stuffy or runny nose This list may not describe all possible side effects. Call your doctor for medical advice about side effects. You may report side effects to FDA at 1-800-FDA-1088. Where should I keep my medicine? Keep out of the reach of children. Store the prefilled syringe or injection pen in a refrigerator between 2 to 8 degrees C (36 to 46 degrees F). Keep the syringe or the pen in the original carton until ready for use. Protect from light. Do not freeze. Do not shake. Prior to use, remove the syringe or pen from the refrigerator and use within 1 hour. Throw away any unused medicine after the expiration date on the label. NOTE: This sheet is a summary. It may not cover all possible information. If you have questions about this medicine, talk to your doctor, pharmacist, or health care provider.  2020 Elsevier/Gold Standard (2018-08-08 19:12:14)

## 2019-08-26 NOTE — Progress Notes (Signed)
Pharmacy Note  Subjective:  Patient presents today to [facility] for follow up office visit.   Patient was seen by the pharmacist for counseling on Cosentyx for psoriatic arthritis and history of plaque psoriasis.    Objective:  CBC    Component Value Date/Time   WBC 12.1 (H) 07/29/2019 1046   RBC 4.95 07/29/2019 1046   HGB 15.6 (H) 07/29/2019 1046   HGB 13.5 03/30/2017 0931   HCT 46.0 (H) 07/29/2019 1046   HCT 40.3 03/30/2017 0931   PLT 427 (H) 07/29/2019 1046   PLT 351 03/30/2017 0931   MCV 92.9 07/29/2019 1046   MCV 94 03/30/2017 0931   MCH 31.5 07/29/2019 1046   MCHC 33.9 07/29/2019 1046   RDW 12.2 07/29/2019 1046   RDW 13.4 03/30/2017 0931   LYMPHSABS 3,521 07/29/2019 1046   LYMPHSABS 2.5 03/30/2017 0931   MONOABS 707 10/17/2016 1046   EOSABS 73 07/29/2019 1046   EOSABS 0.2 03/30/2017 0931   BASOSABS 61 07/29/2019 1046   BASOSABS 0.1 03/30/2017 0931    CMP     Component Value Date/Time   NA 137 07/29/2019 1046   NA 143 03/30/2017 0931   K 3.9 07/29/2019 1046   CL 96 (L) 07/29/2019 1046   CO2 30 07/29/2019 1046   GLUCOSE 98 07/29/2019 1046   BUN 15 07/29/2019 1046   BUN 14 03/30/2017 0931   CREATININE 0.70 07/29/2019 1046   CALCIUM 10.0 07/29/2019 1046   PROT 7.3 07/29/2019 1046   PROT 7.4 07/29/2019 1046   PROT 6.7 03/30/2017 0931   ALBUMIN 4.2 03/30/2017 0931   AST 15 07/29/2019 1046   ALT 12 07/29/2019 1046   ALKPHOS 65 03/30/2017 0931   BILITOT 0.4 07/29/2019 1046   BILITOT 0.3 03/30/2017 0931   GFRNONAA 97 07/29/2019 1046   GFRAA 112 07/29/2019 1046    Baseline Immunosuppressant Therapy Labs TB GOLD History of TB and treated with rifampin in the past.  Monitor chest x-ray yearly. Hepatitis Panel Hepatitis Latest Ref Rng & Units 07/29/2019  Hep B Surface Ag NON-REACTI NON-REACTIVE  Hep B IgM NON-REACTI NON-REACTIVE  Hep C Ab NON-REACTI NON-REACTIVE  Hep C Ab NON-REACTI NON-REACTIVE   HIV Lab Results  Component Value Date   HIV  NON-REACTIVE 07/29/2019   Immunoglobulins Immunoglobulin Electrophoresis Latest Ref Rng & Units 07/29/2019  IgA  47 - 310 mg/dL 402(H)  IgG 600 - 1,640 mg/dL 1,293  IgM 50 - 300 mg/dL 59   SPEP Serum Protein Electrophoresis Latest Ref Rng & Units 07/29/2019  Total Protein 6.1 - 8.1 g/dL 7.4  Albumin 3.8 - 4.8 g/dL 4.3  Alpha-1 0.2 - 0.3 g/dL 0.3  Alpha-2 0.5 - 0.9 g/dL 0.7  Beta Globulin 0.4 - 0.6 g/dL 0.5  Beta 2 0.2 - 0.5 g/dL 0.5  Gamma Globulin 0.8 - 1.7 g/dL 1.1   G6PD No results found for: G6PDH TPMT No results found for: TPMT   Chest x-ray: No active cardiopulmonary disease. No evidence of active TB infection.  Does patient have a history of inflammatory bowel disease? No  Assessment/Plan:  Counseled patient that Cosentyx is a IL-17 inhibitor that works to reduce pain and inflammation associated with arthritis.  Counseled patient on purpose, proper use, and adverse effects of Cosentyx.  Reviewed the most common adverse effects of infection, inflammatory bowel disease, and allergic reaction.  Counseled patient that Cosentyx should be held prior to scheduled surgery.  Counseled patient to avoid live vaccines while on Cosentyx.  Recommend annual  influenza, Pneumovax 23, Prevnar 13, and Shingrix as indicated.   Reviewed the importance of regular labs while on Cosentyx. Standing orders placed. Provided patient with medication education material and answered all questions.  Patient consented to Cosentyx.  Will upload consent into patient's chart.  Will apply for Cosentyx through patient's insurance.  Reviewed storage information for Cosentyx.  Advised initial injection must be administered in office.  Patient voiced understanding.    Patient dose will be for plaque psoriasis +/- psoriatic arthritis 300 mg every 7 days for 5 weeks then 300 mg every 28 days.  Prescription pending labs and insurance approval.  Mariella Saa, PharmD, BCACP, CPP Clinical Specialty Pharmacist (Rheumatology  and Pulmonology)  08/26/2019 9:16 AM

## 2019-08-26 NOTE — Telephone Encounter (Signed)
Start benefits investigation for Cosentyx for psoriatic arthritis and hisotry of plaque psoriasis.  She has tried and failed methotrexate and Enbrel.   Mariella Saa, PharmD, [place], CPP Clinical Specialty Pharmacist (Rheumatology and Pulmonology)  08/26/2019 9:20 AM

## 2019-09-01 ENCOUNTER — Encounter: Payer: Self-pay | Admitting: Rheumatology

## 2019-09-01 ENCOUNTER — Ambulatory Visit
Admission: RE | Admit: 2019-09-01 | Discharge: 2019-09-01 | Disposition: A | Discharge status: Home / Self Care | Payer: 59 | Source: Ambulatory Visit | Attending: Surgery | Admitting: Surgery

## 2019-09-01 ENCOUNTER — Other Ambulatory Visit: Payer: Self-pay

## 2019-09-01 DIAGNOSIS — M4317 Spondylolisthesis, lumbosacral region: Secondary | ICD-10-CM

## 2019-09-01 NOTE — Telephone Encounter (Signed)
Insurance will not approve Cosentyx unless the patient has tried and failed Enbrel, Humira, Stelara or Otezla.  She has tried Enbrel and documented that discontinued due to recurrent infection.  No synovitis on exam at last visit.  Valerie Delgado and I discussed Rutherford Nail as an option.  Should we bring her back to discuss MRI results and medication options?

## 2019-09-01 NOTE — Progress Notes (Signed)
Office Visit Note  Patient: Valerie Delgado             Date of Birth: Feb 26, 1965           MRN: [id number]             PCP: Glenis Smoker, MD Referring: Glenis Smoker, * Visit Date: 09/08/2019 Occupation: @GUAROCC @  Subjective:  Pain in multiple joints.   History of Present Illness: Maevyn Riordan is a 54 y.o. female with history of psoriatic arthritis, osteoarthritis and degenerative disc disease.  She states she has been seeing Dr. Lorin Mercy for lower back pain.  She had recent MRI which showed degenerative changes and facet joint arthropathy.  She continues to have pain and discomfort in her hands and feet.  She has occasional psoriasis lesions in her scalp.  She has been having swelling in her hands.  Activities of Daily Living:  Patient reports morning stiffness for 1 hour.   Patient Reports nocturnal pain.  Difficulty dressing/grooming: Denies Difficulty climbing stairs: Reports Difficulty getting out of chair: Reports Difficulty using hands for taps, buttons, cutlery, and/or writing: Reports  Review of Systems  Constitutional: Positive for fatigue.  HENT: Positive for mouth dryness. Negative for mouth sores and nose dryness.   Eyes: Negative for itching and dryness.  Respiratory: Negative for shortness of breath and difficulty breathing.   Cardiovascular: Negative for chest pain and palpitations.  Gastrointestinal: Negative for blood in stool, constipation and diarrhea.  Endocrine: Negative for increased urination.  Genitourinary: Negative for difficulty urinating.  Musculoskeletal: Positive for arthralgias, joint pain, joint swelling, myalgias, morning stiffness, muscle tenderness and myalgias.  Skin: Negative for color change, rash and redness.  Allergic/Immunologic: Negative for susceptible to infections.  Neurological: Negative for dizziness, numbness, headaches, memory loss and weakness.  Hematological: Negative for bruising/bleeding tendency.    Psychiatric/Behavioral: Negative for confusion.    PMFS History:  Patient Active Problem List   Diagnosis Date Noted  . Decreased visual acuity 03/30/2017  . Estrogen deficiency 03/30/2017  . Inflammatory arthritis 11/13/2016  . Essential hypertension 09/19/2016  . History of anxiety and depression 09/19/2016  . History of Meniere's disease 09/19/2016  . History of hearing loss 09/19/2016  . History of ADHD 09/19/2016  . Dyspareunia, female 01/11/2016  . Routine general medical examination at a health care facility 01/11/2016  . Smoker 09/01/2015  . Anxiety and depression 09/01/2015  . Positive QuantiFERON-TB Gold test 09/01/2015  . History of herpes simplex infection 09/01/2015    Past Medical History:  Diagnosis Date  . Allergy   . Anxiety   . Arthritis   . Depression   . Hypertension   . Meniere's disease of left ear   . Psoriatic arthritis ([place])    Dr. Estanislado Pandy  . Tuberculosis    started treatment on 08/30/2015    Family History  Problem Relation Age of Onset  . Hypertension Mother   . Cancer Mother        breast  . Arthritis Mother   . Depression Mother   . Hypertension Father   . Hypertension Brother   . Stroke Brother   . Colon cancer Neg Hx   . Esophageal cancer Neg Hx   . Rectal cancer Neg Hx   . Stomach cancer Neg Hx    Past Surgical History:  Procedure Laterality Date  . CESAREAN SECTION    . MOUTH SURGERY  2020   tooth extraction/ bone graft    Social History   Social History  Narrative  . Not on file   Immunization History  Administered Date(s) Administered  . Influenza,inj,Quad PF,6+ Mos 01/11/2016, 03/30/2017  . Influenza-Unspecified 01/23/2018  . Tdap 08/21/2014     Objective: Vital Signs: BP 132/90 (BP Location: Left Arm, Patient Position: Sitting, Cuff Size: Normal)   Pulse 95   Resp 14   Ht 5\' 6"  (1.676 m)   Wt 162 lb (73.5 kg)   BMI 26.15 kg/m    Physical Exam Vitals and nursing note reviewed.  Constitutional:       Appearance: She is well-developed.  HENT:     Head: Normocephalic and atraumatic.  Eyes:     Conjunctiva/sclera: Conjunctivae normal.  Cardiovascular:     Rate and Rhythm: Normal rate and regular rhythm.     Heart sounds: Normal heart sounds.  Pulmonary:     Effort: Pulmonary effort is normal.     Breath sounds: Normal breath sounds.  Abdominal:     General: Bowel sounds are normal.     Palpations: Abdomen is soft.  Musculoskeletal:     Cervical back: Normal range of motion.  Lymphadenopathy:     Cervical: No cervical adenopathy.  Skin:    General: Skin is warm and dry.     Capillary Refill: Capillary refill takes less than 2 seconds.  Neurological:     Mental Status: She is alert and oriented to person, place, and time.  Psychiatric:        Behavior: Behavior normal.      Musculoskeletal Exam: C-spine was in the range of motion.  She has painful limited range of motion of the lumbar spine.  Shoulder joints, elbow joints, wrist joints with good range of motion.  She had bilateral DIP thickening and tenderness.  Hip joints and knee joints with good range of motion.  There was no tenderness over ankles, Achilles tendon or plantar fascia.  She had tenderness across PIPs and DIPs.  CDAI Exam: CDAI Score: -- Patient Global: --; Provider Global: -- Swollen: --; Tender: -- Joint Exam 09/08/2019   No joint exam has been documented for this visit   There is currently no information documented on the homunculus. Go to the Rheumatology activity and complete the homunculus joint exam.  Investigation: No additional findings.  Imaging: MR LUMBAR SPINE WO CONTRAST  Result Date: 09/01/2019 CLINICAL DATA:  Worsening low back pain and right greater than left radiculopathy EXAM: MRI LUMBAR SPINE WITHOUT CONTRAST TECHNIQUE: Multiplanar, multisequence MR imaging of the lumbar spine was performed. No intravenous contrast was administered. COMPARISON:  None. FINDINGS: Segmentation:  Standard.  Alignment: Grade 1 anterolisthesis at L5-S1 with suspected chronic bilateral L5 pars defects. Vertebrae: Vertebral body heights are maintained apart from mild degenerative endplate irregularity. There is no significant marrow edema. No suspicious osseous lesion. Conus medullaris and cauda equina: Conus extends to the L1 level. Conus and cauda equina appear normal. Paraspinal and other soft tissues: Unremarkable. Disc levels: L1-L2:  Disc bulge.  No canal or foraminal stenosis. L2-L3:  Disc bulge.  No canal or foraminal stenosis. L3-L4:  Disc bulge.  No canal or foraminal stenosis. L4-L5: Disc bulge with mild endplate osteophytic ridging. Facet arthropathy. No canal or foraminal stenosis. L5-S1: Anterolisthesis with uncovering of disc bulge. Facet arthropathy. No canal stenosis. Left greater than right foraminal stenosis. IMPRESSION: Left greater than right foraminal stenosis at L5-S1 secondary to chronic bilateral L5 pars defects with grade 1 anterolisthesis. Electronically Signed   By: Macy Mis M.D.   On: 09/01/2019 09:14  Recent Labs: Lab Results  Component Value Date   WBC 12.1 (H) 07/29/2019   HGB 15.6 (H) 07/29/2019   PLT 427 (H) 07/29/2019   NA 137 07/29/2019   K 3.9 07/29/2019   CL 96 (L) 07/29/2019   CO2 30 07/29/2019   GLUCOSE 98 07/29/2019   BUN 15 07/29/2019   CREATININE 0.70 07/29/2019   BILITOT 0.4 07/29/2019   ALKPHOS 65 03/30/2017   AST 15 07/29/2019   ALT 12 07/29/2019   PROT 7.3 07/29/2019   PROT 7.4 07/29/2019   ALBUMIN 4.2 03/30/2017   CALCIUM 10.0 07/29/2019   GFRAA 112 07/29/2019    Speciality Comments: No specialty comments available.  Procedures:  No procedures performed Allergies: Sulfur   Assessment / Plan:     Visit Diagnoses: Psoriatic arthritis (HCC)-patient has erosive psoriatic arthritis with radiographic progression.  She discontinued Enbrel due to frequent infections.  She continues to have pain and discomfort and ongoing swelling.  We applied  for Cosentyx which was declined.  She is hesitant to go on Biologics.  We discussed possible option of Otezla.  Indications side effects contraindications were discussed at length.  She wants to proceed with Rutherford Nail.  Handout was given and consent was taken.  Once approved we will start her on [place].  Counseled patient that [place] Nail is a PDE 4 inhibitor that works to treat psoriasis and the joint pain and tenderness of psoriatic arthritis.  Counseled patient on purpose, proper use, and adverse effects of Otezla.  Reviewed the most common adverse effects of weight loss, depression, nausea/diarrhea/vomiting, headaches, and nasal congestion.  Advised patient to notify office of any serious changes in mood and/or thoughts of suicide.  Provided patient with medication education material and answered all questions.  Patient consented to [place].    Patient dose will be Otezla starter titration pack and then 30 mg twice daily.  Prescription pending insurance approval and once approved patient may pick up sample for starter pack from our office.  High risk medication use - discuss stelara or otezla. cosentyx not approved.  Patient wants to proceed with [facility].  Primary osteoarthritis of both knees-chronic pain  Primary osteoarthritis of both feet-chronic pain  DDD (degenerative disc disease), lumbar-I reviewed MRI results with her.  ANA positive-she has no clinical features of lupus.  Positive QuantiFERON-TB Gold test-she was treated with the rifampin for 4 months.  Chest x-ray on July 31, 2019 was within normal limits.  Other medical problems listed as follows:  History of Meniere's disease  History of ADHD  Essential hypertension  History of herpes simplex infection  Orders: No orders of the defined types were placed in this encounter.  No orders of the defined types were placed in this encounter.    Follow-Up Instructions: Return in about 6 weeks (around 10/20/2019) for Psoriatic  arthritis.   Bo Merino, MD  Note - This record has been created using Editor, commissioning.  Chart creation errors have been sought, but may not always  have been located. Such creation errors do not reflect on  the standard of medical care.

## 2019-09-01 NOTE — Telephone Encounter (Signed)
Please a schedule of appointment to discuss treatment options and discuss results.

## 2019-09-02 ENCOUNTER — Ambulatory Visit: Payer: 59 | Admitting: Rheumatology

## 2019-09-02 NOTE — Telephone Encounter (Signed)
Received a fax regarding Prior Authorization from [facility] for [place]. Authorization has been DENIED because no documentation of failure or intolerance to NSAIDs or steroids and no documentation failure or intolerance to at least 70-month trial of DMARDs and no documentation of trial and failure of to preferred agents: Enbrel, Humira, Otezla, and Stelara prior to Cosentyx.  Patient scheduled for an appointment to discuss medication options and review recent MRI results.  Nothing further needed at this time.   Mariella Saa, PharmD, [place], CPP Clinical Specialty Pharmacist (Rheumatology and Pulmonology)  09/02/2019 8:51 AM

## 2019-09-08 ENCOUNTER — Other Ambulatory Visit: Payer: Self-pay

## 2019-09-08 ENCOUNTER — Encounter: Payer: Self-pay | Admitting: Rheumatology

## 2019-09-08 ENCOUNTER — Telehealth: Payer: Self-pay

## 2019-09-08 ENCOUNTER — Ambulatory Visit: Payer: 59 | Admitting: Rheumatology

## 2019-09-08 VITALS — BP 132/90 | HR 95 | Resp 14 | Ht 66.0 in | Wt 162.0 lb

## 2019-09-08 DIAGNOSIS — L4050 Arthropathic psoriasis, unspecified: Secondary | ICD-10-CM

## 2019-09-08 DIAGNOSIS — M19071 Primary osteoarthritis, right ankle and foot: Secondary | ICD-10-CM | POA: Diagnosis not present

## 2019-09-08 DIAGNOSIS — M170 Bilateral primary osteoarthritis of knee: Secondary | ICD-10-CM | POA: Diagnosis not present

## 2019-09-08 DIAGNOSIS — Z8659 Personal history of other mental and behavioral disorders: Secondary | ICD-10-CM

## 2019-09-08 DIAGNOSIS — Z8669 Personal history of other diseases of the nervous system and sense organs: Secondary | ICD-10-CM

## 2019-09-08 DIAGNOSIS — Z79899 Other long term (current) drug therapy: Secondary | ICD-10-CM

## 2019-09-08 DIAGNOSIS — R768 Other specified abnormal immunological findings in serum: Secondary | ICD-10-CM

## 2019-09-08 DIAGNOSIS — R7612 Nonspecific reaction to cell mediated immunity measurement of gamma interferon antigen response without active tuberculosis: Secondary | ICD-10-CM

## 2019-09-08 DIAGNOSIS — M5136 Other intervertebral disc degeneration, lumbar region: Secondary | ICD-10-CM

## 2019-09-08 DIAGNOSIS — Z8619 Personal history of other infectious and parasitic diseases: Secondary | ICD-10-CM

## 2019-09-08 DIAGNOSIS — M19072 Primary osteoarthritis, left ankle and foot: Secondary | ICD-10-CM

## 2019-09-08 DIAGNOSIS — I10 Essential (primary) hypertension: Secondary | ICD-10-CM

## 2019-09-08 NOTE — Patient Instructions (Signed)
Apremilast oral tablets What is this medicine? APREMILAST (a PRE mil ast) is used to treat plaque psoriasis, psoriatic arthritis, and certain oral ulcers. This medicine may be used for other purposes; ask your health care provider or pharmacist if you have questions. COMMON BRAND NAME(S): Rutherford Nail What should I tell my health care provider before I take this medicine? They need to know if you have any of these conditions:  dehydration  kidney disease  mental illness  an unusual or allergic reaction to apremilast, other medicines, foods, dyes, or preservatives  pregnant or trying to get pregnant  breast-feeding How should I use this medicine? Take this medicine by mouth with a glass of water. Follow the directions on the prescription label. Do not cut, crush or chew this medicine. You can take it with or without food. If it upsets your stomach, take it with food. Take your medicine at regular intervals. Do not take it more often than directed. Do not stop taking except on your doctor's advice. Talk to your pediatrician regarding the use of this medicine in children. Special care may be needed. Overdosage: If you think you have taken too much of this medicine contact a poison control center or emergency room at once. NOTE: This medicine is only for you. Do not share this medicine with others. What if I miss a dose? If you miss a dose, take it as soon as you can. If it is almost time for your next dose, take only that dose. Do not take double or extra doses. What may interact with this medicine? This medicine may interact with the following medications:  certain medicines for seizures like carbamazepine, phenobarbital, phenytoin  rifampin This list may not describe all possible interactions. Give your health care provider a list of all the medicines, herbs, non-prescription drugs, or dietary supplements you use. Also tell them if you smoke, drink alcohol, or use illegal drugs. Some items  may interact with your medicine. What should I watch for while using this medicine? Tell your doctor or healthcare professional if your symptoms do not start to get better or if they get worse. Patients and their families should watch out for new or worsening depression or thoughts of suicide. Also watch out for sudden changes in feelings such as feeling anxious, agitated, panicky, irritable, hostile, aggressive, impulsive, severely restless, overly excited and hyperactive, or not being able to sleep. If this happens, call your health care professional. Check with your doctor or health care professional if you get an attack of severe diarrhea, nausea and vomiting, or if you sweat a lot. The loss of too much body fluid can make it dangerous for you to take this medicine. What side effects may I notice from receiving this medicine? Side effects that you should report to your doctor or health care professional as soon as possible:  depressed mood  weight loss Side effects that usually do not require medical attention (report to your doctor or health care professional if they continue or are bothersome):  diarrhea  headache  nausea, vomiting This list may not describe all possible side effects. Call your doctor for medical advice about side effects. You may report side effects to FDA at 1-800-FDA-1088. Where should I keep my medicine? Keep out of the reach of children. Store below 30 degrees C (86 degrees F). Throw away any unused medicine after the expiration date. NOTE: This sheet is a summary. It may not cover all possible information. If you have questions about this  medicine, talk to your doctor, pharmacist, or health care provider.  2020 Elsevier/Gold Standard (2017-09-10 12:47:14)

## 2019-09-08 NOTE — Telephone Encounter (Signed)
Please apply for [facility], per Dr. Estanislado Pandy. Consent obtained and sent to the scan center.

## 2019-09-08 NOTE — Telephone Encounter (Signed)
Submitted a Prior Authorization request to [facility] for [place] via Cover My Meds. Will update once we receive a response.   (Key: [id number] - [id number]

## 2019-09-10 ENCOUNTER — Ambulatory Visit (INDEPENDENT_AMBULATORY_CARE_PROVIDER_SITE_OTHER): Payer: 59

## 2019-09-10 ENCOUNTER — Telehealth: Payer: Self-pay

## 2019-09-10 ENCOUNTER — Ambulatory Visit: Payer: 59 | Admitting: Orthopaedic Surgery

## 2019-09-10 ENCOUNTER — Encounter: Payer: Self-pay | Admitting: Orthopaedic Surgery

## 2019-09-10 VITALS — BP 145/95 | HR 114 | Ht 66.0 in | Wt 162.0 lb

## 2019-09-10 DIAGNOSIS — M1611 Unilateral primary osteoarthritis, right hip: Secondary | ICD-10-CM

## 2019-09-10 DIAGNOSIS — M25551 Pain in right hip: Secondary | ICD-10-CM | POA: Diagnosis not present

## 2019-09-10 DIAGNOSIS — M25552 Pain in left hip: Secondary | ICD-10-CM

## 2019-09-10 MED ORDER — TRAMADOL HCL 50 MG PO TABS: ORAL_TABLET | ORAL | 0 refills | Status: DC

## 2019-09-10 NOTE — Progress Notes (Signed)
Office Visit Note   Patient: Valerie Delgado           Date of Birth: [birth year]           MRN: [id number] Visit Date: 09/10/2019              Requested by: Glenis Smoker, MD [place],  [place] [id number] PCP: Glenis Smoker, MD   Assessment & Plan: Visit Diagnoses:  1. Bilateral hip pain   2. Unilateral primary osteoarthritis, right hip     Plan: Patient does have pars defects L5 and some right foraminal stenosis but in the last 3 years has developed severe progression of bilateral hip osteoarthritis much worse on the right than left which is symptomatic.  She is used a cane taken anti-inflammatories were without relief has subchondral sclerosis no joint space and significant limp.  Patient would like to proceed with total hip arthroplasty.  We discussed direct anterior approach overnight stay in the hospital use of a walker for a few days and then working her way onto a cane.  Questions were elicited and answered.  She understands request to proceed.  Follow-Up Instructions: No follow-ups on file.   Orders:  Orders Placed This Encounter  Procedures  . XR HIPS BILAT W OR W/O PELVIS 3-4 VIEWS   Meds ordered this encounter  Medications  . traMADol (ULTRAM) 50 MG tablet    Sig: One tablet twice daily prn severe pain.    Dispense:  30 tablet    Refill:  0      Procedures: No procedures performed   Clinical Data: No additional findings.   Subjective: Chief Complaint  Patient presents with  . Lower Back - Pain, Follow-up    MRI Lumbar Review    HPI 54 year old female with significant increase in pain in her right groin that radiates down just to her right knee.  She has known spondylolisthesis L5-S1 with pars defects.  She denies numbness and tingling in her feet she is having great difficulty walking pain at the end of the day gets some relief when she sits.  No neurogenic claudication symptoms.  MRI scan was ordered by Benjiman Core, PA and  on 09/01/2019.  This showed bilateral pars defects L5 with right foraminal stenosis L5-S1 and grade 1 anterolisthesis.  She states her ability to walk and move her hip has significantly gone down in the last year.  Hip x-rays August 2018 ordered by Dr. Desiree Hane showed some mild hip space narrowing and mild lateral uncoverage.  Review of Systems 14 point review of system positive fatigue right greater than left hip pain that radiates down almost to the knee.  Positive arthralgias some morning stiffness.  Cardiovascular negative.  The rest 14 point systems noncontributory.   Objective: Vital Signs: BP (!) 145/95   Pulse (!) 114   Ht 5\' 6"  (1.676 m)   Wt 162 lb (73.5 kg)   BMI 26.15 kg/m   Physical Exam Constitutional:      Appearance: She is well-developed.  HENT:     Head: Normocephalic.     Right Ear: External ear normal.     Left Ear: External ear normal.  Eyes:     Pupils: Pupils are equal, round, and reactive to light.  Neck:     Thyroid: No thyromegaly.     Trachea: No tracheal deviation.  Cardiovascular:     Rate and Rhythm: Normal rate.  Pulmonary:     Effort: Pulmonary  effort is normal.  Abdominal:     Palpations: Abdomen is soft.  Skin:    General: Skin is warm and dry.  Neurological:     Mental Status: She is alert and oriented to person, place, and time.  Psychiatric:        Behavior: Behavior normal.     Ortho Exam patient has reproduction of her pain with internal rotation of 20 degrees right hip.  She cannot figure 4 has problems putting on her sock.  Positive Trendelenburg gait right worse than left.  She does have some hamstring tightness negative popliteal compression test.  Left hip has 20 degrees internal rotation with some groin pain not severe.  She can figure for the left hip not with her right.  Distal pulses are 2+.  Knee and ankle jerk are intact.  Specialty Comments:  No specialty comments available.  Imaging: No results found.  CLINICAL DATA:   Worsening low back pain and right greater than left radiculopathy  EXAM: MRI LUMBAR SPINE WITHOUT CONTRAST  TECHNIQUE: Multiplanar, multisequence MR imaging of the lumbar spine was performed. No intravenous contrast was administered.  COMPARISON:  None.  FINDINGS: Segmentation:  Standard.  Alignment: Grade 1 anterolisthesis at L5-S1 with suspected chronic bilateral L5 pars defects.  Vertebrae: Vertebral body heights are maintained apart from mild degenerative endplate irregularity. There is no significant marrow edema. No suspicious osseous lesion.  Conus medullaris and cauda equina: Conus extends to the L1 level. Conus and cauda equina appear normal.  Paraspinal and other soft tissues: Unremarkable.  Disc levels:  L1-L2:  Disc bulge.  No canal or foraminal stenosis.  L2-L3:  Disc bulge.  No canal or foraminal stenosis.  L3-L4:  Disc bulge.  No canal or foraminal stenosis.  L4-L5: Disc bulge with mild endplate osteophytic ridging. Facet arthropathy. No canal or foraminal stenosis.  L5-S1: Anterolisthesis with uncovering of disc bulge. Facet arthropathy. No canal stenosis. Left greater than right foraminal stenosis.  IMPRESSION: Left greater than right foraminal stenosis at L5-S1 secondary to chronic bilateral L5 pars defects with grade 1 anterolisthesis.   Electronically Signed   By: Macy Mis M.D.   On: 09/01/2019 09:14 PMFS History: Patient Active Problem List   Diagnosis Date Noted  . Unilateral primary osteoarthritis, right hip 09/11/2019  . Decreased visual acuity 03/30/2017  . Estrogen deficiency 03/30/2017  . Inflammatory arthritis 11/13/2016  . Essential hypertension 09/19/2016  . History of anxiety and depression 09/19/2016  . History of Meniere's disease 09/19/2016  . History of hearing loss 09/19/2016  . History of ADHD 09/19/2016  . Dyspareunia, female 01/11/2016  . Routine general medical examination at a health care  facility 01/11/2016  . Smoker 09/01/2015  . Anxiety and depression 09/01/2015  . Positive QuantiFERON-TB Gold test 09/01/2015  . History of herpes simplex infection 09/01/2015   Past Medical History:  Diagnosis Date  . Allergy   . Anxiety   . Arthritis   . Depression   . Hypertension   . Meniere's disease of left ear   . Psoriatic arthritis ([place])    Dr. Estanislado Pandy  . Tuberculosis    started treatment on 08/30/2015    Family History  Problem Relation Age of Onset  . Hypertension Mother   . Cancer Mother        breast  . Arthritis Mother   . Depression Mother   . Hypertension Father   . Hypertension Brother   . Stroke Brother   . Colon cancer Neg Hx   .  Esophageal cancer Neg Hx   . Rectal cancer Neg Hx   . Stomach cancer Neg Hx     Past Surgical History:  Procedure Laterality Date  . CESAREAN SECTION    . MOUTH SURGERY  2020   tooth extraction/ bone graft    Social History   Occupational History  . Not on file  Tobacco Use  . Smoking status: Current Every Day Smoker    Years: 30.00  . Smokeless tobacco: Never Used  Vaping Use  . Vaping Use: Never used  Substance and Sexual Activity  . Alcohol use: Yes    Comment: occ  . Drug use: No  . Sexual activity: Not on file    Comment: lives with her mother. 1 child age 65.

## 2019-09-10 NOTE — Telephone Encounter (Signed)
Faxed patient chart notes to [facility] for [place] prior auth.

## 2019-09-10 NOTE — Telephone Encounter (Signed)
As patient was leaving the office today she requested something for pain. I ask Dr.[name] and he called in tramadol to pharmacy. I called and notified patient that this has been sent in.

## 2019-09-11 DIAGNOSIS — M1611 Unilateral primary osteoarthritis, right hip: Secondary | ICD-10-CM | POA: Insufficient documentation

## 2019-09-11 MED ORDER — APREMILAST 30 MG PO TABS: 30.0000 mg | ORAL_TABLET | Freq: Two times a day (BID) | ORAL | 1 refills | Status: DC

## 2019-09-11 MED ORDER — APREMILAST 10 & 20 & 30 MG PO TBPK: ORAL_TABLET | ORAL | 0 refills | Status: DC

## 2019-09-11 NOTE — Telephone Encounter (Signed)
Called to notify patient of approval.  No answer.  Left message notifying of approval and a prescription sent to [facility].  Directed patient to manufacture website for co-pay card.  Instructed patient to call with any questions or concerns.   Mariella Saa, PharmD, [place], CPP Clinical Specialty Pharmacist (Rheumatology and Pulmonology)  09/11/2019 9:51 AM

## 2019-09-11 NOTE — Telephone Encounter (Signed)
Received notification from [facility] regarding a prior authorization for [place]. Authorization has been APPROVED from 09/10/19 to 12/09/19.   Authorization # [id number]  Per plan, patient must fill through [facility].

## 2019-09-11 NOTE — Addendum Note (Signed)
Addended by: Mariella Saa C on: 09/11/2019 09:52 AM   Modules accepted: Orders

## 2019-09-12 ENCOUNTER — Telehealth: Payer: Self-pay | Admitting: Rheumatology

## 2019-09-12 NOTE — Telephone Encounter (Signed)
Patient called stating she was returning Amber's call regarding medication approval.  Patient states she will have to hold off on taking the medication at this time.  Patient states she is scheduled for right hip surgery with Dr. Lorin Mercy on 09/29/19.

## 2019-09-15 NOTE — Telephone Encounter (Signed)
Thank you for informing me. Closing encounter.   Mariella Saa, PharmD, [place], CPP Clinical Specialty Pharmacist (Rheumatology and Pulmonology)  09/15/2019 8:37 AM

## 2019-09-16 ENCOUNTER — Telehealth: Payer: Self-pay | Admitting: Orthopaedic Surgery

## 2019-09-16 NOTE — Telephone Encounter (Signed)
FYI

## 2019-09-16 NOTE — Telephone Encounter (Signed)
Patient's total hip surgery has been denied by [facility] stating services are not covered by patient's health plan because it does not meet criteria. Patient is a current smoker and must have stopped smoking 6 weeks prior to the request for procedure.  Patient is aware and stated she will make an appointment with PCP to discuss her smoking habit and how to stop.

## 2019-09-17 ENCOUNTER — Ambulatory Visit (INDEPENDENT_AMBULATORY_CARE_PROVIDER_SITE_OTHER): Payer: 59 | Admitting: Surgery

## 2019-09-17 ENCOUNTER — Encounter: Payer: Self-pay | Admitting: Surgery

## 2019-09-17 DIAGNOSIS — M25552 Pain in left hip: Secondary | ICD-10-CM

## 2019-09-17 DIAGNOSIS — M25551 Pain in right hip: Secondary | ICD-10-CM

## 2019-09-17 NOTE — Progress Notes (Signed)
Patient came in today with questions regarding her total hip replacement that was canceled.  Her insurance company wanted her to discontinue smoking for at least 6 weeks before surgery.  Patient will follow up with Dr. Lorin Mercy in 5 weeks for recheck to see how she is doing with that.  Our surgery scheduler will reach out to patient's primary care physician to see if they can help assist with that.

## 2019-09-23 ENCOUNTER — Inpatient Hospital Stay (HOSPITAL_COMMUNITY): Admission: RE | Admit: 2019-09-23 | Payer: 59 | Source: Ambulatory Visit

## 2019-09-25 ENCOUNTER — Other Ambulatory Visit (HOSPITAL_COMMUNITY): Payer: 59

## 2019-09-29 ENCOUNTER — Ambulatory Visit: Admit: 2019-09-29 | Payer: 59 | Admitting: Orthopaedic Surgery

## 2019-09-29 SURGERY — ARTHROPLASTY, HIP, TOTAL, ANTERIOR APPROACH
Anesthesia: Spinal | Site: Hip | Laterality: Right

## 2019-10-01 NOTE — Telephone Encounter (Signed)
I had a detailed discussion with the patient.  In case she starts [place] she may be able to continue it through the surgery.  As she may experience some of the side effects including nausea and diarrhea she may want to delay starting [place] until she gets total hip replacement within a month.

## 2019-10-09 ENCOUNTER — Ambulatory Visit: Payer: 59 | Admitting: Physician Assistant

## 2019-10-10 NOTE — Progress Notes (Deleted)
Office Visit Note  Patient: Valerie Delgado             Date of Birth: 1965/04/06           MRN: [id number]             PCP: Glenis Smoker, MD Referring: Glenis Smoker, * Visit Date: 10/23/2019 Occupation: @GUAROCC @  Subjective:  No chief complaint on file.   History of Present Illness: Valerie Delgado is a 54 y.o. female ***   Activities of Daily Living:  Patient reports morning stiffness for *** {minute/hour:19697}.   Patient {ACTIONS;DENIES/REPORTS:21021675::"Denies"} nocturnal pain.  Difficulty dressing/grooming: {ACTIONS;DENIES/REPORTS:21021675::"Denies"} Difficulty climbing stairs: {ACTIONS;DENIES/REPORTS:21021675::"Denies"} Difficulty getting out of chair: {ACTIONS;DENIES/REPORTS:21021675::"Denies"} Difficulty using hands for taps, buttons, cutlery, and/or writing: {ACTIONS;DENIES/REPORTS:21021675::"Denies"}  No Rheumatology ROS completed.   PMFS History:  Patient Active Problem List   Diagnosis Date Noted  . Unilateral primary osteoarthritis, right hip 09/11/2019  . Decreased visual acuity 03/30/2017  . Estrogen deficiency 03/30/2017  . Inflammatory arthritis 11/13/2016  . Essential hypertension 09/19/2016  . History of anxiety and depression 09/19/2016  . History of Meniere's disease 09/19/2016  . History of hearing loss 09/19/2016  . History of ADHD 09/19/2016  . Dyspareunia, female 01/11/2016  . Routine general medical examination at a health care facility 01/11/2016  . Smoker 09/01/2015  . Anxiety and depression 09/01/2015  . Positive QuantiFERON-TB Gold test 09/01/2015  . History of herpes simplex infection 09/01/2015    Past Medical History:  Diagnosis Date  . Allergy   . Anxiety   . Arthritis   . Depression   . Hypertension   . Meniere's disease of left ear   . Psoriatic arthritis ([place])    Dr. Estanislado Pandy  . Tuberculosis    started treatment on 08/30/2015    Family History  Problem Relation Age of Onset  . Hypertension Mother   .  Cancer Mother        breast  . Arthritis Mother   . Depression Mother   . Hypertension Father   . Hypertension Brother   . Stroke Brother   . Colon cancer Neg Hx   . Esophageal cancer Neg Hx   . Rectal cancer Neg Hx   . Stomach cancer Neg Hx    Past Surgical History:  Procedure Laterality Date  . CESAREAN SECTION    . MOUTH SURGERY  2020   tooth extraction/ bone graft    Social History   Social History Narrative  . Not on file   Immunization History  Administered Date(s) Administered  . Influenza,inj,Quad PF,6+ Mos 01/11/2016, 03/30/2017  . Influenza-Unspecified 01/23/2018  . Tdap 08/21/2014     Objective: Vital Signs: There were no vitals taken for this visit.   Physical Exam   Musculoskeletal Exam: ***  CDAI Exam: CDAI Score: -- Patient Global: --; Provider Global: -- Swollen: --; Tender: -- Joint Exam 10/23/2019   No joint exam has been documented for this visit   There is currently no information documented on the homunculus. Go to the Rheumatology activity and complete the homunculus joint exam.  Investigation: No additional findings.  Imaging: No results found.  Recent Labs: Lab Results  Component Value Date   WBC 12.1 (H) 07/29/2019   HGB 15.6 (H) 07/29/2019   PLT 427 (H) 07/29/2019   NA 137 07/29/2019   K 3.9 07/29/2019   CL 96 (L) 07/29/2019   CO2 30 07/29/2019   GLUCOSE 98 07/29/2019   BUN 15 07/29/2019   CREATININE 0.70 07/29/2019  BILITOT 0.4 07/29/2019   ALKPHOS 65 03/30/2017   AST 15 07/29/2019   ALT 12 07/29/2019   PROT 7.3 07/29/2019   PROT 7.4 07/29/2019   ALBUMIN 4.2 03/30/2017   CALCIUM 10.0 07/29/2019   GFRAA 112 07/29/2019    Speciality Comments: No specialty comments available.  Procedures:  No procedures performed Allergies: Sulfur   Assessment / Plan:     Visit Diagnoses: No diagnosis found.  Orders: No orders of the defined types were placed in this encounter.  No orders of the defined types were placed  in this encounter.   Face-to-face time spent with patient was *** minutes. Greater than 50% of time was spent in counseling and coordination of care.  Follow-Up Instructions: No follow-ups on file.   Earnestine Mealing, CMA  Note - This record has been created using Editor, commissioning.  Chart creation errors have been sought, but may not always  have been located. Such creation errors do not reflect on  the standard of medical care.

## 2019-10-14 ENCOUNTER — Inpatient Hospital Stay: Payer: 59 | Admitting: Orthopaedic Surgery

## 2019-10-22 ENCOUNTER — Encounter: Payer: Self-pay | Admitting: Orthopaedic Surgery

## 2019-10-22 ENCOUNTER — Ambulatory Visit (INDEPENDENT_AMBULATORY_CARE_PROVIDER_SITE_OTHER): Payer: 59 | Admitting: Orthopaedic Surgery

## 2019-10-22 VITALS — BP 151/114 | HR 87 | Ht 66.0 in | Wt 160.0 lb

## 2019-10-22 DIAGNOSIS — M1611 Unilateral primary osteoarthritis, right hip: Secondary | ICD-10-CM

## 2019-10-22 NOTE — Progress Notes (Signed)
Office Visit Note   Patient: Valerie Delgado           Date of Birth: Oct 15, 1965           MRN: [id number] Visit Date: 10/22/2019              Requested by: Glenis Smoker, MD [place],  [place] [id number] PCP: Glenis Smoker, MD   Assessment & Plan: Visit Diagnoses:  1. Unilateral primary osteoarthritis, right hip     Plan: Patient will call in couple weeks to schedule total hip arthroplasty on the right hip as planned.  Currently elective surgery is on hold with Covid surge.  Plan spinal and overnight stay due to the severe arthritis in her opposite left hip.  Follow-Up Instructions: No follow-ups on file.   Orders:  No orders of the defined types were placed in this encounter.  No orders of the defined types were placed in this encounter.     Procedures: No procedures performed   Clinical Data: No additional findings.   Subjective: Chief Complaint  Patient presents with   Right Hip - Follow-up    Discuss rescheduling surgery    HPI 54 year old female returns with a severe right hip osteoarthritis.  She quit smoking 5 weeks ago.  She states she had some problems with nicotine withdrawal but now is doing well.  She is anxious to get her hip replacement surgery done so that she can look for job interviews and get back working.  States she has severe pain in her groin that radiates down to her knees.  At times she is barely able to make in the bathroom.  She is going to borrow her mother-in-law's walker.  Review of Systems Updated unchanged.  Objective: Vital Signs: BP (!) 151/114    Pulse 87    Ht 5\' 6"  (1.676 m)    Wt 160 lb (72.6 kg)    BMI 25.82 kg/m   Physical Exam Constitutional:      Appearance: She is well-developed.  HENT:     Head: Normocephalic.     Right Ear: External ear normal.     Left Ear: External ear normal.  Eyes:     Pupils: Pupils are equal, round, and reactive to light.  Neck:     Thyroid: No thyromegaly.      Trachea: No tracheal deviation.  Cardiovascular:     Rate and Rhythm: Normal rate.  Pulmonary:     Effort: Pulmonary effort is normal.  Abdominal:     Palpations: Abdomen is soft.  Skin:    General: Skin is warm and dry.  Neurological:     Mental Status: She is alert and oriented to person, place, and time.  Psychiatric:        Behavior: Behavior normal.     Ortho Exam bilateral hip flexion contracture 15 degrees.  0 degrees internal rotation right left hip with groin pain that radiates to the supracondylar region of the femur.  No crepitus with knee range of motion distal pulses are intact. Specialty Comments:  No specialty comments available.  Imaging: No results found.   PMFS History: Patient Active Problem List   Diagnosis Date Noted   Unilateral primary osteoarthritis, right hip 09/11/2019   Decreased visual acuity 03/30/2017   Estrogen deficiency 03/30/2017   Inflammatory arthritis 11/13/2016   Essential hypertension 09/19/2016   History of anxiety and depression 09/19/2016   History of Meniere's disease 09/19/2016   History of hearing  loss 09/19/2016   History of ADHD 09/19/2016   Dyspareunia, female 01/11/2016   Routine general medical examination at a health care facility 01/11/2016   Smoker 09/01/2015   Anxiety and depression 09/01/2015   Positive QuantiFERON-TB Gold test 09/01/2015   History of herpes simplex infection 09/01/2015   Past Medical History:  Diagnosis Date   Allergy    Anxiety    Arthritis    Depression    Hypertension    Meniere's disease of left ear    Psoriatic arthritis ([place])    Dr. Estanislado Pandy   Tuberculosis    started treatment on 08/30/2015    Family History  Problem Relation Age of Onset   Hypertension Mother    Cancer Mother        breast   Arthritis Mother    Depression Mother    Hypertension Father    Hypertension Brother    Stroke Brother    Colon cancer Neg Hx    Esophageal  cancer Neg Hx    Rectal cancer Neg Hx    Stomach cancer Neg Hx     Past Surgical History:  Procedure Laterality Date   CESAREAN SECTION     MOUTH SURGERY  2020   tooth extraction/ bone graft    Social History   Occupational History   Not on file  Tobacco Use   Smoking status: Current Every Day Smoker    Years: 30.00   Smokeless tobacco: Never Used  Scientific laboratory technician Use: Never used  Substance and Sexual Activity   Alcohol use: Yes    Comment: occ   Drug use: No   Sexual activity: Not on file    Comment: lives with her mother. 1 child age 36.

## 2019-10-23 ENCOUNTER — Ambulatory Visit: Payer: 59 | Admitting: Physician Assistant

## 2019-10-23 DIAGNOSIS — M170 Bilateral primary osteoarthritis of knee: Secondary | ICD-10-CM

## 2019-10-23 DIAGNOSIS — M5136 Other intervertebral disc degeneration, lumbar region: Secondary | ICD-10-CM

## 2019-10-23 DIAGNOSIS — Z8669 Personal history of other diseases of the nervous system and sense organs: Secondary | ICD-10-CM

## 2019-10-23 DIAGNOSIS — Z8659 Personal history of other mental and behavioral disorders: Secondary | ICD-10-CM

## 2019-10-23 DIAGNOSIS — L4050 Arthropathic psoriasis, unspecified: Secondary | ICD-10-CM

## 2019-10-23 DIAGNOSIS — Z79899 Other long term (current) drug therapy: Secondary | ICD-10-CM

## 2019-10-23 DIAGNOSIS — Z8619 Personal history of other infectious and parasitic diseases: Secondary | ICD-10-CM

## 2019-10-23 DIAGNOSIS — R768 Other specified abnormal immunological findings in serum: Secondary | ICD-10-CM

## 2019-10-23 DIAGNOSIS — R7612 Nonspecific reaction to cell mediated immunity measurement of gamma interferon antigen response without active tuberculosis: Secondary | ICD-10-CM

## 2019-10-23 DIAGNOSIS — I10 Essential (primary) hypertension: Secondary | ICD-10-CM

## 2019-10-23 DIAGNOSIS — M19071 Primary osteoarthritis, right ankle and foot: Secondary | ICD-10-CM

## 2019-10-29 ENCOUNTER — Telehealth: Payer: Self-pay | Admitting: Rheumatology

## 2019-10-29 ENCOUNTER — Telehealth: Payer: Self-pay | Admitting: Orthopaedic Surgery

## 2019-10-29 NOTE — Telephone Encounter (Signed)
Please advise. Thanks.  

## 2019-10-29 NOTE — Telephone Encounter (Signed)
I called her and discussed. Per her office note her opposite hip also has severe osteoarthritis and I do not think she be able to have surgery and go home the same day since her other hip is so severe.  Best to have her stay overnight.  She understands and we will try to get her scheduled soon as they open up post Covid restrictions.

## 2019-10-29 NOTE — Telephone Encounter (Signed)
Patient calling to request an order for a Nicotine test to be called in at [facility] on [facility] [facility].  Also patient would like to schedule surgery now.  I explained we are not permitted to schedule electives until mid October unless surgery is a priority one with overnight observation,  or the doctor decides patient is suitable for same day surgery and discharge. Patient states she has no issues with going home the same day. Please advise.

## 2019-10-29 NOTE — Telephone Encounter (Signed)
Patient missed last appointment because she thought it was a follow up for Cosentyx, and that was not approved by insurance, Patient did start on Otezla, but it has made depression much worse, and gave patient a sore stomach.  Patient wants to discuss how to get Cosentyx approved, and when she needs to follow up in office. Please call to discuss.

## 2019-10-30 NOTE — Telephone Encounter (Signed)
We can apply for Cosentyx.  We can obtain consent and do the counseling at the same visit when the first injection is given.

## 2019-10-30 NOTE — Telephone Encounter (Signed)
Submitted a Prior Authorization request to [facility] for [place] via Cover My Meds. Will update once we receive a response.   (Key: [id number]

## 2019-11-03 NOTE — Telephone Encounter (Signed)
Cosentyx was approved through insurance.  Please schedule new start appointment.

## 2019-11-03 NOTE — Telephone Encounter (Signed)
Received notification from [facility] regarding a prior authorization for Valerie Delgado. Authorization has been APPROVED from 11/01/19 to 01/30/20.   Authorization # [id number]  Per plan patient must fill through [place]. Patient can use a copay card.

## 2019-11-05 ENCOUNTER — Other Ambulatory Visit: Payer: Self-pay

## 2019-11-05 ENCOUNTER — Ambulatory Visit (INDEPENDENT_AMBULATORY_CARE_PROVIDER_SITE_OTHER): Payer: 59 | Admitting: Pharmacist

## 2019-11-05 VITALS — BP 142/97 | HR 101

## 2019-11-05 DIAGNOSIS — L4050 Arthropathic psoriasis, unspecified: Secondary | ICD-10-CM

## 2019-11-05 MED ORDER — COSENTYX SENSOREADY (300 MG) 150 MG/ML ~~LOC~~ SOAJ: SUBCUTANEOUS | 0 refills | Status: DC

## 2019-11-05 NOTE — Patient Instructions (Signed)
Cosentyx Dosing: Inject 2 pens every 7 days for 5 weeks and then every 28 days. (Dates: 9/15, 9/22, 9/29, 10/6, 10/13, then 11/10)  Remember the 5 C's:  COUNTER- leave on the counter at least 30 mins but up to overnight to bring medication to room temperature and prevent stinging  COLD- Placing something cold (like and ice gel pack or cold water bottle) on the injection site just before cleansing with alcohol may help reduce pain  CLARITIN- for the first two weeks of treatment or  the day of, the day before, and the day after injecting to minimize injection site reactions  CORTISONE CREAM- apply if injection site is irritated and itching  CALL ME- if injection site reaction is bigger than the size of your fist, looks infected, blisters, or develop hives  Standing Labs We placed an order today for your standing lab work.   Please have your standing labs drawn in 1 month and 3 month.  If possible, please have your labs drawn 2 weeks prior to your appointment so that the provider can discuss your results at your appointment.  We have open lab daily Monday through Thursday from 8:30-12:30 PM and 1:30-4:30 PM and Friday from 8:30-12:30 PM and 1:30-4:00 PM at the office of Dr. Bo Merino, [facility].   Please be advised, patients with office appointments requiring lab work will take precedents over walk-in lab work.  If possible, please come for your lab work on Monday and Friday afternoons, as you may experience shorter wait times. The office is located at [address], [place], [place], [place] [address] No appointment is necessary.   Labs are drawn by [facility]. Please bring your co-pay at the time of your lab draw.  You may receive a bill from [facility] for your lab work.  If you wish to have your labs drawn at another location, please call the office 24 hours in advance to send orders.  If you have any questions regarding directions or hours of operation,  please call  [phone].   As a reminder, please drink plenty of water prior to coming for your lab work. Thanks!

## 2019-11-05 NOTE — Progress Notes (Signed)
Pharmacy Note  Subjective:   Patient presents to clinic today to receive first dose of Cosentyx.  Patient running a fever or have signs/symptoms of infection? No  Patient currently on antibiotics for the treatment of infection? No  Patient have any upcoming invasive procedures/surgeries? No  Objective: CMP     Component Value Date/Time   NA 137 07/29/2019 1046   NA 143 03/30/2017 0931   K 3.9 07/29/2019 1046   CL 96 (L) 07/29/2019 1046   CO2 30 07/29/2019 1046   GLUCOSE 98 07/29/2019 1046   BUN 15 07/29/2019 1046   BUN 14 03/30/2017 0931   CREATININE 0.70 07/29/2019 1046   CALCIUM 10.0 07/29/2019 1046   PROT 7.3 07/29/2019 1046   PROT 7.4 07/29/2019 1046   PROT 6.7 03/30/2017 0931   ALBUMIN 4.2 03/30/2017 0931   AST 15 07/29/2019 1046   ALT 12 07/29/2019 1046   ALKPHOS 65 03/30/2017 0931   BILITOT 0.4 07/29/2019 1046   BILITOT 0.3 03/30/2017 0931   GFRNONAA 97 07/29/2019 1046   GFRAA 112 07/29/2019 1046    CBC    Component Value Date/Time   WBC 12.1 (H) 07/29/2019 1046   RBC 4.95 07/29/2019 1046   HGB 15.6 (H) 07/29/2019 1046   HGB 13.5 03/30/2017 0931   HCT 46.0 (H) 07/29/2019 1046   HCT 40.3 03/30/2017 0931   PLT 427 (H) 07/29/2019 1046   PLT 351 03/30/2017 0931   MCV 92.9 07/29/2019 1046   MCV 94 03/30/2017 0931   MCH 31.5 07/29/2019 1046   MCHC 33.9 07/29/2019 1046   RDW 12.2 07/29/2019 1046   RDW 13.4 03/30/2017 0931   LYMPHSABS 3,521 07/29/2019 1046   LYMPHSABS 2.5 03/30/2017 0931   MONOABS 707 10/17/2016 1046   EOSABS 73 07/29/2019 1046   EOSABS 0.2 03/30/2017 0931   BASOSABS 61 07/29/2019 1046   BASOSABS 0.1 03/30/2017 0931    Baseline Immunosuppressant Therapy Labs TB GOLD Positive QuantiFERON-TB Gold test. she was treated with the rifampin for 4 months.  Chest x-ray on July 31, 2019 was within normal limits.  Hepatitis Panel Hepatitis Latest Ref Rng & Units 07/29/2019  Hep B Surface Ag NON-REACTI NON-REACTIVE  Hep B IgM NON-REACTI  NON-REACTIVE  Hep C Ab NON-REACTI NON-REACTIVE  Hep C Ab NON-REACTI NON-REACTIVE   HIV Lab Results  Component Value Date   HIV NON-REACTIVE 07/29/2019   Immunoglobulins Immunoglobulin Electrophoresis Latest Ref Rng & Units 07/29/2019  IgA  47 - 310 mg/dL 402(H)  IgG 600 - 1,640 mg/dL 1,293  IgM 50 - 300 mg/dL 59   SPEP Serum Protein Electrophoresis Latest Ref Rng & Units 07/29/2019  Total Protein 6.1 - 8.1 g/dL 7.4  Albumin 3.8 - 4.8 g/dL 4.3  Alpha-1 0.2 - 0.3 g/dL 0.3  Alpha-2 0.5 - 0.9 g/dL 0.7  Beta Globulin 0.4 - 0.6 g/dL 0.5  Beta 2 0.2 - 0.5 g/dL 0.5  Gamma Globulin 0.8 - 1.7 g/dL 1.1   G6PD No results found for: G6PDH TPMT No results found for: TPMT   Assessment/Plan:   Counseled patient that Cosentyx is a IL-17 inhibitor.  Counseled patient on purpose, proper use, and adverse effects of Cosentyx. Reviewed the most common adverse effects of infection, inflammatory bowel disease, and allergic reaction.  Counseled patient that Cosentyx should be held prior to scheduled surgery.    Patient dose will be for plaque psoriasis +/- psoriatic arthritis 300 mg every 7 days for 5 weeks then 300 mg every 28 days.  Prescription will  be sent to pharmacy pending lab results and insurance approval.  Demonstrated proper injection technique with Cosentyx demo pen.  Patient able to demonstrate proper injection technique using the teach back method.  Patient self injected in the abdomen with:  Sample Medication: Cosentyx 150 mg/ml x 2 Lot: [id number] Expiration: 03/2021  Patient given sample with same lot and expiration as above in case of shipping delays.  Patient tolerated well.  Observed for 30 mins in office for adverse reaction and none noted.   Patient is to return in 6-8 weeks for follow up appointment and 1 month for labs.  Standing orders placed. Prescription sent to [facility] required per insurance.  Patient given instructions on how to sign up for co-pay card.  All  questions encouraged and answered.  Instructed patient to call with any further questions or concerns.  Mariella Saa, PharmD, [facility] Rheumatology Clinical Pharmacist  11/05/2019 10:41 AM

## 2019-11-11 ENCOUNTER — Telehealth: Payer: Self-pay | Admitting: Rheumatology

## 2019-11-11 DIAGNOSIS — L4050 Arthropathic psoriasis, unspecified: Secondary | ICD-10-CM

## 2019-11-11 MED ORDER — COSENTYX SENSOREADY (300 MG) 150 MG/ML ~~LOC~~ SOAJ: SUBCUTANEOUS | 0 refills | Status: DC

## 2019-11-11 NOTE — Telephone Encounter (Signed)
Per phone note on 10/29/2019: Per plan patient must fill through [facility]. Patient can use a copay card.  Resent prescription to [place] as it was sent to [facility] at patient's new start visit.

## 2019-11-11 NOTE — Telephone Encounter (Signed)
[facility] called to request prescription refill of Cosentyx for the patient.

## 2019-11-14 ENCOUNTER — Telehealth: Payer: Self-pay | Admitting: Radiology

## 2019-11-14 DIAGNOSIS — F17200 Nicotine dependence, unspecified, uncomplicated: Secondary | ICD-10-CM

## 2019-11-14 NOTE — Telephone Encounter (Signed)
Valerie Killings, MD  [name], RT OK send her for a nicotine test. Likely can be done with urine . thanks       Previous Messages   ----- Message -----  From: [name], RT  Sent: 11/12/2019  2:17 PM EDT  To: Valerie Killings, MD  Subject: FW: ORDER FOR [facility]               Please advise. Is this something that we can order?  ----- Message -----  From: [name]  Sent: 11/12/2019  1:46 PM EDT  To: [name], RT  Subject: [place]                 Patient called today requesting we provide an order for a nicotine test. She states she spoke to Dr Lorin Mercy about the surgery not taking place until she could be scheduled for overnight observation, however the order for the nicotine was not addressed. Patient received a denial letter from [facility] because she does not meet the criteria for surgery while she is still a smoker. Patient states will need the order and test to be taken care of prior to scheduling. If there is a peer to peer necessary she will have met the criteria. (no longer smoking). We are holding a surgery date of 12-17-19 @12 :30pm. The end date to meet the criteria can be no later than 12-28-19.   If there are any questions, please call patient at the following number:   [phone]

## 2019-11-14 NOTE — Telephone Encounter (Signed)
This order has been entered for lab collect at [facility].  Please let me know if you need anything further from me.

## 2019-11-19 ENCOUNTER — Telehealth: Payer: Self-pay

## 2019-11-19 NOTE — Telephone Encounter (Signed)
Patient left a voicemail stating she is having a difficult time with [facility] refilling her Cosentyx medication.  Patient states they received the prescription from Dr. Estanislado Pandy, however, it was written for more than what [facility] will or can fill.  Patient requested a return call.

## 2019-11-20 NOTE — Telephone Encounter (Signed)
Called [facility], quantity limit PA for Cosentyx loading doses is still pending. Should receive determination by Monday.  Phone# [phone]

## 2019-11-20 NOTE — Telephone Encounter (Signed)
Received fax form [facility] that patient's Cosentyx loading doses have been approved. [facility] [employer] [facility], they were aware and will be reaching out to patient to schedule.   Called patient, left message.

## 2019-11-20 NOTE — Telephone Encounter (Signed)
Medication Samples have been provided to the patient.  Drug name: Cosentyx      Strength: 300 mg        Qty: 1 LOT: [id number] Exp.Date: 05/2021  Dosing instructions: Inject 2 pens in the skin every 7 days for 5 weeks then 2 pens every 28 days.  The patient has been instructed regarding the correct time, dose, and frequency of taking this medication, including desired effects and most common side effects.   Gwenlyn Perking 3:59 PM 11/20/2019

## 2019-11-22 LAB — NICOTINE SCREEN, URINE: Cotinine Ql Scrn, Ur: NEGATIVE ng/mL

## 2019-11-28 ENCOUNTER — Telehealth: Payer: Self-pay | Admitting: Orthopaedic Surgery

## 2019-11-28 NOTE — Telephone Encounter (Signed)
FYI

## 2019-11-28 NOTE — Telephone Encounter (Signed)
Please see the following message from patient:    Valerie Delgado,  I'm just trying to see you can give me a confirmation for my surgery date that you tentatively is scheduled for 10/27. Has [facility] been sent the Negative nicotine results and approved the surgery? I am desperately trying to get the right hip replacement surgery ASAP. Both of my knees are red and swollen and not just on the top. Its my entire kneecaps,  both right and left. The last thing I need is to get the hip replacement only to discover I've ruined my knees. Please call me. Valerie Delgado  [email or web address]  [phone]    ______________________________ I called patient and left voicemail message. I am providing Amy with a surgery date to work on new authorization since patient has completed the requirement which was the basis for denial in the original request for surgery.

## 2019-11-28 NOTE — Telephone Encounter (Signed)
I called. Already had knee xrays in June with some arthritis both knees. I left her voicemail. I am not aware if insurance approved yet. FYI

## 2019-12-03 NOTE — Progress Notes (Deleted)
Office Visit Note  Patient: Valerie Delgado             Date of Birth: 06/15/1965           MRN: [id number]             PCP: Glenis Smoker, MD Referring: Glenis Smoker, * Visit Date: 12/17/2019 Occupation: @GUAROCC @  Subjective:  No chief complaint on file.   History of Present Illness: Valerie Delgado is a 54 y.o. female ***   Activities of Daily Living:  Patient reports morning stiffness for *** {minute/hour:19697}.   Patient {ACTIONS;DENIES/REPORTS:21021675::"Denies"} nocturnal pain.  Difficulty dressing/grooming: {ACTIONS;DENIES/REPORTS:21021675::"Denies"} Difficulty climbing stairs: {ACTIONS;DENIES/REPORTS:21021675::"Denies"} Difficulty getting out of chair: {ACTIONS;DENIES/REPORTS:21021675::"Denies"} Difficulty using hands for taps, buttons, cutlery, and/or writing: {ACTIONS;DENIES/REPORTS:21021675::"Denies"}  No Rheumatology ROS completed.   PMFS History:  Patient Active Problem List   Diagnosis Date Noted  . Unilateral primary osteoarthritis, right hip 09/11/2019  . Decreased visual acuity 03/30/2017  . Estrogen deficiency 03/30/2017  . Inflammatory arthritis 11/13/2016  . Essential hypertension 09/19/2016  . History of anxiety and depression 09/19/2016  . History of Meniere's disease 09/19/2016  . History of hearing loss 09/19/2016  . History of ADHD 09/19/2016  . Dyspareunia, female 01/11/2016  . Routine general medical examination at a health care facility 01/11/2016  . Smoker 09/01/2015  . Anxiety and depression 09/01/2015  . Positive QuantiFERON-TB Gold test 09/01/2015  . History of herpes simplex infection 09/01/2015    Past Medical History:  Diagnosis Date  . Allergy   . Anxiety   . Arthritis   . Depression   . Hypertension   . Meniere's disease of left ear   . Psoriatic arthritis ([place])    Dr. Estanislado Pandy  . Tuberculosis    started treatment on 08/30/2015    Family History  Problem Relation Age of Onset  . Hypertension Mother   .  Cancer Mother        breast  . Arthritis Mother   . Depression Mother   . Hypertension Father   . Hypertension Brother   . Stroke Brother   . Colon cancer Neg Hx   . Esophageal cancer Neg Hx   . Rectal cancer Neg Hx   . Stomach cancer Neg Hx    Past Surgical History:  Procedure Laterality Date  . CESAREAN SECTION    . MOUTH SURGERY  2020   tooth extraction/ bone graft    Social History   Social History Narrative  . Not on file   Immunization History  Administered Date(s) Administered  . Influenza,inj,Quad PF,6+ Mos 01/11/2016, 03/30/2017  . Influenza-Unspecified 01/23/2018  . Tdap 08/21/2014     Objective: Vital Signs: There were no vitals taken for this visit.   Physical Exam   Musculoskeletal Exam: ***  CDAI Exam: CDAI Score: -- Patient Global: --; Provider Global: -- Swollen: --; Tender: -- Joint Exam 12/17/2019   No joint exam has been documented for this visit   There is currently no information documented on the homunculus. Go to the Rheumatology activity and complete the homunculus joint exam.  Investigation: No additional findings.  Imaging: No results found.  Recent Labs: Lab Results  Component Value Date   WBC 12.1 (H) 07/29/2019   HGB 15.6 (H) 07/29/2019   PLT 427 (H) 07/29/2019   NA 137 07/29/2019   K 3.9 07/29/2019   CL 96 (L) 07/29/2019   CO2 30 07/29/2019   GLUCOSE 98 07/29/2019   BUN 15 07/29/2019   CREATININE 0.70 07/29/2019  BILITOT 0.4 07/29/2019   ALKPHOS 65 03/30/2017   AST 15 07/29/2019   ALT 12 07/29/2019   PROT 7.3 07/29/2019   PROT 7.4 07/29/2019   ALBUMIN 4.2 03/30/2017   CALCIUM 10.0 07/29/2019   GFRAA 112 07/29/2019    Speciality Comments: No specialty comments available.  Procedures:  No procedures performed Allergies: Sulfur   Assessment / Plan:     Visit Diagnoses: No diagnosis found.  Orders: No orders of the defined types were placed in this encounter.  No orders of the defined types were placed  in this encounter.   Face-to-face time spent with patient was *** minutes. Greater than 50% of time was spent in counseling and coordination of care.  Follow-Up Instructions: No follow-ups on file.   Earnestine Mealing, CMA  Note - This record has been created using Editor, commissioning.  Chart creation errors have been sought, but may not always  have been located. Such creation errors do not reflect on  the standard of medical care.

## 2019-12-05 ENCOUNTER — Telehealth: Payer: Self-pay

## 2019-12-05 NOTE — Telephone Encounter (Addendum)
[facility] denied pts surgery. Reasoning: "no clinical notes that you have had at least 3 months of conservative treatment. There was no office note from a primary care physician within the 6 weeks prior to the indication of surgery"  Please note that I faxed all office notes from 2018 to present, imaging reports, and her recent nicotine lab report to them when I faxed in the [facility] form.  I went ahead and set up a P2P since per their protocol it has to be set up within 5 days of the denial. It is scheduled for either 12/09/19 or 12/10/19 @ 10:00 (had to give 2 possible times). Gave them your cell # and the triage # as back up.

## 2019-12-10 NOTE — Telephone Encounter (Signed)
HAD P2P , they will let us know, wanted to know if see by PCP in last 6 wks , was seen 09/26/19 by PCP, also had done exercise program instructed by me.  Had all other requirements.    They will let us know thanks.

## 2019-12-12 NOTE — Progress Notes (Addendum)
[id number] [address], [place] [address] Phone: [phone] Fax: [phone]  [address] ([place], [place] [address] Phone: [phone] Fax: [phone]      Your procedure is scheduled on Wednesday, October 27th.  Report to [facility] Main Entrance "A" at 10:30 A.M., and check in at the Admitting office.  Call this number if you have problems the morning of surgery:  [phone]  Call [phone] if you have any questions prior to your surgery date Monday-Friday 8am-4pm    Remember:  Do not eat after midnight the night before your surgery  You may drink clear liquids until 9:30 AM the morning of your surgery.   Clear liquids allowed are: Water, Non-Citrus Juices (without pulp), Carbonated Beverages, Clear Tea, Black Coffee Only, and Gatorade    Take these medicines the morning of surgery with A SIP OF WATER   Alprazolam (Xanax)  Duloxetin (Cymbalta)    As of today, STOP taking any Aspirin (unless otherwise instructed by your surgeon) Aleve, Naproxen, Ibuprofen, Motrin, Advil, Goody's, BC's, all herbal medications, fish oil, and all vitamins.                      Do not wear jewelry, make up, or nail polish            Do not wear lotions, powders, perfumes, or deodorant.            Do not shave 48 hours prior to surgery.              Do not bring valuables to the hospital.            [facility] is not responsible for any belongings or valuables.  Do NOT Smoke (Tobacco/Vaping) or drink Alcohol 24 hours prior to your procedure If you use a CPAP at night, you may bring all equipment for your overnight stay.   Contacts, glasses, dentures or bridgework may not be worn into surgery.      For patients admitted to the hospital, discharge time will be determined by your treatment team.    Patients discharged the day of surgery will not be allowed to drive home, and someone needs to stay with them for 24 hours.    Special instructions:   [facility]- Preparing For Surgery  Before surgery, you can play an important role. Because skin is not sterile, your skin needs to be as free of germs as possible. You can reduce the number of germs on your skin by washing with CHG (chlorahexidine gluconate) Soap before surgery.  CHG is an antiseptic cleaner which kills germs and bonds with the skin to continue killing germs even after washing.    Oral Hygiene is also important to reduce your risk of infection.  Remember - BRUSH YOUR TEETH THE MORNING OF SURGERY WITH YOUR REGULAR TOOTHPASTE  Please do not use if you have an allergy to CHG or antibacterial soaps. If your skin becomes reddened/irritated stop using the CHG.  Do not shave (including legs and underarms) for at least 48 hours prior to first CHG shower. It is OK to shave your face.  Please follow these instructions carefully.   1. Shower the NIGHT BEFORE SURGERY and the MORNING OF SURGERY with CHG Soap.   2. If you chose to  wash your hair, wash your hair first as usual with your normal shampoo.  3. After you shampoo, rinse your hair and body thoroughly to remove the shampoo.  4. Use CHG as you would any other liquid soap. You can apply CHG directly to the skin and wash gently with a scrungie or a clean washcloth.   5. Apply the CHG Soap to your body ONLY FROM THE NECK DOWN.  Do not use on open wounds or open sores. Avoid contact with your eyes, ears, mouth and genitals (private parts). Wash Face and genitals (private parts)  with your normal soap.   6. Wash thoroughly, paying special attention to the area where your surgery will be performed.  7. Thoroughly rinse your body with warm water from the neck down.  8. DO NOT shower/wash with your normal soap after using and rinsing off the CHG Soap.  9. Pat yourself dry with a  CLEAN TOWEL.  10. Wear CLEAN PAJAMAS to bed the night before surgery  11. Place CLEAN SHEETS on your bed the night of your first shower and DO NOT SLEEP WITH PETS.   Day of Surgery: Wear Clean/Comfortable clothing the morning of surgery Do not apply any deodorants/lotions.   Remember to brush your teeth WITH YOUR REGULAR TOOTHPASTE.   Please read over the following fact sheets that you were given.

## 2019-12-15 ENCOUNTER — Other Ambulatory Visit (HOSPITAL_COMMUNITY)
Admission: RE | Admit: 2019-12-15 | Discharge: 2019-12-15 | Disposition: A | Discharge status: Home / Self Care | Payer: 59 | Source: Ambulatory Visit | Attending: Orthopaedic Surgery | Admitting: Orthopaedic Surgery

## 2019-12-15 ENCOUNTER — Encounter: Payer: Self-pay | Admitting: Orthopaedic Surgery

## 2019-12-15 ENCOUNTER — Encounter (HOSPITAL_COMMUNITY)
Admission: RE | Admit: 2019-12-15 | Discharge: 2019-12-15 | Disposition: A | Discharge status: Home / Self Care | Payer: 59 | Source: Ambulatory Visit | Attending: Orthopaedic Surgery | Admitting: Orthopaedic Surgery

## 2019-12-15 ENCOUNTER — Other Ambulatory Visit: Payer: Self-pay

## 2019-12-15 ENCOUNTER — Encounter (HOSPITAL_COMMUNITY): Payer: Self-pay

## 2019-12-15 DIAGNOSIS — Z20822 Contact with and (suspected) exposure to covid-19: Secondary | ICD-10-CM | POA: Insufficient documentation

## 2019-12-15 DIAGNOSIS — Z01812 Encounter for preprocedural laboratory examination: Secondary | ICD-10-CM | POA: Insufficient documentation

## 2019-12-15 DIAGNOSIS — Z01818 Encounter for other preprocedural examination: Secondary | ICD-10-CM | POA: Insufficient documentation

## 2019-12-15 DIAGNOSIS — I10 Essential (primary) hypertension: Secondary | ICD-10-CM | POA: Diagnosis not present

## 2019-12-15 LAB — COMPREHENSIVE METABOLIC PANEL
ALT: 18 U/L (ref 0–44)
AST: 17 U/L (ref 15–41)
Albumin: 4.3 g/dL (ref 3.5–5.0)
Alkaline Phosphatase: 68 U/L (ref 38–126)
Anion gap: 14 (ref 5–15)
BUN: 18 mg/dL (ref 6–20)
CO2: 24 mmol/L (ref 22–32)
Calcium: 10.3 mg/dL (ref 8.9–10.3)
Chloride: 96 mmol/L — ABNORMAL LOW (ref 98–111)
Creatinine, Ser: 0.89 mg/dL (ref 0.44–1.00)
GFR, Estimated: >60 mL/min (ref >60)
Glucose, Bld: 93 mg/dL (ref 70–99)
Potassium: 3.3 mmol/L — ABNORMAL LOW (ref 3.5–5.1)
Sodium: 134 mmol/L — ABNORMAL LOW (ref 135–145)
Total Bilirubin: 0.9 mg/dL (ref 0.3–1.2)
Total Protein: 8 g/dL (ref 6.5–8.1)

## 2019-12-15 LAB — URINALYSIS, ROUTINE W REFLEX MICROSCOPIC
Bilirubin Urine: NEGATIVE
Glucose, UA: NEGATIVE mg/dL
Hgb urine dipstick: NEGATIVE
Ketones, ur: NEGATIVE mg/dL
Leukocytes,Ua: NEGATIVE
Nitrite: NEGATIVE
Protein, ur: 30 mg/dL — AB
Specific Gravity, Urine: 1.013 (ref 1.005–1.030)
pH: 5 (ref 5.0–8.0)

## 2019-12-15 LAB — PROTIME-INR
INR: 1 (ref 0.8–1.2)
Prothrombin Time: 12.6 seconds (ref 11.4–15.2)

## 2019-12-15 LAB — CBC
HCT: 47.2 % — ABNORMAL HIGH (ref 36.0–46.0)
Hemoglobin: 15.7 g/dL — ABNORMAL HIGH (ref 12.0–15.0)
MCH: 31.3 pg (ref 26.0–34.0)
MCHC: 33.3 g/dL (ref 30.0–36.0)
MCV: 94 fL (ref 80.0–100.0)
Platelets: 471 10*3/uL — ABNORMAL HIGH (ref 150–400)
RBC: 5.02 MIL/uL (ref 3.87–5.11)
RDW: 12.9 % (ref 11.5–15.5)
WBC: 11.1 10*3/uL — ABNORMAL HIGH (ref 4.0–10.5)
nRBC: 0 % (ref 0.0–0.2)

## 2019-12-15 LAB — SURGICAL PCR SCREEN
MRSA, PCR: NEGATIVE
Staphylococcus aureus: NEGATIVE

## 2019-12-15 LAB — SARS CORONAVIRUS 2 (TAT 6-24 HRS): SARS Coronavirus 2: NEGATIVE

## 2019-12-15 NOTE — Progress Notes (Addendum)
PCP - Sela Hilding  Chest x-ray - n/a EKG - 12-15-19  ERAS Protcol - yes, no drink ordered or given    COVID TEST- Monday 12-15-19   Anesthesia review: yes, EKG   Patient denies shortness of breath, fever, cough and chest pain at PAT appointment   All instructions explained to the patient, with a verbal understanding of the material. Patient agrees to go over the instructions while at home for a better understanding. Patient also instructed to self quarantine after being tested for COVID-19. The opportunity to ask questions was provided.

## 2019-12-17 ENCOUNTER — Ambulatory Visit: Payer: 59 | Admitting: Physician Assistant

## 2019-12-17 ENCOUNTER — Encounter (HOSPITAL_COMMUNITY): Admission: RE | Payer: Self-pay | Source: Ambulatory Visit

## 2019-12-17 ENCOUNTER — Ambulatory Visit (HOSPITAL_COMMUNITY): Admission: RE | Admit: 2019-12-17 | Payer: 59 | Source: Ambulatory Visit | Admitting: Orthopaedic Surgery

## 2019-12-17 DIAGNOSIS — M1611 Unilateral primary osteoarthritis, right hip: Secondary | ICD-10-CM | POA: Diagnosis present

## 2019-12-17 DIAGNOSIS — M5136 Other intervertebral disc degeneration, lumbar region: Secondary | ICD-10-CM

## 2019-12-17 DIAGNOSIS — Z8659 Personal history of other mental and behavioral disorders: Secondary | ICD-10-CM

## 2019-12-17 DIAGNOSIS — I10 Essential (primary) hypertension: Secondary | ICD-10-CM

## 2019-12-17 DIAGNOSIS — Z8619 Personal history of other infectious and parasitic diseases: Secondary | ICD-10-CM

## 2019-12-17 DIAGNOSIS — Z8669 Personal history of other diseases of the nervous system and sense organs: Secondary | ICD-10-CM

## 2019-12-17 DIAGNOSIS — M170 Bilateral primary osteoarthritis of knee: Secondary | ICD-10-CM

## 2019-12-17 DIAGNOSIS — Z79899 Other long term (current) drug therapy: Secondary | ICD-10-CM

## 2019-12-17 DIAGNOSIS — L4050 Arthropathic psoriasis, unspecified: Secondary | ICD-10-CM

## 2019-12-17 DIAGNOSIS — R7612 Nonspecific reaction to cell mediated immunity measurement of gamma interferon antigen response without active tuberculosis: Secondary | ICD-10-CM

## 2019-12-17 DIAGNOSIS — M19071 Primary osteoarthritis, right ankle and foot: Secondary | ICD-10-CM

## 2019-12-17 DIAGNOSIS — R768 Other specified abnormal immunological findings in serum: Secondary | ICD-10-CM

## 2019-12-17 SURGERY — ARTHROPLASTY, HIP, TOTAL, ANTERIOR APPROACH
Anesthesia: Spinal | Site: Hip | Laterality: Right

## 2019-12-25 ENCOUNTER — Encounter: Payer: Self-pay | Admitting: Orthopaedic Surgery

## 2020-01-02 ENCOUNTER — Telehealth: Payer: Self-pay

## 2020-01-02 NOTE — Telephone Encounter (Signed)
Would this be for surgery?

## 2020-01-02 NOTE — Telephone Encounter (Signed)
[place] from [facility] called stating patient  Authorization was approved.

## 2020-01-05 ENCOUNTER — Other Ambulatory Visit: Payer: Self-pay

## 2020-01-05 NOTE — Telephone Encounter (Signed)
Looks like this is for you.  Thanks!

## 2020-01-08 ENCOUNTER — Ambulatory Visit (INDEPENDENT_AMBULATORY_CARE_PROVIDER_SITE_OTHER): Payer: 59 | Admitting: Surgery

## 2020-01-08 ENCOUNTER — Encounter: Payer: Self-pay | Admitting: Surgery

## 2020-01-08 VITALS — BP 139/89 | HR 105 | Ht 66.0 in | Wt 160.6 lb

## 2020-01-08 DIAGNOSIS — M1611 Unilateral primary osteoarthritis, right hip: Secondary | ICD-10-CM

## 2020-01-08 NOTE — Progress Notes (Signed)
54 year old white female history of end-stage DJD right hip and pain comes in for preop evaluation.  States that hip symptoms unchanged in previous visit and she is want to proceed with total hip replacement as scheduled.  Today history and physical performed.  Review of systems negative.  Surgical procedure discussed.  Advise she can anticipate out of work at least 10 to 12 weeks postop.  All questions answered.

## 2020-01-14 NOTE — Pre-Procedure Instructions (Addendum)
Your procedure is scheduled on Mon., Nov. 29, 2021 from 12:15PM-2:33PM  Report to [facility] Entrance "A" at 10:15AM  Call this number if you have problems the morning of surgery:  [phone]   Remember:  Do not eat after midnight on Nov. 28th  You may drink clear liquids until 3 hours (9:15AM) prior to surgery time.  Clear liquids allowed are: Water, Non- Citric Juice (no pulp), Black Coffee Only (no dairy or creamer), Clear Tea (no dairy or creamer), Carbonated Beverages, Gatorade, Plain Jell-O, Plain Popsicles.  Please complete your PRE-SURGERY ENSURE that was provided to you by 9:15AM the morning of surgery.  Please, if able, drink it in one setting. DO NOT SIP.     Take these medicines the morning of surgery with A SIP OF WATER: AmLODipine (NORVASC)     DULoxetine (CYMBALTA)      If Needed:  ALPRAZolam Duanne Moron) TraMADol (ULTRAM)  ValACYclovir (VALTREX)  As of today, STOP taking all Aspirin (unless instructed by your doctor) and Other Aspirin containing products, Vitamins, Fish oils, and Herbal medications. Also stop all NSAIDS i.e. Advil, Ibuprofen, Motrin, Aleve, Anaprox, Naproxen, BC, Goody Powders, and all Supplements. Including: Pseudoephedrine (SUDAFED)    No Smoking of any kind, Tobacco/Vaping, or Alcohol products 24 hours prior to your procedure. If you use a Cpap at night, you may bring all equipment for your overnight stay.   Special instructions:  [facility]- Preparing For Surgery  Before surgery, you can play an important role. Because skin is not sterile, your skin needs to be as free of germs as possible. You can reduce the number of germs on your skin by washing with CHG (chlorahexidine gluconate) Soap before surgery.  CHG is an antiseptic cleaner which kills germs and bonds with the skin to continue killing germs even after washing.    Please do not use if you have an allergy to CHG or antibacterial soaps. If your skin becomes reddened/irritated stop  using the CHG.  Do not shave (including legs and underarms) for at least 48 hours prior to first CHG shower. It is OK to shave your face.  Please follow these instructions carefully.   1. Shower the NIGHT BEFORE SURGERY and the MORNING OF SURGERY with CHG.   2. If you chose to wash your hair, wash your hair first as usual with your normal shampoo.  3. After you shampoo, rinse your hair and body thoroughly to remove the shampoo.  4. Use CHG as you would any other liquid soap. You can apply CHG directly to the skin and wash gently with a scrungie or a clean washcloth.   5. Apply the CHG Soap to your body ONLY FROM THE NECK DOWN.  Do not use on open wounds or open sores. Avoid contact with your eyes, ears, mouth and genitals (private parts). Wash Face and genitals (private parts)  with your normal soap.  6. Wash thoroughly, paying special attention to the area where your surgery will be performed.  7. Thoroughly rinse your body with warm water from the neck down.  8. DO NOT shower/wash with your normal soap after using and rinsing off the CHG Soap.  9. Pat yourself dry with a CLEAN TOWEL.  10. Wear CLEAN PAJAMAS to bed the night before surgery, wear comfortable clothes the morning of surgery  11. Place CLEAN SHEETS on your bed the night of your first shower and DO NOT SLEEP WITH PETS.   Day of Surgery:  Remember to brush your teeth WITH YOUR REGULAR TOOTHPASTE.  Do not wear jewelry, make-up or nail polish.  Do not wear lotions, powders, or perfumes, or deodorant.  Do not shave 48 hours prior to surgery.    Do not bring valuables to the hospital.  [facility] is not responsible for any belongings or valuables.  Contacts, dentures or bridgework may not be worn into surgery.    For patients admitted to the hospital, discharge time will be determined by your treatment team.  Patients discharged the day of surgery will not be allowed to drive home, and someone age 58 and  over needs to stay with them for 24 hours.   Please wear clean clothes to the hospital/surgery center.    Please read over the following fact sheets that you were given.

## 2020-01-16 ENCOUNTER — Encounter (HOSPITAL_COMMUNITY)
Admission: RE | Admit: 2020-01-16 | Discharge: 2020-01-16 | Disposition: A | Discharge status: Home / Self Care | Payer: 59 | Source: Ambulatory Visit | Attending: Orthopaedic Surgery | Admitting: Orthopaedic Surgery

## 2020-01-16 ENCOUNTER — Other Ambulatory Visit (HOSPITAL_COMMUNITY)
Admission: RE | Admit: 2020-01-16 | Discharge: 2020-01-16 | Disposition: A | Discharge status: Home / Self Care | Payer: 59 | Source: Ambulatory Visit | Attending: Orthopaedic Surgery | Admitting: Orthopaedic Surgery

## 2020-01-16 ENCOUNTER — Encounter (HOSPITAL_COMMUNITY): Payer: Self-pay

## 2020-01-16 ENCOUNTER — Other Ambulatory Visit: Payer: Self-pay

## 2020-01-16 DIAGNOSIS — Z20822 Contact with and (suspected) exposure to covid-19: Secondary | ICD-10-CM | POA: Diagnosis not present

## 2020-01-16 DIAGNOSIS — Z01818 Encounter for other preprocedural examination: Secondary | ICD-10-CM | POA: Insufficient documentation

## 2020-01-16 LAB — CBC
HCT: 44.1 % (ref 36.0–46.0)
Hemoglobin: 14.2 g/dL (ref 12.0–15.0)
MCH: 30 pg (ref 26.0–34.0)
MCHC: 32.2 g/dL (ref 30.0–36.0)
MCV: 93 fL (ref 80.0–100.0)
Platelets: 376 10*3/uL (ref 150–400)
RBC: 4.74 MIL/uL (ref 3.87–5.11)
RDW: 13.2 % (ref 11.5–15.5)
WBC: 9.1 10*3/uL (ref 4.0–10.5)
nRBC: 0 % (ref 0.0–0.2)

## 2020-01-16 LAB — SURGICAL PCR SCREEN
MRSA, PCR: NEGATIVE
Staphylococcus aureus: NEGATIVE

## 2020-01-16 LAB — BASIC METABOLIC PANEL
Anion gap: 9 (ref 5–15)
BUN: 12 mg/dL (ref 6–20)
CO2: 28 mmol/L (ref 22–32)
Calcium: 9.4 mg/dL (ref 8.9–10.3)
Chloride: 101 mmol/L (ref 98–111)
Creatinine, Ser: 0.65 mg/dL (ref 0.44–1.00)
GFR, Estimated: >60 mL/min (ref >60)
Glucose, Bld: 110 mg/dL — ABNORMAL HIGH (ref 70–99)
Potassium: 3.9 mmol/L (ref 3.5–5.1)
Sodium: 138 mmol/L (ref 135–145)

## 2020-01-16 LAB — SARS CORONAVIRUS 2 (TAT 6-24 HRS): SARS Coronavirus 2: NEGATIVE

## 2020-01-16 MED ORDER — BUPIVACAINE LIPOSOME 1.3 % IJ SUSP
20.0000 mL | Freq: Once | INTRAMUSCULAR | Status: DC
  Filled 2020-01-16: qty 20

## 2020-01-16 NOTE — Progress Notes (Signed)
PCP - Dr. Lindalou Hose at [place] Cardiologist - denies  Chest x-ray - 08/02/19 EKG - 12/15/19 Stress Test - Denies ECHO - denies Cardiac Cath - Denies  Sleep Study - Denies  DM - denies  ERAS Protcol -Yes PRE-SURGERY Ensure given   COVID TEST- 01/16/20  Anesthesia review: No  Patient denies shortness of breath, fever, cough and chest pain at PAT appointment   All instructions explained to the patient, with a verbal understanding of the material. Patient agrees to go over the instructions while at home for a better understanding. Patient also instructed to self quarantine after being tested for COVID-19. The opportunity to ask questions was provided.

## 2020-01-19 ENCOUNTER — Observation Stay (HOSPITAL_COMMUNITY)
Admission: RE | Admit: 2020-01-19 | Discharge: 2020-01-20 | Disposition: A | Discharge status: Home / Self Care | Payer: 59 | Attending: Orthopaedic Surgery | Admitting: Orthopaedic Surgery

## 2020-01-19 ENCOUNTER — Other Ambulatory Visit: Payer: Self-pay

## 2020-01-19 ENCOUNTER — Ambulatory Visit (HOSPITAL_COMMUNITY): Payer: 59 | Admitting: Vascular Surgery

## 2020-01-19 ENCOUNTER — Encounter (HOSPITAL_COMMUNITY)
Admission: RE | Disposition: A | Discharge status: Home / Self Care | Payer: Self-pay | Source: Home / Self Care | Attending: Orthopaedic Surgery

## 2020-01-19 ENCOUNTER — Ambulatory Visit (HOSPITAL_COMMUNITY): Payer: 59

## 2020-01-19 ENCOUNTER — Ambulatory Visit (HOSPITAL_COMMUNITY): Payer: 59 | Admitting: Anesthesiology

## 2020-01-19 ENCOUNTER — Encounter (HOSPITAL_COMMUNITY): Payer: Self-pay | Admitting: Orthopaedic Surgery

## 2020-01-19 DIAGNOSIS — Z87891 Personal history of nicotine dependence: Secondary | ICD-10-CM | POA: Insufficient documentation

## 2020-01-19 DIAGNOSIS — Z79899 Other long term (current) drug therapy: Secondary | ICD-10-CM | POA: Insufficient documentation

## 2020-01-19 DIAGNOSIS — M1611 Unilateral primary osteoarthritis, right hip: Principal | ICD-10-CM | POA: Insufficient documentation

## 2020-01-19 DIAGNOSIS — Z419 Encounter for procedure for purposes other than remedying health state, unspecified: Secondary | ICD-10-CM

## 2020-01-19 DIAGNOSIS — I10 Essential (primary) hypertension: Secondary | ICD-10-CM | POA: Diagnosis not present

## 2020-01-19 DIAGNOSIS — M25551 Pain in right hip: Secondary | ICD-10-CM | POA: Diagnosis present

## 2020-01-19 DIAGNOSIS — Z96641 Presence of right artificial hip joint: Secondary | ICD-10-CM

## 2020-01-19 HISTORY — PX: TOTAL HIP ARTHROPLASTY: SHX124

## 2020-01-19 LAB — GLUCOSE, CAPILLARY: Glucose-Capillary: 214 mg/dL — ABNORMAL HIGH (ref 70–99)

## 2020-01-19 SURGERY — ARTHROPLASTY, HIP, TOTAL, ANTERIOR APPROACH
Anesthesia: General | Site: Hip | Laterality: Right

## 2020-01-19 MED ORDER — PHENYLEPHRINE 40 MCG/ML (10ML) SYRINGE FOR IV PUSH (FOR BLOOD PRESSURE SUPPORT)
PREFILLED_SYRINGE | INTRAVENOUS | Status: DC | PRN
  Administered 2020-01-19: 120 ug via INTRAVENOUS

## 2020-01-19 MED ORDER — STERILE WATER FOR IRRIGATION IR SOLN
Status: DC | PRN
  Administered 2020-01-19: 1000 mL

## 2020-01-19 MED ORDER — ORAL CARE MOUTH RINSE: 15.0000 mL | Freq: Once | OROMUCOSAL | Status: AC

## 2020-01-19 MED ORDER — ALPRAZOLAM 0.5 MG PO TABS
1.0000 mg | ORAL_TABLET | Freq: Two times a day (BID) | ORAL | Status: DC | PRN
  Administered 2020-01-19: 1 mg via ORAL
  Filled 2020-01-19: qty 1

## 2020-01-19 MED ORDER — VALACYCLOVIR HCL 500 MG PO TABS
500.0000 mg | ORAL_TABLET | Freq: Every day | ORAL | Status: DC | PRN
  Filled 2020-01-19: qty 1

## 2020-01-19 MED ORDER — HYDROCODONE-ACETAMINOPHEN 5-325 MG PO TABS: 1.0000 | ORAL_TABLET | ORAL | Status: DC | PRN

## 2020-01-19 MED ORDER — BUPIVACAINE LIPOSOME 1.3 % IJ SUSP
20.0000 mL | INTRAMUSCULAR | Status: AC
  Administered 2020-01-19: 10 mL
  Filled 2020-01-19: qty 20

## 2020-01-19 MED ORDER — CHLORHEXIDINE GLUCONATE 0.12 % MT SOLN
OROMUCOSAL | Status: AC
  Administered 2020-01-19: 15 mL via OROMUCOSAL
  Filled 2020-01-19: qty 15

## 2020-01-19 MED ORDER — DOCUSATE SODIUM 100 MG PO CAPS
100.0000 mg | ORAL_CAPSULE | Freq: Two times a day (BID) | ORAL | Status: DC
  Administered 2020-01-19 – 2020-01-20 (×2): 100 mg via ORAL
  Filled 2020-01-19 (×2): qty 1

## 2020-01-19 MED ORDER — MIDAZOLAM HCL 2 MG/2ML IJ SOLN
INTRAMUSCULAR | Status: AC
  Filled 2020-01-19: qty 2

## 2020-01-19 MED ORDER — BUPIVACAINE HCL (PF) 0.5 % IJ SOLN
INTRAMUSCULAR | Status: AC
  Filled 2020-01-19: qty 30

## 2020-01-19 MED ORDER — METOCLOPRAMIDE HCL 5 MG/ML IJ SOLN: 5.0000 mg | Freq: Three times a day (TID) | INTRAMUSCULAR | Status: DC | PRN

## 2020-01-19 MED ORDER — BISACODYL 10 MG RE SUPP: 10.0000 mg | Freq: Every day | RECTAL | Status: DC | PRN

## 2020-01-19 MED ORDER — TRAMADOL HCL 50 MG PO TABS
50.0000 mg | ORAL_TABLET | Freq: Four times a day (QID) | ORAL | Status: DC
  Administered 2020-01-19 – 2020-01-20 (×3): 50 mg via ORAL
  Filled 2020-01-19 (×2): qty 1

## 2020-01-19 MED ORDER — ONDANSETRON HCL 4 MG/2ML IJ SOLN
INTRAMUSCULAR | Status: DC | PRN
  Administered 2020-01-19: 4 mg via INTRAVENOUS

## 2020-01-19 MED ORDER — PHENOL 1.4 % MT LIQD: 1.0000 | OROMUCOSAL | Status: DC | PRN

## 2020-01-19 MED ORDER — ACETAMINOPHEN 10 MG/ML IV SOLN
INTRAVENOUS | Status: AC
  Filled 2020-01-19: qty 100

## 2020-01-19 MED ORDER — ADULT MULTIVITAMIN W/MINERALS CH
1.0000 | ORAL_TABLET | Freq: Every day | ORAL | Status: DC
  Administered 2020-01-20: 1 via ORAL
  Filled 2020-01-19: qty 1

## 2020-01-19 MED ORDER — PROPOFOL 10 MG/ML IV BOLUS
INTRAVENOUS | Status: DC | PRN
  Administered 2020-01-19: 170 mg via INTRAVENOUS

## 2020-01-19 MED ORDER — METOCLOPRAMIDE HCL 5 MG PO TABS: 5.0000 mg | ORAL_TABLET | Freq: Three times a day (TID) | ORAL | Status: DC | PRN

## 2020-01-19 MED ORDER — AMPHETAMINE-DEXTROAMPHETAMINE 10 MG PO TABS: 30.0000 mg | ORAL_TABLET | Freq: Two times a day (BID) | ORAL | Status: DC | PRN

## 2020-01-19 MED ORDER — LIDOCAINE 2% (20 MG/ML) 5 ML SYRINGE
INTRAMUSCULAR | Status: DC | PRN
  Administered 2020-01-19: 100 mg via INTRAVENOUS

## 2020-01-19 MED ORDER — SECUKINUMAB (300 MG DOSE) 150 MG/ML ~~LOC~~ SOAJ
300.0000 mg | SUBCUTANEOUS | Status: DC
  Administered 2020-01-20: 300 mg via INTRAMUSCULAR

## 2020-01-19 MED ORDER — MIDAZOLAM HCL 5 MG/5ML IJ SOLN
INTRAMUSCULAR | Status: DC | PRN
  Administered 2020-01-19: 2 mg via INTRAVENOUS

## 2020-01-19 MED ORDER — HYDROCODONE-ACETAMINOPHEN 7.5-325 MG PO TABS
1.0000 | ORAL_TABLET | ORAL | Status: DC | PRN
  Administered 2020-01-20: 1 via ORAL
  Filled 2020-01-19: qty 1

## 2020-01-19 MED ORDER — ONDANSETRON HCL 4 MG PO TABS: 4.0000 mg | ORAL_TABLET | Freq: Four times a day (QID) | ORAL | Status: DC | PRN

## 2020-01-19 MED ORDER — SUGAMMADEX SODIUM 200 MG/2ML IV SOLN
INTRAVENOUS | Status: DC | PRN
  Administered 2020-01-19: 50 mg via INTRAVENOUS
  Administered 2020-01-19: 5 mg via INTRAVENOUS
  Administered 2020-01-19 (×2): 50 mg via INTRAVENOUS

## 2020-01-19 MED ORDER — 0.9 % SODIUM CHLORIDE (POUR BTL) OPTIME
TOPICAL | Status: DC | PRN
  Administered 2020-01-19: 1000 mL

## 2020-01-19 MED ORDER — MENTHOL 3 MG MT LOZG: 1.0000 | LOZENGE | OROMUCOSAL | Status: DC | PRN

## 2020-01-19 MED ORDER — PROPOFOL 10 MG/ML IV BOLUS
INTRAVENOUS | Status: AC
  Filled 2020-01-19: qty 20

## 2020-01-19 MED ORDER — HYDROMORPHONE HCL 1 MG/ML IJ SOLN: 0.2500 mg | INTRAMUSCULAR | Status: DC | PRN

## 2020-01-19 MED ORDER — FENTANYL CITRATE (PF) 250 MCG/5ML IJ SOLN
INTRAMUSCULAR | Status: DC | PRN
  Administered 2020-01-19: 50 ug via INTRAVENOUS
  Administered 2020-01-19 (×2): 100 ug via INTRAVENOUS

## 2020-01-19 MED ORDER — AMPHETAMINE-DEXTROAMPHETAMINE 30 MG PO TABS: 30.0000 mg | ORAL_TABLET | Freq: Two times a day (BID) | ORAL | Status: DC | PRN

## 2020-01-19 MED ORDER — TRAMADOL HCL 50 MG PO TABS
ORAL_TABLET | ORAL | Status: AC
  Filled 2020-01-19: qty 1

## 2020-01-19 MED ORDER — ACETAMINOPHEN 325 MG PO TABS: 325.0000 mg | ORAL_TABLET | Freq: Four times a day (QID) | ORAL | Status: DC | PRN

## 2020-01-19 MED ORDER — FENTANYL CITRATE (PF) 250 MCG/5ML IJ SOLN
INTRAMUSCULAR | Status: AC
  Filled 2020-01-19: qty 5

## 2020-01-19 MED ORDER — POVIDONE-IODINE 10 % EX SWAB: 2.0000 "application " | Freq: Once | CUTANEOUS | Status: DC

## 2020-01-19 MED ORDER — CEFAZOLIN SODIUM-DEXTROSE 2-4 GM/100ML-% IV SOLN
2.0000 g | INTRAVENOUS | Status: AC
  Administered 2020-01-19: 2 g via INTRAVENOUS

## 2020-01-19 MED ORDER — LACTATED RINGERS IV SOLN: INTRAVENOUS | Status: DC | PRN

## 2020-01-19 MED ORDER — AMLODIPINE BESYLATE 10 MG PO TABS
10.0000 mg | ORAL_TABLET | Freq: Every day | ORAL | Status: DC
  Administered 2020-01-20: 10 mg via ORAL
  Filled 2020-01-19: qty 1

## 2020-01-19 MED ORDER — ACETAMINOPHEN 10 MG/ML IV SOLN
INTRAVENOUS | Status: DC | PRN
  Administered 2020-01-19: 1000 mg via INTRAVENOUS

## 2020-01-19 MED ORDER — HYDROMORPHONE HCL 1 MG/ML IJ SOLN
INTRAMUSCULAR | Status: DC | PRN
Start: 2020-01-19 — End: 2020-01-19
  Administered 2020-01-19 (×2): .5 mg via INTRAVENOUS

## 2020-01-19 MED ORDER — DULOXETINE HCL 60 MG PO CPEP
120.0000 mg | ORAL_CAPSULE | Freq: Every day | ORAL | Status: DC
  Administered 2020-01-20: 120 mg via ORAL
  Filled 2020-01-19: qty 2

## 2020-01-19 MED ORDER — SODIUM CHLORIDE 0.9 % IV SOLN: INTRAVENOUS | Status: DC

## 2020-01-19 MED ORDER — AMPHETAMINE-DEXTROAMPHETAMINE 20 MG PO TABS: 30.0000 mg | ORAL_TABLET | Freq: Two times a day (BID) | ORAL | Status: DC | PRN

## 2020-01-19 MED ORDER — DEXAMETHASONE SODIUM PHOSPHATE 10 MG/ML IJ SOLN
INTRAMUSCULAR | Status: DC | PRN
  Administered 2020-01-19: 10 mg via INTRAVENOUS

## 2020-01-19 MED ORDER — TRANEXAMIC ACID-NACL 1000-0.7 MG/100ML-% IV SOLN
INTRAVENOUS | Status: AC
  Filled 2020-01-19: qty 100

## 2020-01-19 MED ORDER — HYDROMORPHONE HCL 1 MG/ML IJ SOLN
INTRAMUSCULAR | Status: AC
  Filled 2020-01-19: qty 0.5

## 2020-01-19 MED ORDER — ROCURONIUM BROMIDE 10 MG/ML (PF) SYRINGE
PREFILLED_SYRINGE | INTRAVENOUS | Status: DC | PRN
  Administered 2020-01-19 (×2): 20 mg via INTRAVENOUS
  Administered 2020-01-19: 40 mg via INTRAVENOUS

## 2020-01-19 MED ORDER — BUPIVACAINE HCL 0.5 % IJ SOLN
INTRAMUSCULAR | Status: DC | PRN
  Administered 2020-01-19: 10 mL

## 2020-01-19 MED ORDER — TRANEXAMIC ACID-NACL 1000-0.7 MG/100ML-% IV SOLN
1000.0000 mg | INTRAVENOUS | Status: AC
  Administered 2020-01-19: 1000 mg via INTRAVENOUS

## 2020-01-19 MED ORDER — CHLORHEXIDINE GLUCONATE 4 % EX LIQD: 60.0000 mL | Freq: Once | CUTANEOUS | Status: DC

## 2020-01-19 MED ORDER — DIPHENHYDRAMINE HCL 12.5 MG/5ML PO ELIX: 12.5000 mg | ORAL_SOLUTION | ORAL | Status: DC | PRN

## 2020-01-19 MED ORDER — DICLOFENAC SODIUM 1 % EX GEL
2.0000 g | Freq: Four times a day (QID) | CUTANEOUS | Status: DC | PRN
  Filled 2020-01-19: qty 100

## 2020-01-19 MED ORDER — ONDANSETRON HCL 4 MG/2ML IJ SOLN: 4.0000 mg | Freq: Four times a day (QID) | INTRAMUSCULAR | Status: DC | PRN

## 2020-01-19 MED ORDER — CHLORHEXIDINE GLUCONATE 0.12 % MT SOLN: 15.0000 mL | Freq: Once | OROMUCOSAL | Status: AC

## 2020-01-19 MED ORDER — PHENYLEPHRINE HCL-NACL 10-0.9 MG/250ML-% IV SOLN
INTRAVENOUS | Status: DC | PRN
  Administered 2020-01-19: 30 ug/min via INTRAVENOUS

## 2020-01-19 MED ORDER — DEXAMETHASONE SODIUM PHOSPHATE 10 MG/ML IJ SOLN
INTRAMUSCULAR | Status: AC
  Filled 2020-01-19: qty 1

## 2020-01-19 MED ORDER — ASPIRIN EC 325 MG PO TBEC
325.0000 mg | DELAYED_RELEASE_TABLET | Freq: Every day | ORAL | Status: DC
  Administered 2020-01-20: 325 mg via ORAL
  Filled 2020-01-19: qty 1

## 2020-01-19 MED ORDER — LACTATED RINGERS IV SOLN: INTRAVENOUS | Status: DC

## 2020-01-19 MED ORDER — BUPIVACAINE HCL (PF) 0.25 % IJ SOLN
INTRAMUSCULAR | Status: AC
  Filled 2020-01-19: qty 30

## 2020-01-19 MED ORDER — MORPHINE SULFATE (PF) 2 MG/ML IV SOLN: 0.5000 mg | INTRAVENOUS | Status: DC | PRN

## 2020-01-19 MED ORDER — ONDANSETRON HCL 4 MG/2ML IJ SOLN
INTRAMUSCULAR | Status: AC
  Filled 2020-01-19: qty 2

## 2020-01-19 MED ORDER — CEFAZOLIN SODIUM-DEXTROSE 2-4 GM/100ML-% IV SOLN
INTRAVENOUS | Status: AC
  Filled 2020-01-19: qty 100

## 2020-01-19 SURGICAL SUPPLY — 53 items
BALL HIP CERAMIC (Hips) ×1 IMPLANT
BENZOIN TINCTURE PRP APPL 2/3 (GAUZE/BANDAGES/DRESSINGS) ×2 IMPLANT
BLADE CLIPPER SURG (BLADE) IMPLANT
BLADE SAW SGTL 18X1.27X75 (BLADE) ×2 IMPLANT
CELLS DAT CNTRL 66122 CELL SVR (MISCELLANEOUS) ×1 IMPLANT
COVER SURGICAL LIGHT HANDLE (MISCELLANEOUS) ×2 IMPLANT
COVER WAND RF STERILE (DRAPES) ×2 IMPLANT
CUP SECTOR GRIPTON 50MM (Cup) ×2 IMPLANT
DRAPE C-ARM 42X72 X-RAY (DRAPES) ×2 IMPLANT
DRAPE IMP U-DRAPE 54X76 (DRAPES) ×2 IMPLANT
DRAPE STERI IOBAN 125X83 (DRAPES) ×2 IMPLANT
DRAPE U-SHAPE 47X51 STRL (DRAPES) ×6 IMPLANT
DRSG MEPILEX BORDER 4X8 (GAUZE/BANDAGES/DRESSINGS) ×2 IMPLANT
DURAPREP 26ML APPLICATOR (WOUND CARE) ×2 IMPLANT
ELECT BLADE 4.0 EZ CLEAN MEGAD (MISCELLANEOUS)
ELECT CAUTERY BLADE 6.4 (BLADE) ×2 IMPLANT
ELECT REM PT RETURN 9FT ADLT (ELECTROSURGICAL) ×2
ELECTRODE BLDE 4.0 EZ CLN MEGD (MISCELLANEOUS) IMPLANT
ELECTRODE REM PT RTRN 9FT ADLT (ELECTROSURGICAL) ×1 IMPLANT
ELIMINATOR HOLE APEX DEPUY (Hips) ×2 IMPLANT
FACESHIELD WRAPAROUND (MASK) ×4 IMPLANT
GLOVE BIOGEL PI IND STRL 8 (GLOVE) ×2 IMPLANT
GLOVE BIOGEL PI INDICATOR 8 (GLOVE) ×2
GLOVE ORTHO TXT STRL SZ7.5 (GLOVE) ×4 IMPLANT
GOWN STRL REUS W/ TWL LRG LVL3 (GOWN DISPOSABLE) ×1 IMPLANT
GOWN STRL REUS W/ TWL XL LVL3 (GOWN DISPOSABLE) ×1 IMPLANT
GOWN STRL REUS W/TWL 2XL LVL3 (GOWN DISPOSABLE) ×2 IMPLANT
GOWN STRL REUS W/TWL LRG LVL3 (GOWN DISPOSABLE) ×1
GOWN STRL REUS W/TWL XL LVL3 (GOWN DISPOSABLE) ×1
HIP BALL CERAMIC (Hips) ×2 IMPLANT
KIT BASIN OR (CUSTOM PROCEDURE TRAY) ×2 IMPLANT
KIT TURNOVER KIT B (KITS) ×2 IMPLANT
LINER ACET PNNCL PLUS4 NEUTRAL (Hips) ×1 IMPLANT
MANIFOLD NEPTUNE II (INSTRUMENTS) ×2 IMPLANT
NS IRRIG 1000ML POUR BTL (IV SOLUTION) ×2 IMPLANT
PACK TOTAL JOINT (CUSTOM PROCEDURE TRAY) ×2 IMPLANT
PAD ARMBOARD 7.5X6 YLW CONV (MISCELLANEOUS) ×4 IMPLANT
PINNACLE PLUS 4 NEUTRAL (Hips) ×2 IMPLANT
RTRCTR WOUND ALEXIS 18CM MED (MISCELLANEOUS) ×2
STAPLER VISISTAT (STAPLE) ×2 IMPLANT
STEM FEMORAL SZ 5MM STD ACTIS (Stem) ×2 IMPLANT
STRIP CLOSURE SKIN 1/2X4 (GAUZE/BANDAGES/DRESSINGS) ×2 IMPLANT
SUT VIC AB 0 CT1 27 (SUTURE) ×1
SUT VIC AB 0 CT1 27XBRD ANBCTR (SUTURE) ×1 IMPLANT
SUT VIC AB 2-0 CT1 27 (SUTURE) ×2
SUT VIC AB 2-0 CT1 TAPERPNT 27 (SUTURE) ×1 IMPLANT
SUT VICRYL 4-0 PS2 18IN ABS (SUTURE) ×2 IMPLANT
SUT VLOC 180 0 24IN GS25 (SUTURE) ×2 IMPLANT
TOWEL GREEN STERILE (TOWEL DISPOSABLE) ×4 IMPLANT
TOWEL GREEN STERILE FF (TOWEL DISPOSABLE) ×2 IMPLANT
TRAY CATH 16FR W/PLASTIC CATH (SET/KITS/TRAYS/PACK) IMPLANT
TRAY FOLEY MTR SLVR 16FR STAT (SET/KITS/TRAYS/PACK) IMPLANT
WATER STERILE IRR 1000ML POUR (IV SOLUTION) ×4 IMPLANT

## 2020-01-19 NOTE — Evaluation (Signed)
Physical Therapy Evaluation Patient Details Name: Valerie Delgado MRN: [id number] DOB: [birth year] Today's Date: 01/19/2020   History of Present Illness  Pt is a 54 y/o female s/p R THA, direct anterior. PMH includes TB, HTN, and Meniere's disease.   Clinical Impression  Pt is s/p surgery above with deficits below. Mobility limited to chair this session secondary to pain. Required min to min guard A for mobility using RW. Will continue to follow acutely to maximize functional mobility independence and safety.     Follow Up Recommendations Follow surgeon's recommendation for DC plan and follow-up therapies    Equipment Recommendations  Rolling walker with 5" wheels;3in1 (PT)    Recommendations for Other Services       Precautions / Restrictions Precautions Precautions: None Restrictions Weight Bearing Restrictions: Yes RLE Weight Bearing: Weight bearing as tolerated      Mobility  Bed Mobility Overal bed mobility: Needs Assistance Bed Mobility: Supine to Sit     Supine to sit: Min assist     General bed mobility comments: Min A for RLE assist.     Transfers Overall transfer level: Needs assistance Equipment used: Rolling walker (2 wheeled) Transfers: Sit to/from [facility] Sit to Stand: Min assist Stand pivot transfers: Min guard       General transfer comment: Min A for lift assist to stand and min guard A for safety to transfer to chair. Cues for safe use of RW.   Ambulation/Gait                Stairs            Wheelchair Mobility    Modified Rankin (Stroke Patients Only)       Balance Overall balance assessment: Needs assistance Sitting-balance support: No upper extremity supported;Feet supported Sitting balance-Leahy Scale: Good     Standing balance support: Bilateral upper extremity supported Standing balance-Leahy Scale: Poor Standing balance comment: Reliant on BUE support                               Pertinent Vitals/Pain Pain Assessment: Faces Faces Pain Scale: Hurts little more Pain Location: R hip  Pain Descriptors / Indicators: Aching;Operative site guarding Pain Intervention(s): Limited activity within patient's tolerance;Monitored during session;Repositioned    Home Living Family/patient expects to be discharged to:: Private residence Living Arrangements: Spouse/significant other Available Help at Discharge: Family Type of Home: House Home Access: Stairs to enter Entrance Stairs-Rails: Left Entrance Stairs-Number of Steps: flight Home Layout: One level Home Equipment: None      Prior Function Level of Independence: Independent               Hand Dominance        Extremity/Trunk Assessment   Upper Extremity Assessment Upper Extremity Assessment: Defer to OT evaluation    Lower Extremity Assessment Lower Extremity Assessment: RLE deficits/detail RLE Deficits / Details: Deficits consistent with post op pain and weakness.     Cervical / Trunk Assessment Cervical / Trunk Assessment: Normal  Communication   Communication: No difficulties  Cognition Arousal/Alertness: Awake/alert Behavior During Therapy: WFL for tasks assessed/performed Overall Cognitive Status: Within Functional Limits for tasks assessed                                        General Comments      Exercises  Assessment/Plan    PT Assessment Patient needs continued PT services  PT Problem List Decreased strength;Decreased range of motion;Decreased activity tolerance;Decreased balance;Decreased mobility;Decreased knowledge of use of DME;Pain       PT Treatment Interventions Stair training;Gait training;DME instruction;Therapeutic activities;Functional mobility training;Balance training;Therapeutic exercise;Patient/family education    PT Goals (Current goals can be found in the Care Plan section)  Acute Rehab PT Goals Patient Stated Goal: to go home PT  Goal Formulation: With patient Time For Goal Achievement: 02/02/20 Potential to Achieve Goals: Good    Frequency 7X/week   Barriers to discharge        Co-evaluation               AM-PAC PT "6 Clicks" Mobility  Outcome Measure Help needed turning from your back to your side while in a flat bed without using bedrails?: A Little Help needed moving from lying on your back to sitting on the side of a flat bed without using bedrails?: A Little Help needed moving to and from a bed to a chair (including a wheelchair)?: A Little Help needed standing up from a chair using your arms (e.g., wheelchair or bedside chair)?: A Little Help needed to walk in hospital room?: A Little Help needed climbing 3-5 steps with a railing? : A Lot 6 Click Score: 17    End of Session   Activity Tolerance: Patient limited by pain Patient left: in chair;with call bell/phone within reach;with nursing/sitter in room (in recliner in PACU bay) Nurse Communication: Mobility status PT Visit Diagnosis: Unsteadiness on feet (R26.81);Muscle weakness (generalized) (M62.81);Pain Pain - Right/Left: Right Pain - part of body: Hip    Time: 7544-9201 PT Time Calculation (min) (ACUTE ONLY): 20 min   Charges:   PT Evaluation $PT Eval Low Complexity: 1 Low          Lou Miner, DPT  Acute Rehabilitation Services  Pager: [phone] Office: [phone]   Rudean Hitt 01/19/2020, 5:12 PM

## 2020-01-19 NOTE — Transfer of Care (Signed)
Immediate Anesthesia Transfer of Care Note  Patient: Valerie Delgado  Procedure(s) Performed: RIGHT TOTAL HIP ARTHROPLASTY-DIRECT ANTERIOR (Right Hip)  Patient Location: PACU  Anesthesia Type:General  Level of Consciousness: awake, drowsy and patient cooperative  Airway & Oxygen Therapy: Patient Spontanous Breathing and Patient connected to nasal cannula oxygen  Post-op Assessment: Report given to RN and Post -op Vital signs reviewed and stable  Post vital signs: Reviewed and stable  Last Vitals:  Vitals Value Taken Time  BP 133/83 01/19/20 1432  Temp 36.5 C 01/19/20 1432  Pulse 99 01/19/20 1434  Resp 14 01/19/20 1434  SpO2 96 % 01/19/20 1434  Vitals shown include unvalidated device data.  Last Pain:  Vitals:   01/19/20 1006  TempSrc:   PainSc: 3       Patients Stated Pain Goal: 3 (76/72/09 4709)  Complications: No complications documented.

## 2020-01-19 NOTE — Anesthesia Preprocedure Evaluation (Addendum)
Anesthesia Evaluation  Patient identified by MRN, date of birth, ID band Patient awake    Reviewed: Allergy & Precautions, NPO status , Patient's Chart, lab work & pertinent test results  Airway Mallampati: II  TM Distance: >3 FB     Dental   Pulmonary former smoker,    breath sounds clear to auscultation       Cardiovascular hypertension,  Rhythm:Regular Rate:Normal     Neuro/Psych Anxiety Depression    GI/Hepatic negative GI ROS, Neg liver ROS,   Endo/Other  negative endocrine ROS  Renal/GU negative Renal ROS     Musculoskeletal  (+) Arthritis ,   Abdominal   Peds  Hematology   Anesthesia Other Findings   Reproductive/Obstetrics                            Anesthesia Physical Anesthesia Plan  ASA: III  Anesthesia Plan: General   Post-op Pain Management:    Induction: Intravenous  PONV Risk Score and Plan: 3 and Dexamethasone, Midazolam and Ondansetron  Airway Management Planned: Oral ETT  Additional Equipment:   Intra-op Plan:   Post-operative Plan: Extubation in OR  Informed Consent: I have reviewed the patients History and Physical, chart, labs and discussed the procedure including the risks, benefits and alternatives for the proposed anesthesia with the patient or authorized representative who has indicated his/her understanding and acceptance.     Dental advisory given  Plan Discussed with: CRNA and Anesthesiologist  Anesthesia Plan Comments:         Anesthesia Quick Evaluation

## 2020-01-19 NOTE — H&P (Signed)
TOTAL HIP ADMISSION H&P  Patient is admitted for right total hip arthroplasty.  Subjective:  Chief Complaint: right hip pain  HPI: Valerie Delgado, 54 y.o. female, has a history of pain and functional disability in the right hip(s) due to arthritis and patient has failed non-surgical conservative treatments for greater than 12 weeks to include NSAID's and/or analgesics, use of assistive devices and activity modification.  Onset of symptoms was gradual starting 10 years ago with gradually worsening course since that time.The patient noted no past surgery on the right hip(s).  Patient currently rates pain in the right hip at 10 out of 10 with activity. Patient has night pain, worsening of pain with activity and weight bearing, trendelenberg gait, pain that interfers with activities of daily living and pain with passive range of motion. Patient has evidence of subchondral cysts, subchondral sclerosis, periarticular osteophytes and joint space narrowing by imaging studies. This condition presents safety issues increasing the risk of falls. .  There is no current active infection.  Patient Active Problem List   Diagnosis Date Noted  . Unilateral primary osteoarthritis, right hip 09/11/2019  . Decreased visual acuity 03/30/2017  . Estrogen deficiency 03/30/2017  . Inflammatory arthritis 11/13/2016  . Essential hypertension 09/19/2016  . History of anxiety and depression 09/19/2016  . History of Meniere's disease 09/19/2016  . History of hearing loss 09/19/2016  . History of ADHD 09/19/2016  . Dyspareunia, female 01/11/2016  . Routine general medical examination at a health care facility 01/11/2016  . Smoker 09/01/2015  . Anxiety and depression 09/01/2015  . Positive QuantiFERON-TB Gold test 09/01/2015  . History of herpes simplex infection 09/01/2015   Past Medical History:  Diagnosis Date  . Allergy   . Anxiety   . Arthritis   . Depression   . Hypertension   . Meniere's disease of left ear    . Psoriatic arthritis ([place])    Dr. Estanislado Pandy  . Tuberculosis    Latent TB, started treatment on 08/30/2015    Past Surgical History:  Procedure Laterality Date  . CESAREAN SECTION    . COLONOSCOPY    . MOUTH SURGERY  2020   tooth extraction/ bone graft     Current Facility-Administered Medications  Medication Dose Route Frequency Provider Last Rate Last Admin  . bupivacaine liposome (EXPAREL) 1.3 % injection 266 mg  20 mL Other Once Marybelle Killings, MD       Current Outpatient Medications  Medication Sig Dispense Refill Last Dose  . ALPRAZolam (XANAX) 1 MG tablet Take 1 mg by mouth 2 (two) times daily as needed for anxiety.      Marland Kitchen amLODipine (NORVASC) 10 MG tablet Take 10 mg by mouth daily.     Marland Kitchen amphetamine-dextroamphetamine (ADDERALL) 30 MG tablet Take 30 mg by mouth 2 (two) times daily as needed (ADHD).      Marland Kitchen diclofenac Sodium (VOLTAREN) 1 % GEL Apply 1 g topically 4 (four) times daily as needed (pain.).     Marland Kitchen diphenhydrAMINE HCl, Sleep, (UNISOM SLEEPGELS) 50 MG CAPS Take 50 mg by mouth at bedtime as needed (sleep).     . DULoxetine (CYMBALTA) 60 MG capsule Take 120 mg by mouth daily.     Marland Kitchen ibuprofen (ADVIL,MOTRIN) 200 MG tablet Take 400 mg by mouth every 6 (six) hours as needed for moderate pain.      . Menthol, Topical Analgesic, (ICY HOT EX) Apply 1 application topically 4 (four) times daily as needed (pain.).     Marland Kitchen Multiple  Vitamin (MULTIVITAMIN WITH MINERALS) TABS tablet Take 1 tablet by mouth daily.     . pseudoephedrine (SUDAFED) 30 MG tablet Take 30 mg by mouth every 4 (four) hours as needed (sinus headache.).      Marland Kitchen Secukinumab, 300 MG Dose, (COSENTYX SENSOREADY, 300 MG,) 150 MG/ML SOAJ Inject 2 pens in the skin every 7 days for 5 weeks then 2 pens every 28 days. (Patient taking differently: Inject 300 mg as directed every 28 (twenty-eight) days. ) 10 mL 0   . valACYclovir (VALTREX) 500 MG tablet TAKE 1 TABLET(500 MG) BY MOUTH TWICE DAILY (Patient taking differently: Take  500 mg by mouth daily as needed. ) 60 tablet 0   . traMADol (ULTRAM) 50 MG tablet One tablet twice daily prn severe pain. (Patient taking differently: Take 50 mg by mouth 2 (two) times daily as needed for severe pain. ) 30 tablet 0    Allergies  Allergen Reactions  . Otezla [Apremilast]     Severe depression   . Sulfur     Makes stomach upset    Social History   Tobacco Use  . Smoking status: Former Smoker    Years: 30.00  . Smokeless tobacco: Never Used  Substance Use Topics  . Alcohol use: Yes    Comment: occ    Family History  Problem Relation Age of Onset  . Hypertension Mother   . Cancer Mother        breast  . Arthritis Mother   . Depression Mother   . Hypertension Father   . Hypertension Brother   . Stroke Brother   . Colon cancer Neg Hx   . Esophageal cancer Neg Hx   . Rectal cancer Neg Hx   . Stomach cancer Neg Hx      Review of Systems  Constitutional: Positive for activity change.  HENT: Negative.   Respiratory: Negative.   Cardiovascular: Negative.   Gastrointestinal: Negative.   Genitourinary: Negative.   Musculoskeletal: Positive for gait problem.  Psychiatric/Behavioral: Negative.     Objective:  Physical Exam HENT:     Head: Normocephalic and atraumatic.  Eyes:     Extraocular Movements: Extraocular movements intact.     Pupils: Pupils are equal, round, and reactive to light.  Cardiovascular:     Rate and Rhythm: Regular rhythm.  Pulmonary:     Effort: Pulmonary effort is normal. No respiratory distress.  Musculoskeletal:        General: Tenderness present.  Skin:    General: Skin is warm and dry.  Neurological:     General: No focal deficit present.     Mental Status: She is alert and oriented to person, place, and time.  Psychiatric:        Mood and Affect: Mood normal.     Vital signs in last 24 hours:    Labs:   Estimated body mass index is 26.37 kg/m as calculated from the following:   Height as of 01/16/20: 5\' 6"   (1.676 m).   Weight as of 01/16/20: 74.1 kg.   Imaging Review Plain radiographs demonstrate moderate degenerative joint disease of the right hip(s). The bone quality appears to be good for age and reported activity level.      Assessment/Plan:  End stage arthritis, right hip(s)  The patient history, physical examination, clinical judgement of the provider and imaging studies are consistent with end stage degenerative joint disease of the right hip(s) and total hip arthroplasty is deemed medically necessary. The treatment options including  medical management, injection therapy, arthroscopy and arthroplasty were discussed at length. The risks and benefits of total hip arthroplasty were presented and reviewed. The risks due to aseptic loosening, infection, stiffness, dislocation/subluxation,  thromboembolic complications and other imponderables were discussed.  The patient acknowledged the explanation, agreed to proceed with the plan and consent was signed. Patient is being admitted for inpatient treatment for surgery, pain control, PT, OT, prophylactic antibiotics, VTE prophylaxis, progressive ambulation and ADL's and discharge planning.The patient is planning to be discharged home with home health services

## 2020-01-19 NOTE — Op Note (Signed)
Preop diagnosis: Right hip primary osteoarthritis  Postop diagnosis: Same  Procedure: Right total arthroplasty, direct anterior approach.  Surgeon: Rodell Perna, MD  Assistant: Benjiman Core, PA-C medically necessary and present for implantation of the prosthesis critical portion of the procedure and closure.  Anesthesia: General orotracheal +10 cc Marcaine +10 cc Exparel.  EBL: See anesthetic record, approximately 200 cc.  Implants:Depuy Gription 50 mm cup +4 neutral liner without limp.  Standard size 5 Actis femoral stem with +5 ceramic ball.  Procedure: After induction of general anesthesia Foley catheter was placed it was noted she had extremely small urethra and once Foley was placed there was good clear urine.  Hana boots were applied patient placed on the Hana table careful padding positioning C-arm was brought in good visualization of the hip and leg lengths which were equal.  Preoperative antibiotics were given TXA was given IV by anesthesia.  1015 drapes were applied DuraPrep the usual total hip sheets drapes, large shower curtain Betadine Steri-Drape and sheets across antibiotic.  Timeout procedure was completed hydraulic hook was prepared by placing the hydraulic arm and sealing it with a piece of Steri-Drape.  After timeout procedure incision was started 1 cm lateral 1 inferior to the ASIS obliquely to the trochanter fascia was visualized Extended.  Dull cobra was placed medially over the top the capsule transverse bleeders were divided anterior capsule was opened.  Hip fluid was clear large osteophytes were noted there was significant deformity of the head and after cutting the neck under C arm completing the lateral superior aspect with the osteotome head was removed with a corkscrew with some difficulty.  There is bone-on-bone changes no bone left on over 50% of the femoral head.  Sequential reaming up to 49 for 50 cup.  Cup was secured apex hole illuminator was inserted and C arm was  used for cup abduction and Flexion.  Poly was dialed in +4 neutral liner without lip impacted and was secure.  Next hydraulic arm was placed leg was externally rotated 120 taken down and under Mueller retractor medial margin trochanteric clamp after releasing the posterior capsule but not short external rotators.  Preparation of the canal with cookie-cutter rondure lateralization and sequential progressing up to #5 stem.  We stopped 3 check to make sure were directly down the middle and then progressed up to 5 which totally filled the canal.  +5 gave restoration of leg lengths.  Permanent 5 stem was placed after irrigation.  There is good stability identical with trials with hip and 90 degrees external rotation and then could be taken all the way down to the floor with hip still stable.  It was trace shuck.  Leg lengths are equal by x-rays fluoroscopic images.  Irrigation saline will be debridement of muscle V lock closure Marcaine Exparel infiltration temple standing pulse 20 and then subtenons reapproximation skin staples postop dressing and transferred to care room.  Instrument count needle count was correct patient tolerated procedure well.

## 2020-01-19 NOTE — Anesthesia Postprocedure Evaluation (Signed)
Anesthesia Post Note  Patient: Valerie Delgado  Procedure(s) Performed: RIGHT TOTAL HIP ARTHROPLASTY-DIRECT ANTERIOR (Right Hip)     Patient location during evaluation: PACU Anesthesia Type: General Level of consciousness: awake Pain management: pain level controlled Vital Signs Assessment: post-procedure vital signs reviewed and stable Respiratory status: spontaneous breathing Cardiovascular status: stable Postop Assessment: no apparent nausea or vomiting Anesthetic complications: no   No complications documented.  Last Vitals:  Vitals:   01/19/20 1432 01/19/20 1502  BP: 133/83   Pulse: 96   Resp: 15   Temp: 36.5 C   SpO2: 98% 96%    Last Pain:  Vitals:   01/19/20 1502  TempSrc:   PainSc: 0-No pain                 Cece Milhouse

## 2020-01-19 NOTE — Anesthesia Procedure Notes (Signed)
Procedure Name: Intubation Date/Time: 01/19/2020 12:30 PM Performed by: Renato Shin, CRNA Pre-anesthesia Checklist: Patient identified, Emergency Drugs available, Suction available and Patient being monitored Patient Re-evaluated:Patient Re-evaluated prior to induction Oxygen Delivery Method: Circle system utilized Preoxygenation: Pre-oxygenation with 100% oxygen Induction Type: IV induction Ventilation: Mask ventilation without difficulty Laryngoscope Size: Miller and 2 Grade View: Grade I Tube type: Oral Tube size: 7.0 mm Number of attempts: 1 Airway Equipment and Method: Stylet and Oral airway Placement Confirmation: ETT inserted through vocal cords under direct vision,  positive ETCO2 and breath sounds checked- equal and bilateral Secured at: 22 cm Tube secured with: Tape Dental Injury: Teeth and Oropharynx as per pre-operative assessment

## 2020-01-19 NOTE — Interval H&P Note (Signed)
History and Physical Interval Note:  01/19/2020 11:47 AM  Valerie Delgado  has presented today for surgery, with the diagnosis of right hip osteoarthritis.  The various methods of treatment have been discussed with the patient and family. After consideration of risks, benefits and other options for treatment, the patient has consented to  Procedure(s): RIGHT TOTAL HIP ARTHROPLASTY-DIRECT ANTERIOR (Right) as a surgical intervention.  The patient's history has been reviewed, patient examined, no change in status, stable for surgery.  I have reviewed the patient's chart and labs.  Questions were answered to the patient's satisfaction.     Marybelle Killings

## 2020-01-20 ENCOUNTER — Encounter (HOSPITAL_COMMUNITY): Payer: Self-pay | Admitting: Orthopaedic Surgery

## 2020-01-20 DIAGNOSIS — M1611 Unilateral primary osteoarthritis, right hip: Secondary | ICD-10-CM | POA: Diagnosis not present

## 2020-01-20 LAB — CBC
HCT: 33 % — ABNORMAL LOW (ref 36.0–46.0)
Hemoglobin: 11 g/dL — ABNORMAL LOW (ref 12.0–15.0)
MCH: 30.5 pg (ref 26.0–34.0)
MCHC: 33.3 g/dL (ref 30.0–36.0)
MCV: 91.4 fL (ref 80.0–100.0)
Platelets: 304 10*3/uL (ref 150–400)
RBC: 3.61 MIL/uL — ABNORMAL LOW (ref 3.87–5.11)
RDW: 13.1 % (ref 11.5–15.5)
WBC: 15.8 10*3/uL — ABNORMAL HIGH (ref 4.0–10.5)
nRBC: 0 % (ref 0.0–0.2)

## 2020-01-20 LAB — GLUCOSE, CAPILLARY: Glucose-Capillary: 135 mg/dL — ABNORMAL HIGH (ref 70–99)

## 2020-01-20 MED ORDER — METHOCARBAMOL 500 MG PO TABS: 500.0000 mg | ORAL_TABLET | Freq: Four times a day (QID) | ORAL | 0 refills | Status: DC

## 2020-01-20 MED ORDER — OXYCODONE-ACETAMINOPHEN 5-325 MG PO TABS
1.0000 | ORAL_TABLET | Freq: Four times a day (QID) | ORAL | 0 refills | Status: DC | PRN
Start: 2020-01-20 — End: 2020-03-31

## 2020-01-20 MED ORDER — ASPIRIN 325 MG PO TBEC
325.0000 mg | DELAYED_RELEASE_TABLET | Freq: Every day | ORAL | 0 refills | Status: DC
Start: 2020-01-20 — End: 2020-03-31

## 2020-01-20 NOTE — Progress Notes (Signed)
Walked 200 ft with therapy. They recommended HHPT. Ordered signed. ROV 2 wks .

## 2020-01-20 NOTE — Plan of Care (Signed)
°  Problem: Health Behavior/Discharge Planning: Goal: Ability to manage health-related needs will improve 01/20/2020 1142 by Governor Rooks, RN Outcome: Adequate for Discharge 01/20/2020 0859 by Governor Rooks, RN Outcome: Progressing   Problem: Activity: Goal: Risk for activity intolerance will decrease 01/20/2020 1142 by Governor Rooks, RN Outcome: Adequate for Discharge 01/20/2020 0859 by Governor Rooks, RN Outcome: Progressing   Problem: Pain Managment: Goal: General experience of comfort will improve 01/20/2020 1142 by Governor Rooks, RN Outcome: Adequate for Discharge 01/20/2020 0859 by Governor Rooks, RN Outcome: Progressing

## 2020-01-20 NOTE — Progress Notes (Signed)
Pt given discharge instructions and gone over with her. She verbalized understanding. All belongings gathered to be sent home. Walker taken home. Pt's husband driving her home.

## 2020-01-20 NOTE — Discharge Instructions (Signed)
INSTRUCTIONS AFTER JOINT REPLACEMENT   o Remove items at home which could result in a fall. This includes throw rugs or furniture in walking pathways o ICE to the affected joint every three hours while awake for 30 minutes at a time, for at least the first 3-5 days, and then as needed for pain and swelling.  Continue to use ice for pain and swelling. You may notice swelling that will progress down to the foot and ankle.  This is normal after surgery.  Elevate your leg when you are not up walking on it.   o Continue to use the breathing machine you got in the hospital (incentive spirometer) which will help keep your temperature down.  It is common for your temperature to cycle up and down following surgery, especially at night when you are not up moving around and exerting yourself.  The breathing machine keeps your lungs expanded and your temperature down.   DIET:  As you were doing prior to hospitalization, we recommend a well-balanced diet.  DRESSING / WOUND CARE / SHOWERING  You may change your dressing 3-5 days after surgery.  Then change the dressing every day with sterile gauze.  Please use good hand washing techniques before changing the dressing.  Do not use any lotions or creams on the incision until instructed by your surgeon.  ACTIVITY  o Increase activity slowly as tolerated, but follow the weight bearing instructions below.   o No driving for 6 weeks or until further direction given by your physician.  You cannot drive while taking narcotics.  o No lifting or carrying greater than 10 lbs. until further directed by your surgeon. o Avoid periods of inactivity such as sitting longer than an hour when not asleep. This helps prevent blood clots.  o You may return to work once you are authorized by your doctor.     WEIGHT BEARING   Weight bearing as tolerated with assist device (walker, cane, etc) as directed, use it as long as suggested by your surgeon or therapist, typically at  least 4-6 weeks.   EXERCISES  Results after joint replacement surgery are often greatly improved when you follow the exercise, range of motion and muscle strengthening exercises prescribed by your doctor. Safety measures are also important to protect the joint from further injury. Any time any of these exercises cause you to have increased pain or swelling, decrease what you are doing until you are comfortable again and then slowly increase them. If you have problems or questions, call your caregiver or physical therapist for advice.   Rehabilitation is important following a joint replacement. After just a few days of immobilization, the muscles of the leg can become weakened and shrink (atrophy).  These exercises are designed to build up the tone and strength of the thigh and leg muscles and to improve motion. Often times heat used for twenty to thirty minutes before working out will loosen up your tissues and help with improving the range of motion but do not use heat for the first two weeks following surgery (sometimes heat can increase post-operative swelling).   A rehabilitation program following joint replacement surgery can speed recovery and prevent re-injury in the future due to weakened muscles. Contact your doctor or a physical therapist for more information on knee rehabilitation.    CONSTIPATION  Constipation is defined medically as fewer than three stools per week and severe constipation as less than one stool per week.  Even if you have a regular  bowel pattern at home, your normal regimen is likely to be disrupted due to multiple reasons following surgery.  Combination of anesthesia, postoperative narcotics, change in appetite and fluid intake all can affect your bowels.   YOU MUST use at least one of the following options; they are listed in order of increasing strength to get the job done.  They are all available over the counter, and you may need to use some, POSSIBLY even all of  these options:    Drink plenty of fluids (prune juice may be helpful) and high fiber foods Colace 100 mg by mouth twice a day  Senokot for constipation as directed and as needed Dulcolax (bisacodyl), take with full glass of water  Miralax (polyethylene glycol) once or twice a day as needed.  If you have tried all these things and are unable to have a bowel movement in the first 3-4 days after surgery call either your surgeon or your primary doctor.    If you experience loose stools or diarrhea, hold the medications until you stool forms back up.  If your symptoms do not get better within 1 week or if they get worse, check with your doctor.  If you experience "the worst abdominal pain ever" or develop nausea or vomiting, please contact the office immediately for further recommendations for treatment.   ITCHING:  If you experience itching with your medications, try taking only a single pain pill, or even half a pain pill at a time.  You can also use Benadryl over the counter for itching or also to help with sleep.   TED HOSE STOCKINGS:  Use stockings on both legs until for at least 2 weeks or as directed by physician office. They may be removed at night for sleeping.  MEDICATIONS:  See your medication summary on the "After Visit Summary" that nursing will review with you.  You may have some home medications which will be placed on hold until you complete the course of blood thinner medication.  It is important for you to complete the blood thinner medication as prescribed.  PRECAUTIONS:  If you experience chest pain or shortness of breath - call 911 immediately for transfer to the hospital emergency department.   If you develop a fever greater that 101 F, purulent drainage from wound, increased redness or drainage from wound, foul odor from the wound/dressing, or calf pain - CONTACT YOUR SURGEON.                                                   FOLLOW-UP APPOINTMENTS:  If you do not already have  a post-op appointment, please call the office for an appointment to be seen by your surgeon.  Guidelines for how soon to be seen are listed in your "After Visit Summary", but are typically between 1-4 weeks after surgery.  OTHER INSTRUCTIONS:   Knee Replacement:  Do not place pillow under knee, focus on keeping the knee straight while resting. CPM instructions: 0-90 degrees, 2 hours in the morning, 2 hours in the afternoon, and 2 hours in the evening. Place foam block, curve side up under heel at all times except when in CPM or when walking.  DO NOT modify, tear, cut, or change the foam block in any way.   DENTAL ANTIBIOTICS:  In most cases prophylactic antibiotics for Dental procdeures after  total joint surgery are not necessary.  Exceptions are as follows:  1. History of prior total joint infection  2. Severely immunocompromised (Organ Transplant, cancer chemotherapy, Rheumatoid biologic meds such as [place])  3. Poorly controlled diabetes (A1C &gt; 8.0, blood glucose over 200)  If you have one of these conditions, contact your surgeon for an antibiotic prescription, prior to your dental procedure.   MAKE SURE YOU:  . Understand these instructions.  . Get help right away if you are not doing well or get worse.    Thank you for letting us be a part of your medical care team.  It is a privilege we respect greatly.  We hope these instructions will help you stay on track for a fast and full recovery!

## 2020-01-20 NOTE — Progress Notes (Signed)
   Subjective: 1 Day Post-Op Procedure(s) (LRB): RIGHT TOTAL HIP ARTHROPLASTY-DIRECT ANTERIOR (Right) Patient reports pain as moderate.    Objective: Vital signs in last 24 hours: Temp:  [97.7 F (36.5 C)-98.3 F (36.8 C)] 98.3 F (36.8 C) (11/30 0500) Pulse Rate:  [78-96] 95 (11/30 0500) Resp:  [15-20] 15 (11/30 0500) BP: (110-134)/(78-94) 110/83 (11/30 0500) SpO2:  [95 %-100 %] 100 % (11/30 0500) Weight:  [73.5 kg] 73.5 kg (11/29 0953)  Intake/Output from previous day: 11/29 0701 - 11/30 0700 In: 3043.5 [P.O.:590; I.V.:2003.5; IV Piggyback:300] Out: 1950 [Urine:1550; Blood:400] Intake/Output this shift: No intake/output data recorded.  Recent Labs    01/20/20 0421  HGB 11.0*   Recent Labs    01/20/20 0421  WBC 15.8*  RBC 3.61*  HCT 33.0*  PLT 304   No results for input(s): NA, K, CL, CO2, BUN, CREATININE, GLUCOSE, CALCIUM in the last 72 hours. No results for input(s): LABPT, INR in the last 72 hours.  Neurologically intact, dressing dry . LL equal.  DG C-Arm 1-60 Min  Result Date: 01/19/2020 CLINICAL DATA:  RIGHT anterior hip EXAM: OPERATIVE RIGHT HIP (WITH PELVIS IF PERFORMED) 2 VIEWS TECHNIQUE: Fluoroscopic spot image(s) were submitted for interpretation post-operatively. COMPARISON:  None. FINDINGS: RIGHT hip total arthroplasty.  No complicating features IMPRESSION: RIGHT hip arthroplasty without complication RIGHT hip arthroplasty. Electronically Signed   By: Suzy Bouchard M.D.   On: 01/19/2020 14:16   DG HIP OPERATIVE UNILAT W OR W/O PELVIS RIGHT  Result Date: 01/19/2020 CLINICAL DATA:  RIGHT anterior hip EXAM: OPERATIVE RIGHT HIP (WITH PELVIS IF PERFORMED) 2 VIEWS TECHNIQUE: Fluoroscopic spot image(s) were submitted for interpretation post-operatively. COMPARISON:  None. FINDINGS: RIGHT hip total arthroplasty.  No complicating features IMPRESSION: RIGHT hip arthroplasty without complication RIGHT hip arthroplasty. Electronically Signed   By: Suzy Bouchard M.D.   On: 01/19/2020 14:16    Assessment/Plan: 1 Day Post-Op Procedure(s) (LRB): RIGHT TOTAL HIP ARTHROPLASTY-DIRECT ANTERIOR (Right) Up with therapy, discharge home later today. Has steps to get into house.   Marybelle Killings 01/20/2020, 7:38 AM

## 2020-01-20 NOTE — Progress Notes (Signed)
Physical Therapy Treatment Patient Details Name: Valerie Delgado MRN: [id number] DOB: [birth year] Today's Date: 01/20/2020    History of Present Illness Pt is a 54 y/o female s/p R THA, direct anterior. PMH includes TB, HTN, and Meniere's disease.     PT Comments    Pt supine on arrival, agreeable to therapy session with good participation and tolerance for session. Pt performed bed mobility with decreased assist this session, needing min cues/Supervision and progressed from min guard to Supervision for sit<>stand from bed height to RW. Pt progressed gait distance to 29ft including standing break and performed 2 steps with RW and minA. Pt needs reinforcement of sequencing for stairs and safety due to mild impulsivity, spouse present also and receptive to instruction. She performed supine RLE A/AAROM therapeutic exercises per THA packet with good tolerance, and reviewed post-op precautions/safety. Pt continues to benefit from PT services to progress toward functional mobility goals. Anticipate pt clear to D/C home once medically cleared.   Follow Up Recommendations  Follow surgeon's recommendation for DC plan and follow-up therapies     Equipment Recommendations  Rolling walker with 5" wheels (pt refusing 3 in 1)    Recommendations for Other Services       Precautions / Restrictions Precautions Precautions: None Restrictions Weight Bearing Restrictions: Yes RLE Weight Bearing: Weight bearing as tolerated    Mobility  Bed Mobility Overal bed mobility: Needs Assistance Bed Mobility: Supine to Sit     Supine to sit: Supervision     General bed mobility comments: pt defers assist with RLE, able to perform with increased time and min cues, no rails  Transfers Overall transfer level: Needs assistance Equipment used: Rolling walker (2 wheeled) Transfers: Sit to/from Stand Sit to Stand: Min guard;Supervision         General transfer comment: min guard to stand initially from EOB,  progressing to Supervision, needs cues for UE placement/sequencing as pt tends to sit close to edge  Ambulation/Gait Ambulation/Gait assistance: Supervision;Min guard Gait Distance (Feet): 100 Feet (ft x2 with standing break) Assistive device: Rolling walker (2 wheeled) Gait Pattern/deviations: Step-through pattern;Decreased stride length;Decreased weight shift to right;Antalgic;Trunk flexed Gait velocity: grossly decreased   General Gait Details: pt tends to hunched posture and decreased heel toe pattern bilaterally, heavy reliance on BUE to offload painful RLE; cues for improved proximity to RW and upright posture (note pt reports herniated discs and posture/gait deviations may be related to this issue as well)   Stairs Stairs: Yes Stairs assistance: Min assist Stair Management: With walker;Step to pattern Number of Stairs: 2 General stair comments: pt ascended/descended single step with step-to pattern and BUE support of RW, reports no rails at home if entering this door. Spouse present and receptive. pt forgot sequencing and ignored cues to ascend with painful RLE but with reinforcement able to perform properly on second attempt; defers further attempts; no LOB   Wheelchair Mobility    Modified Rankin (Stroke Patients Only)       Balance Overall balance assessment: Needs assistance Sitting-balance support: Single extremity supported;Feet supported Sitting balance-Leahy Scale: Good Sitting balance - Comments: pt leaning to L due to R hip pain but no overt LOB; cues not to sit so close to EOB Postural control: Left lateral lean Standing balance support: Bilateral upper extremity supported Standing balance-Leahy Scale: Fair Standing balance comment: pt able to static stand with U UE support and briefly unsupported no LOB, needs BUE support for dynamic standing tasks 2/2 pain  Cognition Arousal/Alertness: Awake/alert Behavior During  Therapy: WFL for tasks assessed/performed;Impulsive Overall Cognitive Status: Within Functional Limits for tasks assessed                                 General Comments: pt self-directed but motivated, some decreased safety awareness and given cues for safety with mobility, pt spouse present and receptive to instruction      Exercises Total Joint Exercises Ankle Circles/Pumps: AROM;Both;10 reps;Supine Quad Sets: AROM;Both;10 reps;Supine Heel Slides: AROM;Right;10 reps;Supine Hip ABduction/ADduction: AAROM;Right;10 reps;Supine Long Arc Quad: AROM;Right;5 reps;Seated Marching in Standing:  (pt deferred 2/2 pain)    General Comments General comments (skin integrity, edema, etc.): Pt SpO2 94% on RA and HR up to 122 bpm during ambulation; very painful; pulling up to 1750 on IS      Pertinent Vitals/Pain Pain Assessment: 0-10 Pain Score: 9  Pain Location: R hip  Pain Descriptors / Indicators: Aching;Operative site guarding;Sore;Shooting Pain Intervention(s): Monitored during session;RN gave pain meds during session;Ice applied;Repositioned   Vitals:   01/20/20 0825  BP: 122/80  Pulse: 70  Resp: 16  Temp: 97.8 F (36.6 C)  SpO2: 97%    Home Living                      Prior Function            PT Goals (current goals can now be found in the care plan section) Acute Rehab PT Goals Patient Stated Goal: to go home PT Goal Formulation: With patient Time For Goal Achievement: 02/02/20 Potential to Achieve Goals: Good Progress towards PT goals: Progressing toward goals    Frequency    7X/week      PT Plan Current plan remains appropriate    Co-evaluation              AM-PAC PT "6 Clicks" Mobility   Outcome Measure  Help needed turning from your back to your side while in a flat bed without using bedrails?: None Help needed moving from lying on your back to sitting on the side of a flat bed without using bedrails?: None Help needed  moving to and from a bed to a chair (including a wheelchair)?: A Little Help needed standing up from a chair using your arms (e.g., wheelchair or bedside chair)?: A Little Help needed to walk in hospital room?: A Little Help needed climbing 3-5 steps with a railing? : A Little 6 Click Score: 20    End of Session Equipment Utilized During Treatment: Gait belt Activity Tolerance: Patient tolerated treatment well;Patient limited by pain Patient left: in chair;with call bell/phone within reach;with family/visitor present Nurse Communication: Mobility status;Other (comment) (pt cleared to DC) PT Visit Diagnosis: Unsteadiness on feet (R26.81);Muscle weakness (generalized) (M62.81);Pain Pain - Right/Left: Right Pain - part of body: Hip     Time: 4128-7867 PT Time Calculation (min) (ACUTE ONLY): 42 min  Charges:  $Gait Training: 8-22 mins $Therapeutic Exercise: 8-22 mins $Therapeutic Activity: 8-22 mins                     Orion Mole P., PTA Acute Rehabilitation Services Pager: [phone] Office: [place] 01/20/2020, 10:43 AM

## 2020-01-20 NOTE — Plan of Care (Signed)
  Problem: Health Behavior/Discharge Planning: Goal: Ability to manage health-related needs will improve Outcome: Progressing   Problem: Activity: Goal: Risk for activity intolerance will decrease Outcome: Progressing   Problem: Pain Managment: Goal: General experience of comfort will improve Outcome: Progressing   

## 2020-01-20 NOTE — TOC Initial Note (Signed)
Transition of Care [facility]) - Initial/Assessment Note    Patient Details  Name: Valerie Delgado MRN: [id number] Date of Birth: 09-23-1965  Transition of Care [facility]) CM/SW Contact:    Curlene Labrum, RN Phone Number: 01/20/2020, 10:37 AM  Clinical Narrative:                 Case management spoke with the patient at the bedside regarding transitions of care to home today.  The patient plans to discharge home with assistance with family - present in the room.  The patient was given choice regarding home health PT and patient did not have a preference.  I called [facility] and I'm waiting for return call to confirm home health PT.  The patient was given a Rw from the [employer] supply closet from the [address] supply closet.  Waiting for confirmation from home health company to establish [facility] PT before going home with family today.  Expected Discharge Plan: [place] Barriers to Discharge: No Barriers Identified (waiting for follow up to establish Home health PT)   Patient Goals and CMS Choice Patient states their goals for this hospitalization and ongoing recovery are:: Patient plans to discharge home with home health PT. CMS Medicare.gov Compare Post Acute Care list provided to:: Patient Choice offered to / list presented to : Patient  Expected Discharge Plan and Services Expected Discharge Plan: [place]   Discharge Planning Services: CM Consult Post Acute Care Choice: Durable Medical Equipment, Home Health Living arrangements for the past 2 months: Single Family Home                 DME Arranged: Walker rolling DME Agency: [employer] Date DME Agency Contacted: 01/20/20 Time DME Agency Contacted: [id number] Representative spoke with at DME Agency: Rolling walker delivered to room from supply closet on 5 [address].     Date [place]: 01/20/20 Time HH Agency Contacted: 1034 Representative spoke with at [place]: Drue Novel, [facility] - waiting for  return call to confirm [facility] PT  Prior Living Arrangements/Services Living arrangements for the past 2 months: Single Family Home Lives with:: Significant Other Patient language and need for interpreter reviewed:: Yes Do you feel safe going back to the place where you live?: Yes      Need for Family Participation in Patient Care: Yes (Comment) Care giver support system in place?: Yes (comment)   Criminal Activity/Legal Involvement Pertinent to Current Situation/Hospitalization: No - Comment as needed  Activities of Daily Living Home Assistive Devices/Equipment: None ADL Screening (condition at time of admission) Patient's cognitive ability adequate to safely complete daily activities?: Yes Is the patient deaf or have difficulty hearing?: No Does the patient have difficulty seeing, even when wearing glasses/contacts?: No Does the patient have difficulty concentrating, remembering, or making decisions?: No Patient able to express need for assistance with ADLs?: Yes Does the patient have difficulty dressing or bathing?: No Independently performs ADLs?: Yes (appropriate for developmental age) Does the patient have difficulty walking or climbing stairs?: Yes Weakness of Legs: None Weakness of Arms/Hands: None  Permission Sought/Granted Permission sought to share information with : Case Manager       Permission granted to share info w AGENCY: Home health for PT  Permission granted to share info w Relationship: significant other     Emotional Assessment Appearance:: Appears stated age Attitude/Demeanor/Rapport: Engaged Affect (typically observed): Accepting Orientation: : Oriented to Self, Oriented to Place, Oriented to  Time, Oriented  to Situation Alcohol / Substance Use: Not Applicable Psych Involvement: No (comment)  Admission diagnosis:  Status post total hip replacement, right [Y18.563] Patient Active Problem List   Diagnosis Date Noted  . Status post total hip replacement,  right 01/19/2020  . Unilateral primary osteoarthritis, right hip 09/11/2019  . Decreased visual acuity 03/30/2017  . Estrogen deficiency 03/30/2017  . Inflammatory arthritis 11/13/2016  . Essential hypertension 09/19/2016  . History of anxiety and depression 09/19/2016  . History of Meniere's disease 09/19/2016  . History of hearing loss 09/19/2016  . History of ADHD 09/19/2016  . Dyspareunia, female 01/11/2016  . Routine general medical examination at a health care facility 01/11/2016  . Smoker 09/01/2015  . Anxiety and depression 09/01/2015  . Positive QuantiFERON-TB Gold test 09/01/2015  . History of herpes simplex infection 09/01/2015   PCP:  Glenis Smoker, MD Pharmacy:   [facility] [place], [address] [place] [address] Phone: [phone] Fax: [phone]  [facility]) - [place], [place] [address] Phone: [phone] Fax: [phone]     Social Determinants of Health (SDOH) Interventions    Readmission Risk Interventions No flowsheet data found.

## 2020-01-26 ENCOUNTER — Telehealth: Payer: Self-pay

## 2020-01-26 NOTE — Telephone Encounter (Signed)
Valerie Delgado, PT with [facility] would like verbal orders for 3 x week for 1 week and 2 x week for 1 week.  Cb# [phone].  Please advise.  Thank you.

## 2020-01-26 NOTE — Telephone Encounter (Signed)
Phone line rings busy each time called.

## 2020-01-26 NOTE — Telephone Encounter (Signed)
Ok thank you 

## 2020-01-26 NOTE — Telephone Encounter (Signed)
Please advise 

## 2020-01-26 NOTE — Telephone Encounter (Signed)
I tried to call, busy signal.

## 2020-01-27 NOTE — Telephone Encounter (Signed)
I called, phone line rings busy.

## 2020-01-27 NOTE — Telephone Encounter (Signed)
I called Dorian at [phone] and advised.

## 2020-01-27 NOTE — Discharge Summary (Signed)
Patient ID: Valerie Delgado MRN: [id number] DOB/AGE: Dec 26, 1965 54 y.o.  Admit date: 01/19/2020 Discharge date: 01/20/2020 Admission Diagnoses:  Active Problems:   Status post total hip replacement, right   Discharge Diagnoses:  Active Problems:   Status post total hip replacement, right  status post Procedure(s): RIGHT TOTAL HIP ARTHROPLASTY-DIRECT ANTERIOR  Past Medical History:  Diagnosis Date  . Allergy   . Anxiety   . Arthritis   . Depression   . Hypertension   . Meniere's disease of left ear   . Psoriatic arthritis ([place])    Dr. Estanislado Pandy  . Tuberculosis    Latent TB, started treatment on 08/30/2015    Surgeries: Procedure(s): RIGHT TOTAL HIP ARTHROPLASTY-DIRECT ANTERIOR on 01/19/2020   Consultants:   Discharged Condition: Improved  Hospital Course: Valerie Delgado is an 54 y.o. female who was admitted 01/19/2020 for operative treatment of right hip arthritis. Patient failed conservative treatments (please see the history and physical for the specifics) and had severe unremitting pain that affects sleep, daily activities and work/hobbies. After pre-op clearance, the patient was taken to the operating room on 01/19/2020 and underwent  Procedure(s): RIGHT TOTAL HIP ARTHROPLASTY-DIRECT ANTERIOR.    Patient was given perioperative antibiotics:  Anti-infectives (From admission, onward)   Start     Dose/Rate Route Frequency Ordered Stop   01/19/20 1949  valACYclovir (VALTREX) tablet 500 mg  Status:  Discontinued        500 mg Oral Daily PRN 01/19/20 1950 01/20/20 1705   01/19/20 1000  ceFAZolin (ANCEF) IVPB 2g/100 mL premix        2 g 200 mL/hr over 30 Minutes Intravenous On call to O.R. 01/19/20 0949 01/19/20 1301   01/19/20 0954  ceFAZolin (ANCEF) 2-4 GM/100ML-% IVPB       Note to Pharmacy: Therese Sarah   : cabinet override      01/19/20 0954 01/19/20 1233       Patient was given sequential compression devices and early ambulation to prevent DVT.   Patient  benefited maximally from hospital stay and there were no complications. At the time of discharge, the patient was urinating/moving their bowels without difficulty, tolerating a regular diet, pain is controlled with oral pain medications and they have been cleared by PT/OT.   Recent vital signs: No data found.   Recent laboratory studies: No results for input(s): WBC, HGB, HCT, PLT, NA, K, CL, CO2, BUN, CREATININE, GLUCOSE, INR, CALCIUM in the last 72 hours.  Invalid input(s): PT, 2   Discharge Medications:   Allergies as of 01/20/2020      Reactions   Otezla [apremilast]    Severe depression    Sulfur    Makes stomach upset      Medication List    STOP taking these medications   ibuprofen 200 MG tablet Commonly known as: ADVIL   traMADol 50 MG tablet Commonly known as: ULTRAM   Unisom Sleepgels 50 MG Caps Generic drug: diphenhydrAMINE HCl (Sleep)   Voltaren 1 % Gel Generic drug: diclofenac Sodium     TAKE these medications   Adderall 30 MG tablet Generic drug: amphetamine-dextroamphetamine Take 30 mg by mouth 2 (two) times daily as needed (ADHD).   ALPRAZolam 1 MG tablet Commonly known as: XANAX Take 1 mg by mouth 2 (two) times daily as needed for anxiety.   amLODipine 10 MG tablet Commonly known as: NORVASC Take 10 mg by mouth daily.   aspirin 325 MG EC tablet Take 1 tablet (325 mg total) by mouth  daily with breakfast. Must take at least 4 weeks postop for dvt prophylaxis   Cosentyx Sensoready (300 MG) 150 MG/ML So[place] Generic drug: Secukinumab (300 MG Dose) Inject 2 pens in the skin every 7 days for 5 weeks then 2 pens every 28 days. What changed:   how much to take  how to take this  when to take this  additional instructions   DULoxetine 60 MG capsule Commonly known as: CYMBALTA Take 120 mg by mouth daily.   ICY HOT EX Apply 1 application topically 4 (four) times daily as needed (pain.).   methocarbamol 500 MG tablet Commonly known as:  Robaxin Take 1 tablet (500 mg total) by mouth 4 (four) times daily.   multivitamin with minerals Tabs tablet Take 1 tablet by mouth daily.   oxyCODONE-acetaminophen 5-325 MG tablet Commonly known as: PERCOCET/ROXICET Take 1-2 tablets by mouth every 6 (six) hours as needed for severe pain.   pseudoephedrine 30 MG tablet Commonly known as: SUDAFED Take 30 mg by mouth every 4 (four) hours as needed (sinus headache.).   valACYclovir 500 MG tablet Commonly known as: VALTREX TAKE 1 TABLET(500 MG) BY MOUTH TWICE DAILY What changed: See the new instructions.       Diagnostic Studies: DG C-Arm 1-60 Min  Result Date: 01/19/2020 CLINICAL DATA:  RIGHT anterior hip EXAM: OPERATIVE RIGHT HIP (WITH PELVIS IF PERFORMED) 2 VIEWS TECHNIQUE: Fluoroscopic spot image(s) were submitted for interpretation post-operatively. COMPARISON:  None. FINDINGS: RIGHT hip total arthroplasty.  No complicating features IMPRESSION: RIGHT hip arthroplasty without complication RIGHT hip arthroplasty. Electronically Signed   By: Suzy Bouchard M.D.   On: 01/19/2020 14:16   DG HIP OPERATIVE UNILAT W OR W/O PELVIS RIGHT  Result Date: 01/19/2020 CLINICAL DATA:  RIGHT anterior hip EXAM: OPERATIVE RIGHT HIP (WITH PELVIS IF PERFORMED) 2 VIEWS TECHNIQUE: Fluoroscopic spot image(s) were submitted for interpretation post-operatively. COMPARISON:  None. FINDINGS: RIGHT hip total arthroplasty.  No complicating features IMPRESSION: RIGHT hip arthroplasty without complication RIGHT hip arthroplasty. Electronically Signed   By: Suzy Bouchard M.D.   On: 01/19/2020 14:16       Follow-up Information    Marybelle Killings, MD. Schedule an appointment as soon as possible for a visit today.   Specialty: Orthopedic Surgery Why: need return office visit 2 weeks postop Contact information: [place] [address] [place], [place] [facility] Follow up.   Why: A rolling walker was  delivered to your hospital room to take home. Contact information: [address] [place] [address] [phone]        Glenis Smoker, MD. Schedule an appointment as soon as possible for a visit.   Specialty: Family Medicine Why: Please follow up in the next 7-10 days for a hospital follow up. Contact information: [place] [address] [phone]        [facility] Follow up.   Specialty: [place] Why: [facility] will be providing you with physical therapy.  They will call you in the next 24-48 hours to set you up with therapy sessions at home. Contact information: [address] [place] [address] [phone]               Discharge Plan:  discharge to home  Disposition:     Signed: Benjiman Core  01/27/2020, 2:08 PM

## 2020-01-30 ENCOUNTER — Other Ambulatory Visit: Payer: Self-pay | Admitting: Rheumatology

## 2020-01-30 DIAGNOSIS — L4050 Arthropathic psoriasis, unspecified: Secondary | ICD-10-CM

## 2020-01-30 NOTE — Progress Notes (Deleted)
Office Visit Note  Patient: Valerie Delgado             Date of Birth: May 08, 1965           MRN: [id number]             PCP: Glenis Smoker, MD Referring: Glenis Smoker, * Visit Date: 02/05/2020 Occupation: @GUAROCC @  Subjective:    History of Present Illness: Valerie Delgado is a 54 y.o. female  With history of psoriatic arthritis, osteoarthritis, and DDD.  She was started on cosentyx on 11/05/19.   CXR 07/31/19: No active cardiopulmonary disease. No evidence of active TB infection.  Activities of Daily Living:  Patient reports morning stiffness for    {minute/hour:19697}.   Patient {ACTIONS;DENIES/REPORTS:21021675::"Denies"} nocturnal pain.  Difficulty dressing/grooming: {ACTIONS;DENIES/REPORTS:21021675::"Denies"} Difficulty climbing stairs: {ACTIONS;DENIES/REPORTS:21021675::"Denies"} Difficulty getting out of chair: {ACTIONS;DENIES/REPORTS:21021675::"Denies"} Difficulty using hands for taps, buttons, cutlery, and/or writing: {ACTIONS;DENIES/REPORTS:21021675::"Denies"}  Review of Systems  Constitutional: Negative for fatigue.  HENT: Negative for mouth sores, mouth dryness and nose dryness.   Eyes: Negative for pain, visual disturbance and dryness.  Respiratory: Negative for cough, hemoptysis, shortness of breath and difficulty breathing.   Cardiovascular: Negative for chest pain, palpitations, hypertension and swelling in legs/feet.  Gastrointestinal: Negative for blood in stool, constipation and diarrhea.  Endocrine: Negative for increased urination.  Genitourinary: Negative for painful urination.  Musculoskeletal: Negative for arthralgias, joint pain, joint swelling, myalgias, muscle weakness, morning stiffness, muscle tenderness and myalgias.  Skin: Negative for color change, pallor, rash, hair loss, nodules/bumps, skin tightness, ulcers and sensitivity to sunlight.  Allergic/Immunologic: Negative for susceptible to infections.  Neurological: Negative for dizziness,  numbness, headaches and weakness.  Hematological: Negative for swollen glands.  Psychiatric/Behavioral: Negative for depressed mood and sleep disturbance. The patient is not nervous/anxious.     PMFS History:  Patient Active Problem List   Diagnosis Date Noted  . Status post total hip replacement, right 01/19/2020  . Unilateral primary osteoarthritis, right hip 09/11/2019  . Decreased visual acuity 03/30/2017  . Estrogen deficiency 03/30/2017  . Inflammatory arthritis 11/13/2016  . Essential hypertension 09/19/2016  . History of anxiety and depression 09/19/2016  . History of Meniere's disease 09/19/2016  . History of hearing loss 09/19/2016  . History of ADHD 09/19/2016  . Dyspareunia, female 01/11/2016  . Routine general medical examination at a health care facility 01/11/2016  . Smoker 09/01/2015  . Anxiety and depression 09/01/2015  . Positive QuantiFERON-TB Gold test 09/01/2015  . History of herpes simplex infection 09/01/2015    Past Medical History:  Diagnosis Date  . Allergy   . Anxiety   . Arthritis   . Depression   . Hypertension   . Meniere's disease of left ear   . Psoriatic arthritis ([place])    Dr. Estanislado Pandy  . Tuberculosis    Latent TB, started treatment on 08/30/2015    Family History  Problem Relation Age of Onset  . Hypertension Mother   . Cancer Mother        breast  . Arthritis Mother   . Depression Mother   . Hypertension Father   . Hypertension Brother   . Stroke Brother   . Colon cancer Neg Hx   . Esophageal cancer Neg Hx   . Rectal cancer Neg Hx   . Stomach cancer Neg Hx    Past Surgical History:  Procedure Laterality Date  . CESAREAN SECTION    . COLONOSCOPY    . MOUTH SURGERY  2020   tooth extraction/ bone  graft   . TOTAL HIP ARTHROPLASTY Right 01/19/2020   Procedure: RIGHT TOTAL HIP ARTHROPLASTY-DIRECT ANTERIOR;  Surgeon: Marybelle Killings, MD;  Location: [facility];  Service: Orthopedics;  Laterality: Right;   Social History   Social  History Narrative  . Not on file   Immunization History  Administered Date(s) Administered  . Influenza,inj,Quad PF,6+ Mos 01/11/2016, 03/30/2017  . Influenza-Unspecified 01/23/2018  . Tdap 08/21/2014     Objective: Vital Signs: There were no vitals taken for this visit.   Physical Exam Vitals and nursing note reviewed.  Constitutional:      Appearance: She is well-developed and well-nourished.  HENT:     Head: Normocephalic and atraumatic.  Eyes:     Extraocular Movements: EOM normal.     Conjunctiva/sclera: Conjunctivae normal.  Cardiovascular:     Pulses: Intact distal pulses.  Pulmonary:     Effort: Pulmonary effort is normal.  Abdominal:     Palpations: Abdomen is soft.  Musculoskeletal:     Cervical back: Normal range of motion.  Skin:    General: Skin is warm and dry.     Capillary Refill: Capillary refill takes less than 2 seconds.  Neurological:     Mental Status: She is alert and oriented to person, place, and time.  Psychiatric:        Mood and Affect: Mood and affect normal.        Behavior: Behavior normal.      Musculoskeletal Exam:   CDAI Exam: CDAI Score: -- Patient Global: --; Provider Global: -- Swollen: --; Tender: -- Joint Exam 02/05/2020   No joint exam has been documented for this visit   There is currently no information documented on the homunculus. Go to the Rheumatology activity and complete the homunculus joint exam.  Investigation: No additional findings.  Imaging: DG C-Arm 1-60 Min  Result Date: 01/19/2020 CLINICAL DATA:  RIGHT anterior hip EXAM: OPERATIVE RIGHT HIP (WITH PELVIS IF PERFORMED) 2 VIEWS TECHNIQUE: Fluoroscopic spot image(s) were submitted for interpretation post-operatively. COMPARISON:  None. FINDINGS: RIGHT hip total arthroplasty.  No complicating features IMPRESSION: RIGHT hip arthroplasty without complication RIGHT hip arthroplasty. Electronically Signed   By: Suzy Bouchard M.D.   On: 01/19/2020 14:16   DG  HIP OPERATIVE UNILAT W OR W/O PELVIS RIGHT  Result Date: 01/19/2020 CLINICAL DATA:  RIGHT anterior hip EXAM: OPERATIVE RIGHT HIP (WITH PELVIS IF PERFORMED) 2 VIEWS TECHNIQUE: Fluoroscopic spot image(s) were submitted for interpretation post-operatively. COMPARISON:  None. FINDINGS: RIGHT hip total arthroplasty.  No complicating features IMPRESSION: RIGHT hip arthroplasty without complication RIGHT hip arthroplasty. Electronically Signed   By: Suzy Bouchard M.D.   On: 01/19/2020 14:16   XR HIP UNILAT W OR W/O PELVIS 2-3 VIEWS RIGHT  Result Date: 02/03/2020 Standing AP x-ray frog-leg right hip shows good position alignment total hip arthroplasty.  Opposite hip shows severe degenerative changes she is slightly shorter on the left than right by 8 to 10 mm. Impression: Satisfactory right total hip arthroplasty.   Recent Labs: Lab Results  Component Value Date   WBC 15.8 (H) 01/20/2020   HGB 11.0 (L) 01/20/2020   PLT 304 01/20/2020   NA 138 01/16/2020   K 3.9 01/16/2020   CL 101 01/16/2020   CO2 28 01/16/2020   GLUCOSE 110 (H) 01/16/2020   BUN 12 01/16/2020   CREATININE 0.65 01/16/2020   BILITOT 0.9 12/15/2019   ALKPHOS 68 12/15/2019   AST 17 12/15/2019   ALT 18 12/15/2019   PROT 8.0  12/15/2019   ALBUMIN 4.3 12/15/2019   CALCIUM 9.4 01/16/2020   GFRAA 112 07/29/2019    Speciality Comments: No specialty comments available.  Procedures:  No procedures performed Allergies: Otezla [apremilast] and Sulfur   Assessment / Plan:     Visit Diagnoses: Psoriatic arthritis ([place])  High risk medication use - Started on cosentyx on 11/05/19  Primary osteoarthritis of both knees  Primary osteoarthritis of both feet  DDD (degenerative disc disease), lumbar  ANA positive  Positive QuantiFERON-TB Gold test  History of Meniere's disease  History of ADHD  Essential hypertension  History of herpes simplex infection  Smoker  Orders: No orders of the defined types were placed in  this encounter.  No orders of the defined types were placed in this encounter.     Follow-Up Instructions: No follow-ups on file.   Ofilia Neas, PA-C  Note - This record has been created using Dragon software.  Chart creation errors have been sought, but may not always  have been located. Such creation errors do not reflect on  the standard of medical care.

## 2020-01-30 NOTE — Telephone Encounter (Signed)
Last Visit: 09/08/2019 Next Visit: 02/05/2020 Labs: 01/20/2020 CBC: WBC 15.8, RBC 3.61, Hgb 11.0, Hct 33.0, Glucose 110 TB Gold: Chest x-ray on July 31, 2019 was within normal limits.  Current Dose per office note 11/05/2019: Cosentyx 300 mg every 28 days   DX: psoriatic arthritis   Okay to refill Cosentyx?

## 2020-02-03 ENCOUNTER — Encounter: Payer: Self-pay | Admitting: Orthopaedic Surgery

## 2020-02-03 ENCOUNTER — Other Ambulatory Visit: Payer: Self-pay

## 2020-02-03 ENCOUNTER — Ambulatory Visit (INDEPENDENT_AMBULATORY_CARE_PROVIDER_SITE_OTHER): Payer: 59 | Admitting: Orthopaedic Surgery

## 2020-02-03 ENCOUNTER — Ambulatory Visit (INDEPENDENT_AMBULATORY_CARE_PROVIDER_SITE_OTHER): Payer: 59

## 2020-02-03 VITALS — BP 139/85 | HR 96

## 2020-02-03 DIAGNOSIS — M1611 Unilateral primary osteoarthritis, right hip: Secondary | ICD-10-CM

## 2020-02-03 MED ORDER — OXYCODONE-ACETAMINOPHEN 5-325 MG PO TABS
1.0000 | ORAL_TABLET | Freq: Four times a day (QID) | ORAL | 0 refills | Status: DC | PRN
Start: 2020-02-03 — End: 2020-03-31

## 2020-02-03 NOTE — Progress Notes (Signed)
Post-Op Visit Note   Patient: Valerie Delgado           Date of Birth: November 11, 1965           MRN: [id number] Visit Date: 02/03/2020 PCP: Glenis Smoker, MD   Assessment & Plan: Post right total of arthroplasty.  Staples removed Steri-Strips applied.  Work slip given no work times 4 weeks.  Percocet renewed 40 tablets.  Should gradually wean off this medication.  Recheck 3 weeks.  Chief Complaint:  Chief Complaint  Patient presents with  . Right Hip - Routine Post Op   Visit Diagnoses:  1. Unilateral primary osteoarthritis, right hip     Plan: X-ray showed good position alignment.  He is having increased problems of the opposite left hip which also has severe hip osteoarthritis.  Work slip given no work x4 weeks recheck 3 weeks.  Follow-Up Instructions: No follow-ups on file.   Orders:  Orders Placed This Encounter  Procedures  . XR HIP UNILAT W OR W/O PELVIS 2-3 VIEWS RIGHT   No orders of the defined types were placed in this encounter.   Imaging: XR HIP UNILAT W OR W/O PELVIS 2-3 VIEWS RIGHT  Result Date: 02/03/2020 Standing AP x-ray frog-leg right hip shows good position alignment total hip arthroplasty.  Opposite hip shows severe degenerative changes she is slightly shorter on the left than right by 8 to 10 mm. Impression: Satisfactory right total hip arthroplasty.   PMFS History: Patient Active Problem List   Diagnosis Date Noted  . Status post total hip replacement, right 01/19/2020  . Unilateral primary osteoarthritis, right hip 09/11/2019  . Decreased visual acuity 03/30/2017  . Estrogen deficiency 03/30/2017  . Inflammatory arthritis 11/13/2016  . Essential hypertension 09/19/2016  . History of anxiety and depression 09/19/2016  . History of Meniere's disease 09/19/2016  . History of hearing loss 09/19/2016  . History of ADHD 09/19/2016  . Dyspareunia, female 01/11/2016  . Routine general medical examination at a health care facility 01/11/2016  .  Smoker 09/01/2015  . Anxiety and depression 09/01/2015  . Positive QuantiFERON-TB Gold test 09/01/2015  . History of herpes simplex infection 09/01/2015   Past Medical History:  Diagnosis Date  . Allergy   . Anxiety   . Arthritis   . Depression   . Hypertension   . Meniere's disease of left ear   . Psoriatic arthritis ([place])    Dr. Estanislado Pandy  . Tuberculosis    Latent TB, started treatment on 08/30/2015    Family History  Problem Relation Age of Onset  . Hypertension Mother   . Cancer Mother        breast  . Arthritis Mother   . Depression Mother   . Hypertension Father   . Hypertension Brother   . Stroke Brother   . Colon cancer Neg Hx   . Esophageal cancer Neg Hx   . Rectal cancer Neg Hx   . Stomach cancer Neg Hx     Past Surgical History:  Procedure Laterality Date  . CESAREAN SECTION    . COLONOSCOPY    . MOUTH SURGERY  2020   tooth extraction/ bone graft   . TOTAL HIP ARTHROPLASTY Right 01/19/2020   Procedure: RIGHT TOTAL HIP ARTHROPLASTY-DIRECT ANTERIOR;  Surgeon: Marybelle Killings, MD;  Location: [facility];  Service: Orthopedics;  Laterality: Right;   Social History   Occupational History  . Not on file  Tobacco Use  . Smoking status: Former Smoker  Years: 30.00  . Smokeless tobacco: Never Used  Vaping Use  . Vaping Use: Never used  Substance and Sexual Activity  . Alcohol use: Yes    Comment: occ  . Drug use: No  . Sexual activity: Not Currently    Comment: lives with her mother. 1 child age 71.

## 2020-02-05 ENCOUNTER — Ambulatory Visit: Payer: 59 | Admitting: Physician Assistant

## 2020-02-05 DIAGNOSIS — F17200 Nicotine dependence, unspecified, uncomplicated: Secondary | ICD-10-CM

## 2020-02-05 DIAGNOSIS — R7612 Nonspecific reaction to cell mediated immunity measurement of gamma interferon antigen response without active tuberculosis: Secondary | ICD-10-CM

## 2020-02-05 DIAGNOSIS — Z79899 Other long term (current) drug therapy: Secondary | ICD-10-CM

## 2020-02-05 DIAGNOSIS — M19071 Primary osteoarthritis, right ankle and foot: Secondary | ICD-10-CM

## 2020-02-05 DIAGNOSIS — Z8619 Personal history of other infectious and parasitic diseases: Secondary | ICD-10-CM

## 2020-02-05 DIAGNOSIS — L4050 Arthropathic psoriasis, unspecified: Secondary | ICD-10-CM

## 2020-02-05 DIAGNOSIS — Z8659 Personal history of other mental and behavioral disorders: Secondary | ICD-10-CM

## 2020-02-05 DIAGNOSIS — M5136 Other intervertebral disc degeneration, lumbar region: Secondary | ICD-10-CM

## 2020-02-05 DIAGNOSIS — Z8669 Personal history of other diseases of the nervous system and sense organs: Secondary | ICD-10-CM

## 2020-02-05 DIAGNOSIS — R768 Other specified abnormal immunological findings in serum: Secondary | ICD-10-CM

## 2020-02-05 DIAGNOSIS — I10 Essential (primary) hypertension: Secondary | ICD-10-CM

## 2020-02-05 DIAGNOSIS — M170 Bilateral primary osteoarthritis of knee: Secondary | ICD-10-CM

## 2020-02-24 ENCOUNTER — Other Ambulatory Visit: Payer: Self-pay

## 2020-02-24 ENCOUNTER — Encounter: Payer: Self-pay | Admitting: Orthopaedic Surgery

## 2020-02-24 ENCOUNTER — Ambulatory Visit: Payer: 59 | Admitting: Orthopaedic Surgery

## 2020-02-24 VITALS — BP 160/100 | HR 108 | Ht 66.5 in | Wt 160.0 lb

## 2020-02-24 DIAGNOSIS — M1612 Unilateral primary osteoarthritis, left hip: Secondary | ICD-10-CM | POA: Insufficient documentation

## 2020-02-24 DIAGNOSIS — Z96641 Presence of right artificial hip joint: Secondary | ICD-10-CM

## 2020-02-24 NOTE — Progress Notes (Signed)
Post-Op Visit Note   Patient: Valerie Delgado           Date of Birth: [birth year]           MRN: [id number] Visit Date: 02/24/2020 PCP: Shon Hale, MD   Assessment & Plan: Follow-up right total hip arthroplasty 01/19/2020.  Incision looks good she will apply lotion.  She is ready to resume work activities today work slip given.  I will recheck her in 2 months.  Opposite left hip shows severe hip osteoarthritis and she will require total of arthroplasty at some point on the left.  She is happy with the results of her right hip procedure.  Chief Complaint:  Chief Complaint  Patient presents with  . Right Hip - Follow-up    01/19/2020 Right THA   Visit Diagnoses:  1. Status post total hip replacement, right   2. Unilateral primary osteoarthritis, left hip     Plan: Return visit 2 months.  Work slip given for work resumption.  Follow-Up Instructions: No follow-ups on file.   Orders:  No orders of the defined types were placed in this encounter.  No orders of the defined types were placed in this encounter.   Imaging: No results found.  PMFS History: Patient Active Problem List   Diagnosis Date Noted  . Unilateral primary osteoarthritis, left hip 02/24/2020  . Status post total hip replacement, right 01/19/2020  . Decreased visual acuity 03/30/2017  . Estrogen deficiency 03/30/2017  . Inflammatory arthritis 11/13/2016  . Essential hypertension 09/19/2016  . History of anxiety and depression 09/19/2016  . History of Meniere's disease 09/19/2016  . History of hearing loss 09/19/2016  . History of ADHD 09/19/2016  . Dyspareunia, female 01/11/2016  . Routine general medical examination at a health care facility 01/11/2016  . Smoker 09/01/2015  . Anxiety and depression 09/01/2015  . Positive QuantiFERON-TB Gold test 09/01/2015  . History of herpes simplex infection 09/01/2015   Past Medical History:  Diagnosis Date  . Allergy   . Anxiety   . Arthritis   .  Depression   . Hypertension   . Meniere's disease of left ear   . Psoriatic arthritis ([facility])    Dr. Corliss Skains  . Tuberculosis    Latent TB, started treatment on 08/30/2015    Family History  Problem Relation Age of Onset  . Hypertension Mother   . Cancer Mother        breast  . Arthritis Mother   . Depression Mother   . Hypertension Father   . Hypertension Brother   . Stroke Brother   . Colon cancer Neg Hx   . Esophageal cancer Neg Hx   . Rectal cancer Neg Hx   . Stomach cancer Neg Hx     Past Surgical History:  Procedure Laterality Date  . CESAREAN SECTION    . COLONOSCOPY    . MOUTH SURGERY  2020   tooth extraction/ bone graft   . TOTAL HIP ARTHROPLASTY Right 01/19/2020   Procedure: RIGHT TOTAL HIP ARTHROPLASTY-DIRECT ANTERIOR;  Surgeon: Eldred Manges, MD;  Location: [facility];  Service: Orthopedics;  Laterality: Right;   Social History   Occupational History  . Not on file  Tobacco Use  . Smoking status: Former Smoker    Years: 30.00  . Smokeless tobacco: Never Used  Vaping Use  . Vaping Use: Never used  Substance and Sexual Activity  . Alcohol use: Yes    Comment: occ  . Drug use: No  .  Sexual activity: Not Currently    Comment: lives with her mother. 1 child age 15.

## 2020-03-12 ENCOUNTER — Telehealth: Payer: Self-pay

## 2020-03-12 DIAGNOSIS — L4050 Arthropathic psoriasis, unspecified: Secondary | ICD-10-CM

## 2020-03-12 MED ORDER — COSENTYX SENSOREADY (300 MG) 150 MG/ML ~~LOC~~ SOAJ: 300.0000 mg | SUBCUTANEOUS | 2 refills | Status: DC

## 2020-03-12 NOTE — Telephone Encounter (Signed)
Valerie Delgado from [facility] left a voicemail stating they are trying to get a new prescription of Cosentyx 150 mg pen for the patient.  There was a prescription on file with [facility] and due to insurance changes the medication needs to be filled through [facility] now.  We have been unable to reach [facility] to get the prescription transferred.  Please call or fax the new prescription so we can send it to the patient.  Phone [phone]  Fax [phone]

## 2020-03-12 NOTE — Telephone Encounter (Signed)
Please schedule patient for a follow up visit. Patient due December 2021. Thanks!  

## 2020-03-12 NOTE — Telephone Encounter (Signed)
Last Visit: 09/08/2019 Next Visit: was due December 2021. Message sent to the front to schedule patient  Labs: 01/20/2020 CBC: WBC 15.8, RBC 3.61, Hgb 11.0, Hct 33.0, Glucose 110 TB Gold: Chest x-ray on July 31, 2019 was within normal limits.  Current Dose per office note 11/05/2019: Cosentyx 300 mg every 28 days   DX: psoriatic arthritis   Prescription sent to the new pharmacy.

## 2020-03-22 NOTE — Progress Notes (Signed)
Office Visit Note  Patient: Valerie Delgado             Date of Birth: [birth year]           MRN: [id number]             PCP: Glenis Smoker, MD Referring: Glenis Smoker, * Visit Date: 03/31/2020 Occupation: @GUAROCC @  Subjective:  Pain in both wrist joints   History of Present Illness: Collen Delgado is a 55 y.o. female arthritis, osteoarthritis, and DDD.  Patient is currently on Cosentyx 300 mg sq injections every month.  She is due for her monthly Cosentyx injection but requires updated lab work and a refill of Cosentyx.  She had a right hip total arthroplasty performed on January 19, 2020 by Dr. Lorin Delgado and is doing well and did not have any complications.  She continues to have chronic pain in the left hip and is planning to have surgery within the next few months.  Patient reports that she did not have to miss any doses of Cosentyx due to the timing of her surgery.  She reports that about 7 to 10 days prior to her Cosentyx injections she develops increased pain, stiffness, and inflammation in her hands and wrist joints.  She is currently having pain in both wrists and both CMC joints.  She has swelling in the left wrist.  She denies any achilles tendonitis or plantar fasciitis.  She continues to have chronic lower back pain.  She denies any active psoriasis.     Activities of Daily Living:  Patient reports morning stiffness for 1 hour.   Patient Denies nocturnal pain.  Difficulty dressing/grooming: Reports Difficulty climbing stairs: Reports Difficulty getting out of chair: Denies Difficulty using hands for taps, buttons, cutlery, and/or writing: Reports  Review of Systems  Constitutional: Positive for fatigue.  HENT: Positive for mouth dryness.   Eyes: Negative for dryness.  Respiratory: Negative for shortness of breath.   Cardiovascular: Negative for swelling in legs/feet.  Gastrointestinal: Negative for constipation.  Endocrine: Positive for heat intolerance and  excessive thirst.  Genitourinary: Negative for difficulty urinating.  Musculoskeletal: Positive for arthralgias, gait problem, joint pain, joint swelling, muscle weakness and morning stiffness.  Skin: Negative for rash.  Allergic/Immunologic: Negative for susceptible to infections.  Neurological: Positive for numbness and weakness.  Hematological: Negative for bruising/bleeding tendency.  Psychiatric/Behavioral: Positive for sleep disturbance.    PMFS History:  Patient Active Problem List   Diagnosis Date Noted  . Unilateral primary osteoarthritis, left hip 02/24/2020  . Status post total hip replacement, right 01/19/2020  . Decreased visual acuity 03/30/2017  . Estrogen deficiency 03/30/2017  . Inflammatory arthritis 11/13/2016  . Essential hypertension 09/19/2016  . History of anxiety and depression 09/19/2016  . History of Meniere's disease 09/19/2016  . History of hearing loss 09/19/2016  . History of ADHD 09/19/2016  . Dyspareunia, female 01/11/2016  . Routine general medical examination at a health care facility 01/11/2016  . Smoker 09/01/2015  . Anxiety and depression 09/01/2015  . Positive QuantiFERON-TB Gold test 09/01/2015  . History of herpes simplex infection 09/01/2015    Past Medical History:  Diagnosis Date  . Allergy   . Anxiety   . Arthritis   . Depression   . Hypertension   . Meniere's disease of left ear   . Psoriatic arthritis ([place])    Dr. Estanislado Pandy  . Tuberculosis    Latent TB, started treatment on 08/30/2015    Family History  Problem Relation  Age of Onset  . Hypertension Mother   . Cancer Mother        breast  . Arthritis Mother   . Depression Mother   . Hypertension Father   . Hypertension Brother   . Stroke Brother   . Colon cancer Neg Hx   . Esophageal cancer Neg Hx   . Rectal cancer Neg Hx   . Stomach cancer Neg Hx    Past Surgical History:  Procedure Laterality Date  . CESAREAN SECTION    . COLONOSCOPY    . MOUTH SURGERY  2020    tooth extraction/ bone graft   . TOTAL HIP ARTHROPLASTY Right 01/19/2020   Procedure: RIGHT TOTAL HIP ARTHROPLASTY-DIRECT ANTERIOR;  Surgeon: Valerie Killings, MD;  Location: [place];  Service: Orthopedics;  Laterality: Right;   Social History   Social History Narrative  . Not on file   Immunization History  Administered Date(s) Administered  . Influenza,inj,Quad PF,6+ Mos 01/11/2016, 03/30/2017  . Influenza-Unspecified 01/23/2018  . PFIZER(Purple Top)SARS-COV-2 Vaccination 05/09/2019, 10/30/2019  . Tdap 08/21/2014     Objective: Vital Signs: BP (!) 164/99 (BP Location: Right Arm, Patient Position: Sitting, Cuff Size: Normal)   Pulse (!) 109   Resp 16   Ht 5\' 6"  (1.676 m)   Wt 161 lb (73 kg)   BMI 25.99 kg/m    Physical Exam Vitals and nursing note reviewed.  Constitutional:      Appearance: She is well-developed and well-nourished.  HENT:     Head: Normocephalic and atraumatic.  Eyes:     Extraocular Movements: EOM normal.     Conjunctiva/sclera: Conjunctivae normal.  Cardiovascular:     Pulses: Intact distal pulses.  Pulmonary:     Effort: Pulmonary effort is normal.  Abdominal:     Palpations: Abdomen is soft.  Musculoskeletal:     Cervical back: Normal range of motion.  Skin:    General: Skin is warm and dry.     Capillary Refill: Capillary refill takes less than 2 seconds.  Neurological:     Mental Status: She is alert and oriented to person, place, and time.  Psychiatric:        Mood and Affect: Mood and affect normal.        Behavior: Behavior normal.      Musculoskeletal Exam: C-spine, thoracic spine, and lumbar spine good ROM.  Shoulder joints and elbow joints good ROM with no discomfort.  Limited flexion of the left wrist.  Flexor tenosynovitis of the left wrist noted. DIP thickening and tenderness consistent with osteoarthritis and psoriatic arthritis overlap.  Thickening of both CMC joints.  Tenderness over the left CMC joint.  No tenderness or  synovitis of MCP joints.  Right hip replacement has good ROM with no discomfort.  Left hip painful ROM.  Knee joints good ROM with no warmth or effusion. Ankle joints good ROM with no tenderness or inflammation.   CDAI Exam: CDAI Score: -- Patient Global: --; Provider Global: -- Swollen: --; Tender: -- Joint Exam 03/31/2020   No joint exam has been documented for this visit   There is currently no information documented on the homunculus. Go to the Rheumatology activity and complete the homunculus joint exam.  Investigation: No additional findings.  Imaging: No results found.  Recent Labs: Lab Results  Component Value Date   WBC 15.8 (H) 01/20/2020   HGB 11.0 (L) 01/20/2020   PLT 304 01/20/2020   NA 138 01/16/2020   K 3.9 01/16/2020  CL 101 01/16/2020   CO2 28 01/16/2020   GLUCOSE 110 (H) 01/16/2020   BUN 12 01/16/2020   CREATININE 0.65 01/16/2020   BILITOT 0.9 12/15/2019   ALKPHOS 68 12/15/2019   AST 17 12/15/2019   ALT 18 12/15/2019   PROT 8.0 12/15/2019   ALBUMIN 4.3 12/15/2019   CALCIUM 9.4 01/16/2020   GFRAA 112 07/29/2019    Speciality Comments: No specialty comments available.  Procedures:  No procedures performed Allergies: Elemental sulfur and Otezla [apremilast]   Assessment / Plan:     Visit Diagnoses: Psoriatic arthritis ([place]) - Erosive psoriatic arthritis with radiographic progression between 09/2016-09/2019: She presents today with flexor tenosynovitis of the left wrist. She has tenderness over both wrist joints and bilateral CMC joints, L>R. She has DIP thickening and tenderness in bialteral hands.  She has no Achilles tendinitis or plantar fasciitis.  She has not had any active psoriasis.  She is not experiencing any SI joint discomfort at this time.  She is currently on Cosentyx 300 mg subcutaneous injections every 28 days-started on 11/05/19.  She has not missed any doses of cosentyx recently.  She underwent a right hip total arthroplasty on 40/98/11  without complication and reports she did not have to postpone her monthly cosentyx injection due to the timing of her surgery.  She has been experiencing increased breakthrough pain and intermittent inflammation about 7 to 10 days prior to her monthly Cosentyx injections.  We discussed spacing the dosing of Cosentyx to 150 mg sq injections every 14 days.  She is in agreement.  A refill of Cosentyx was sent to the pharmacy.  We discussed the importance of holding Cosentyx if she develops signs or symptoms of an infection.  She will follow-up in the office in 3 to 4 months to assess her response to the change in frequency of Cosentyx dosing.  Plan: DG Chest 2 View, Secukinumab, 300 MG Dose, (COSENTYX SENSOREADY, 300 MG,) 150 MG/ML SOAJ   High risk medication use - Cosentyx 150 mg sq injections every 14 days. She was initially started on cosentyx on 11/05/19. Discussed spacing the dose of cosentyx to 150 mg every 14 days since she has been experiencing increased breakthrough pain and inflammation 7-10 prior to her monthly cosentyx injections.  D/c otezla-worsening depression. D/c Enbrel due to recurrent infections.  CBC updated on 01/20/2020.  BMP updated on 01/16/2020.  She is due to update lab work.  Orders for CBC and CMP were released.  Her next lab work will be due in May and every 3 months to monitor for drug toxicity.  Standing orders for CBC and CMP were placed today.  She has history of positive TB Gold.  Chest x-ray was obtained on 08/05/2019 which did not reveal any signs of active TB.  Future order for CXR was placed today. - Plan: CBC with Differential/Platelet, COMPLETE METABOLIC PANEL WITH GFR, CBC with Differential/Platelet, COMPLETE METABOLIC PANEL WITH GFR, DG Chest 2 View She has not had any recent infections.  Discussed the importance of holding Cosentyx if she develops signs or symptoms of an infection and to resume once infection has completely cleared.  She voiced understanding.   Primary  osteoarthritis of both knees: She has good ROM of both knee joints with no discomfort.  No warmth or effusion of knee joints.   Primary osteoarthritis of both feet: Pes planus. She is not having any discomfort in her feet at this time.   No achilles tendonitis or plantar fasciitis.  DDD (degenerative disc disease), lumbar: Chronic pain. MRI of the lumbar spine on 09/01/19 revealed left greater than right foraminal stenosis at L5-S1 secondary to chronic bilateral L5 pars defects with grade 1 anterolisthesis.      S/P total right hip arthroplasty: Doing well. Performed by Dr. Lorin Delgado on 01/19/20. She has good ROM with no discomfort.    Primary osteoarthritis of left hip: X-rays on 09/10/19 revealed moderate to severe left hip osteoarthritis.  She has painful ROM of the left hip joint.  She is planning on scheduling a left hip replacement with Dr. Lorin Delgado within the next several months.   ANA positive: She has no features of systemic lupus.   Positive QuantiFERON-TB Gold test -CXR on 07/31/19 did not reveal any active cardiopulmonary disease.  No evidence of active TB infection. Future order for CXR was placed today.  Plan: DG Chest 2 View  Other medical conditions are listed as follows:   History of Meniere's disease  History of ADHD  Essential hypertension  History of herpes simplex infection  History of anxiety and depression  Orders: Orders Placed This Encounter  Procedures  . DG Chest 2 View  . CBC with Differential/Platelet  . COMPLETE METABOLIC PANEL WITH GFR  . CBC with Differential/Platelet  . COMPLETE METABOLIC PANEL WITH GFR   Meds ordered this encounter  Medications  . DISCONTD: Secukinumab, 300 MG Dose, (COSENTYX SENSOREADY, 300 MG,) 150 MG/ML SOAJ    Sig: Inject 300 mg as directed every 28 (twenty-eight) days.    Dispense:  2 mL    Refill:  2  . Secukinumab, 300 MG Dose, (COSENTYX SENSOREADY, 300 MG,) 150 MG/ML SOAJ    Sig: Inject 150 mg as directed every 14 (fourteen)  days.    Dispense:  2 mL    Refill:  2    Follow-Up Instructions: Return in about 4 months (around 07/29/2020) for Psoriatic arthritis, Osteoarthritis.   Ofilia Neas, PA-C  Note - This record has been created using Dragon software.  Chart creation errors have been sought, but may not always  have been located. Such creation errors do not reflect on  the standard of medical care.,

## 2020-03-31 ENCOUNTER — Other Ambulatory Visit: Payer: Self-pay

## 2020-03-31 ENCOUNTER — Encounter: Payer: Self-pay | Admitting: Physician Assistant

## 2020-03-31 ENCOUNTER — Ambulatory Visit: Payer: 59 | Admitting: Physician Assistant

## 2020-03-31 VITALS — BP 164/99 | HR 109 | Resp 16 | Ht 66.0 in | Wt 161.0 lb

## 2020-03-31 DIAGNOSIS — Z79899 Other long term (current) drug therapy: Secondary | ICD-10-CM | POA: Diagnosis not present

## 2020-03-31 DIAGNOSIS — I10 Essential (primary) hypertension: Secondary | ICD-10-CM

## 2020-03-31 DIAGNOSIS — M1612 Unilateral primary osteoarthritis, left hip: Secondary | ICD-10-CM

## 2020-03-31 DIAGNOSIS — M19071 Primary osteoarthritis, right ankle and foot: Secondary | ICD-10-CM | POA: Diagnosis not present

## 2020-03-31 DIAGNOSIS — L4050 Arthropathic psoriasis, unspecified: Secondary | ICD-10-CM

## 2020-03-31 DIAGNOSIS — Z8669 Personal history of other diseases of the nervous system and sense organs: Secondary | ICD-10-CM

## 2020-03-31 DIAGNOSIS — M170 Bilateral primary osteoarthritis of knee: Secondary | ICD-10-CM

## 2020-03-31 DIAGNOSIS — M5136 Other intervertebral disc degeneration, lumbar region: Secondary | ICD-10-CM

## 2020-03-31 DIAGNOSIS — Z8659 Personal history of other mental and behavioral disorders: Secondary | ICD-10-CM

## 2020-03-31 DIAGNOSIS — Z96641 Presence of right artificial hip joint: Secondary | ICD-10-CM

## 2020-03-31 DIAGNOSIS — Z8619 Personal history of other infectious and parasitic diseases: Secondary | ICD-10-CM

## 2020-03-31 DIAGNOSIS — M19072 Primary osteoarthritis, left ankle and foot: Secondary | ICD-10-CM

## 2020-03-31 DIAGNOSIS — R7612 Nonspecific reaction to cell mediated immunity measurement of gamma interferon antigen response without active tuberculosis: Secondary | ICD-10-CM

## 2020-03-31 DIAGNOSIS — R768 Other specified abnormal immunological findings in serum: Secondary | ICD-10-CM

## 2020-03-31 MED ORDER — COSENTYX SENSOREADY (300 MG) 150 MG/ML ~~LOC~~ SOAJ: 150.0000 mg | SUBCUTANEOUS | 2 refills | Status: DC

## 2020-03-31 MED ORDER — COSENTYX SENSOREADY (300 MG) 150 MG/ML ~~LOC~~ SOAJ: 300.0000 mg | SUBCUTANEOUS | 2 refills | Status: DC

## 2020-03-31 NOTE — Patient Instructions (Addendum)
[facility] joint brace     Standing Labs We placed an order today for your standing lab work.   Please have your standing labs drawn in May and every 3 months   If possible, please have your labs drawn 2 weeks prior to your appointment so that the provider can discuss your results at your appointment.  We have open lab daily Monday through Thursday from 1:30-4:30 PM and Friday from 1:30-4:00 PM at the office of Dr. Bo Merino, [facility].   Please be advised, all patients with office appointments requiring lab work will take precedents over walk-in lab work.  If possible, please come for your lab work on Monday and Friday afternoons, as you may experience shorter wait times. The office is located at [address] [place], [place], [place] [address] No appointment is necessary.   Labs are drawn by [facility]. Please bring your co-pay at the time of your lab draw.  You may receive a bill from [facility] for your lab work.  If you wish to have your labs drawn at another location, please call the office 24 hours in advance to send orders.  If you have any questions regarding directions or hours of operation,  please call [phone].   As a reminder, please drink plenty of water prior to coming for your lab work. Thanks!

## 2020-04-01 LAB — COMPLETE METABOLIC PANEL WITH GFR
AG Ratio: 1.5 (calc) (ref 1.0–2.5)
ALT: 13 U/L (ref 6–29)
AST: 12 U/L (ref 10–35)
Albumin: 4.2 g/dL (ref 3.6–5.1)
Alkaline phosphatase (APISO): 90 U/L (ref 37–153)
BUN: 17 mg/dL (ref 7–25)
CO2: 29 mmol/L (ref 20–32)
Calcium: 9.6 mg/dL (ref 8.6–10.4)
Chloride: 104 mmol/L (ref 98–110)
Creat: 0.63 mg/dL (ref 0.50–1.05)
GFR, Est African American: 116 mL/min/{1.73_m2} (ref >=60)
GFR, Est Non African American: 100 mL/min/{1.73_m2} (ref >=60)
Globulin: 2.8 g/dL (calc) (ref 1.9–3.7)
Glucose, Bld: 97 mg/dL (ref 65–99)
Potassium: 4.8 mmol/L (ref 3.5–5.3)
Sodium: 140 mmol/L (ref 135–146)
Total Bilirubin: 0.3 mg/dL (ref 0.2–1.2)
Total Protein: 7 g/dL (ref 6.1–8.1)

## 2020-04-01 LAB — CBC WITH DIFFERENTIAL/PLATELET
Absolute Monocytes: 785 cells/uL (ref 200–950)
Basophils Absolute: 65 cells/uL (ref 0–200)
Basophils Relative: 0.6 %
Eosinophils Absolute: 185 cells/uL (ref 15–500)
Eosinophils Relative: 1.7 %
HCT: 44.3 % (ref 35.0–45.0)
Hemoglobin: 14.5 g/dL (ref 11.7–15.5)
Lymphs Abs: 2976 cells/uL (ref 850–3900)
MCH: 29.7 pg (ref 27.0–33.0)
MCHC: 32.7 g/dL (ref 32.0–36.0)
MCV: 90.8 fL (ref 80.0–100.0)
MPV: 11.5 fL (ref 7.5–12.5)
Monocytes Relative: 7.2 %
Neutro Abs: 6889 cells/uL (ref 1500–7800)
Neutrophils Relative %: 63.2 %
Platelets: 394 10*3/uL (ref 140–400)
RBC: 4.88 10*6/uL (ref 3.80–5.10)
RDW: 12.6 % (ref 11.0–15.0)
Total Lymphocyte: 27.3 %
WBC: 10.9 10*3/uL — ABNORMAL HIGH (ref 3.8–10.8)

## 2020-04-01 NOTE — Progress Notes (Signed)
WBC count is borderline elevated. Rest of CBC WNL. CMP WNL.

## 2020-06-18 ENCOUNTER — Other Ambulatory Visit: Payer: Self-pay | Admitting: Physician Assistant

## 2020-06-18 DIAGNOSIS — L4050 Arthropathic psoriasis, unspecified: Secondary | ICD-10-CM

## 2020-06-18 NOTE — Telephone Encounter (Signed)
Next Visit: due June 2022. Message sent to the front desk to schedule.   Last Visit: 03/31/2020  Last Fill: 03/31/2020   TU:[id number] arthritis  Current Dose per office note on 03/31/2020: Cosentyx 150 mg sq injections every 14 days.  Labs: 03/31/2020: WBC count is borderline elevated. Rest of CBC WNL. CMP WNL.   TB Gold: history of positive TB Gold.  Chest x-ray: 08/11/2019 No active cardiopulmonary disease. No evidence of active TB infection.  Okay to refill cosentyx?

## 2020-06-29 ENCOUNTER — Telehealth: Payer: Self-pay

## 2020-06-29 NOTE — Telephone Encounter (Signed)
I called patient, patient is injecting every 14 days, I called [employer], medication shipped 06/28/2020

## 2020-06-29 NOTE — Telephone Encounter (Signed)
Denton Ar, Pharmacy Technician with [facility] left a voicemail regarding patient's Cosentyx 150 mg pen kit.  We are calling to confirm the dose change.  Please call back at [phone]

## 2020-08-04 NOTE — Progress Notes (Signed)
Office Visit Note  Patient: Valerie Delgado             Date of Birth: 05/10/1965           MRN: [id number]             PCP: Glenis Smoker, MD Referring: Glenis Smoker, * Visit Date: 08/18/2020 Occupation: @GUAROCC @  Subjective:  Joint stiffness   History of Present Illness: Valerie Delgado is a 55 y.o. female with history of psoriatic arthritis and osteoarthritis.  Patient remains on Cosentyx 300 mg sq injections every month.  According to the patient she has not tried spacing the dosing of Cosentyx due to issues with her pharmacy.  She states that she notices increased pain and stiffness on week 3 leading up to her Cosentyx injection.  She was also noticed a few small patches of psoriasis develop.  She denies any Achilles tendinitis or plantar fasciitis.  She continues to have chronic left hip pain and occasional SI joint discomfort bilaterally.  According to the patient she feels that stress exacerbates her pain and stiffness.  She states that her psychiatrist would like to try her on Abilify but she has not started that yet. She denies any recent infections.    Activities of Daily Living:  Patient reports morning stiffness for several  hours.   Patient Reports nocturnal pain.  Difficulty dressing/grooming: Reports Difficulty climbing stairs: Reports Difficulty getting out of chair: Reports Difficulty using hands for taps, buttons, cutlery, and/or writing: Reports  Review of Systems  Constitutional:  Positive for fatigue.  HENT:  Positive for mouth dryness. Negative for mouth sores and nose dryness.   Eyes:  Positive for itching. Negative for pain and dryness.  Respiratory:  Negative for shortness of breath and difficulty breathing.   Cardiovascular:  Negative for chest pain and palpitations.  Gastrointestinal:  Negative for blood in stool, constipation and diarrhea.  Endocrine: Negative for increased urination.  Genitourinary:  Negative for difficulty urinating.   Musculoskeletal:  Positive for joint pain, joint pain, joint swelling and morning stiffness. Negative for myalgias, muscle tenderness and myalgias.  Skin:  Positive for rash. Negative for color change.  Allergic/Immunologic: Negative for susceptible to infections.  Neurological:  Positive for headaches and memory loss. Negative for dizziness and numbness.  Hematological:  Positive for bruising/bleeding tendency.  Psychiatric/Behavioral:  Positive for confusion.    PMFS History:  Patient Active Problem List   Diagnosis Date Noted   Unilateral primary osteoarthritis, left hip 02/24/2020   Status post total hip replacement, right 01/19/2020   Decreased visual acuity 03/30/2017   Estrogen deficiency 03/30/2017   Inflammatory arthritis 11/13/2016   Essential hypertension 09/19/2016   History of anxiety and depression 09/19/2016   History of Meniere's disease 09/19/2016   History of hearing loss 09/19/2016   History of ADHD 09/19/2016   Dyspareunia, female 01/11/2016   Routine general medical examination at a health care facility 01/11/2016   Smoker 09/01/2015   Anxiety and depression 09/01/2015   Positive QuantiFERON-TB Gold test 09/01/2015   History of herpes simplex infection 09/01/2015    Past Medical History:  Diagnosis Date   Allergy    Anxiety    Arthritis    Depression    Hypertension    Meniere's disease of left ear    Psoriatic arthritis ([place])    Dr. Estanislado Pandy   Tuberculosis    Latent TB, started treatment on 08/30/2015    Family History  Problem Relation Age of Onset  Hypertension Mother    Cancer Mother        breast   Arthritis Mother    Depression Mother    Hypertension Father    Hypertension Brother    Stroke Brother    Colon cancer Neg Hx    Esophageal cancer Neg Hx    Rectal cancer Neg Hx    Stomach cancer Neg Hx    Past Surgical History:  Procedure Laterality Date   CESAREAN SECTION     COLONOSCOPY     MOUTH SURGERY  2020   tooth extraction/  bone graft    TOTAL HIP ARTHROPLASTY Right 01/19/2020   Procedure: RIGHT TOTAL HIP ARTHROPLASTY-DIRECT ANTERIOR;  Surgeon: Marybelle Killings, MD;  Location: [facility] ;  Service: Orthopedics;  Laterality: Right;   Social History   Social History Narrative   Not on file   Immunization History  Administered Date(s) Administered   Influenza,inj,Quad PF,6+ Mos 01/11/2016, 03/30/2017   Influenza-Unspecified 01/23/2018   PFIZER(Purple Top)SARS-COV-2 Vaccination 05/09/2019, 10/30/2019, 06/22/2020   Tdap 08/21/2014     Objective: Vital Signs: BP (!) 144/93 (BP Location: Left Arm, Patient Position: Sitting, Cuff Size: Normal)   Pulse (!) 102   Resp 14   Ht 5\' 6"  (1.676 m)   Wt 162 lb 6.4 oz (73.7 kg)   BMI 26.21 kg/m    Physical Exam Vitals and nursing note reviewed.  Constitutional:      Appearance: She is well-developed.  HENT:     Head: Normocephalic and atraumatic.  Eyes:     Conjunctiva/sclera: Conjunctivae normal.  Pulmonary:     Effort: Pulmonary effort is normal.  Abdominal:     Palpations: Abdomen is soft.  Musculoskeletal:     Cervical back: Normal range of motion.  Skin:    General: Skin is warm and dry.     Capillary Refill: Capillary refill takes less than 2 seconds.  Neurological:     Mental Status: She is alert and oriented to person, place, and time.  Psychiatric:        Behavior: Behavior normal.     Musculoskeletal Exam: C-spine good ROM.  Tenderness over left SI joint.  Shoulder joints, elbow joints, wrist joints, MCPs, PIPs, and DIPs good ROM with no synovitis.  Tenderness over both CMC joints, left greater than right.  PIP and DIP thickening consistent with osteoarthritis of both hands.  Right hip replacement good ROM.  Painful and limited ROM of the left hip.  Knee joints good ROM with no warmth or effusion.  Ankle joints good ROM with no tenderness or joint swelling.   CDAI Exam: CDAI Score: -- Patient Global: --; Provider Global: -- Swollen: --; Tender:  -- Joint Exam 08/18/2020   No joint exam has been documented for this visit   There is currently no information documented on the homunculus. Go to the Rheumatology activity and complete the homunculus joint exam.  Investigation: No additional findings.  Imaging: No results found.  Recent Labs: Lab Results  Component Value Date   WBC 10.9 (H) 03/31/2020   HGB 14.5 03/31/2020   PLT 394 03/31/2020   NA 140 03/31/2020   K 4.8 03/31/2020   CL 104 03/31/2020   CO2 29 03/31/2020   GLUCOSE 97 03/31/2020   BUN 17 03/31/2020   CREATININE 0.63 03/31/2020   BILITOT 0.3 03/31/2020   ALKPHOS 68 12/15/2019   AST 12 03/31/2020   ALT 13 03/31/2020   PROT 7.0 03/31/2020   ALBUMIN 4.3 12/15/2019   CALCIUM  9.6 03/31/2020   GFRAA 116 03/31/2020    Speciality Comments: No specialty comments available.  Procedures:  No procedures performed Allergies: Elemental sulfur and Otezla [apremilast]   Assessment / Plan:     Visit Diagnoses: Psoriatic arthritis ([place]) - Erosive psoriatic arthritis with radiographic progression between 09/2016-09/2019: She has no synovitis or dactylitis on examination today. She has tenderness over the left SI joint.  No evidence of achilles tendonitis or plantar fasciitis.  She has notice a few small patches of psoriasis in her ears and on her upper back. She continues to experience increased pain and stiffness up to 2 weeks prior to her scheduled Cosentyx injection.  She has been injecting Cosentyx 300 mg subcutaneously every month.  We discussed spacing the dose of Cosentyx to 150 mg every 14 days at her last office visit but she has not tried the dose change yet.  A sample of Cosentyx was provided today in order for her to try spacing her injections every 14 days.  She was advised to notify us if she continues to have increased pain and stiffness.  We will check a sed rate today.  We discussed trying an anti-inflammatory diet for at least trying to eat a more plant-based  diet.  She will follow-up in the office in 3 months to assess her response. - Plan: Sedimentation rate  High risk medication use - Cosentyx 150 mg sq injections every 14 days.  She will try spacing the dose of cosentyx to every 14 days.  CBC and CMP were drawn on 03/31/2020.  She is due to update lab work today.  Orders for CBC and CMP were released.  She is due to update yearly chest x-ray.  Future order was placed today.- Plan: DG Chest 2 View, COMPLETE METABOLIC PANEL WITH GFR, CBC with Differential/Platelet She has not had any recent infections.  She was advised to hold Cosentyx if she develops signs or symptoms of an infection and to resume once infection has completely cleared.  Primary osteoarthritis of both knees: She has good range of motion of both knee joints on examination today.  No warmth or effusion was noted.  Primary osteoarthritis of both feet - Pes planus.  She has seen a podiatrist in the past but she plans on establishing care at Dr. Jen[facility].    Primary osteoarthritis of left hip - X-rays on 09/10/19 revealed moderate to severe left hip osteoarthritis.  Chronic pain.  She would like to proceed with a left hip replacement fall 2022 performed by Dr. Lorin Mercy.  S/P total right hip arthroplasty - Performed by Dr. Lorin Mercy on 01/19/20.  Doing well.  She has good range of motion with no discomfort at this time.  DDD (degenerative disc disease), lumbar - MRI of the lumbar spine on 09/01/19 revealed left greater than right foraminal stenosis at L5-S1 secondary to chronic bilateral L5 pars defects with grade 1 anteriorolisthesis.  She has ongoing discomfort in her lower back.  Some tenderness over the left SI joint was noted.  No symptoms of radiculopathy at this time.  ANA positive: She has no clinical features of systemic lupus at this time.  Positive QuantiFERON-TB Gold test - CXR on 07/31/19 did not reveal any active cardiopulmonary disease.  No evidence of active TB infection.  Future order for  chest x-ray was placed today.  She plans on going to [facility] to have this performed. She is not experiencing any shortness of breath, coughing, or night sweats.- Plan: DG  Chest 2 View  Other fatigue - She has ongoing fatigue.  She requested to have vitamin B12 and vitamin D level checked today.  Plan: Vitamin B12, VITAMIN D 25 Hydroxy (Vit-D Deficiency, Fractures)  Vitamin D deficiency - She requested a vitamin D level checked today.  She has been experiencing increased fatigue on a daily basis.  Plan: VITAMIN D 25 Hydroxy (Vit-D Deficiency, Fractures)  Other medical conditions are listed as follows:   History of Meniere's disease  History of ADHD  History of herpes simplex infection  Essential hypertension  History of anxiety and depression   Orders: Orders Placed This Encounter  Procedures   DG Chest 2 View   Vitamin B12   COMPLETE METABOLIC PANEL WITH GFR   CBC with Differential/Platelet   VITAMIN D 25 Hydroxy (Vit-D Deficiency, Fractures)   Sedimentation rate   No orders of the defined types were placed in this encounter.     Follow-Up Instructions: Return in about 3 months (around 11/18/2020) for Psoriatic arthritis, Osteoarthritis.   Ofilia Neas, PA-C  Note - This record has been created using Dragon software.  Chart creation errors have been sought, but may not always  have been located. Such creation errors do not reflect on  the standard of medical care.

## 2020-08-18 ENCOUNTER — Encounter: Payer: Self-pay | Admitting: Physician Assistant

## 2020-08-18 ENCOUNTER — Other Ambulatory Visit: Payer: Self-pay

## 2020-08-18 ENCOUNTER — Ambulatory Visit (INDEPENDENT_AMBULATORY_CARE_PROVIDER_SITE_OTHER): Payer: 59 | Admitting: Physician Assistant

## 2020-08-18 VITALS — BP 144/93 | HR 102 | Resp 14 | Ht 66.0 in | Wt 162.4 lb

## 2020-08-18 DIAGNOSIS — R768 Other specified abnormal immunological findings in serum: Secondary | ICD-10-CM

## 2020-08-18 DIAGNOSIS — R7612 Nonspecific reaction to cell mediated immunity measurement of gamma interferon antigen response without active tuberculosis: Secondary | ICD-10-CM

## 2020-08-18 DIAGNOSIS — M170 Bilateral primary osteoarthritis of knee: Secondary | ICD-10-CM

## 2020-08-18 DIAGNOSIS — Z8669 Personal history of other diseases of the nervous system and sense organs: Secondary | ICD-10-CM

## 2020-08-18 DIAGNOSIS — M19071 Primary osteoarthritis, right ankle and foot: Secondary | ICD-10-CM

## 2020-08-18 DIAGNOSIS — I10 Essential (primary) hypertension: Secondary | ICD-10-CM

## 2020-08-18 DIAGNOSIS — Z8619 Personal history of other infectious and parasitic diseases: Secondary | ICD-10-CM

## 2020-08-18 DIAGNOSIS — M19072 Primary osteoarthritis, left ankle and foot: Secondary | ICD-10-CM

## 2020-08-18 DIAGNOSIS — E559 Vitamin D deficiency, unspecified: Secondary | ICD-10-CM

## 2020-08-18 DIAGNOSIS — Z79899 Other long term (current) drug therapy: Secondary | ICD-10-CM | POA: Diagnosis not present

## 2020-08-18 DIAGNOSIS — M5136 Other intervertebral disc degeneration, lumbar region: Secondary | ICD-10-CM

## 2020-08-18 DIAGNOSIS — L4050 Arthropathic psoriasis, unspecified: Secondary | ICD-10-CM

## 2020-08-18 DIAGNOSIS — Z96641 Presence of right artificial hip joint: Secondary | ICD-10-CM

## 2020-08-18 DIAGNOSIS — M1612 Unilateral primary osteoarthritis, left hip: Secondary | ICD-10-CM

## 2020-08-18 DIAGNOSIS — Z8659 Personal history of other mental and behavioral disorders: Secondary | ICD-10-CM

## 2020-08-18 DIAGNOSIS — R5383 Other fatigue: Secondary | ICD-10-CM

## 2020-08-18 LAB — CBC WITH DIFFERENTIAL/PLATELET
Absolute Monocytes: 718 cells/uL (ref 200–950)
Basophils Absolute: 62 cells/uL (ref 0–200)
Basophils Relative: 0.8 %
Eosinophils Absolute: 148 cells/uL (ref 15–500)
Eosinophils Relative: 1.9 %
HCT: 44.4 % (ref 35.0–45.0)
Hemoglobin: 14.1 g/dL (ref 11.7–15.5)
Lymphs Abs: 2808 cells/uL (ref 850–3900)
MCH: 29.7 pg (ref 27.0–33.0)
MCHC: 31.8 g/dL — ABNORMAL LOW (ref 32.0–36.0)
MCV: 93.7 fL (ref 80.0–100.0)
MPV: 11 fL (ref 7.5–12.5)
Monocytes Relative: 9.2 %
Neutro Abs: 4064 cells/uL (ref 1500–7800)
Neutrophils Relative %: 52.1 %
Platelets: 344 10*3/uL (ref 140–400)
RBC: 4.74 10*6/uL (ref 3.80–5.10)
RDW: 11.8 % (ref 11.0–15.0)
Total Lymphocyte: 36 %
WBC: 7.8 10*3/uL (ref 3.8–10.8)

## 2020-08-18 LAB — COMPLETE METABOLIC PANEL WITH GFR
AG Ratio: 1.4 (calc) (ref 1.0–2.5)
ALT: 14 U/L (ref 6–29)
AST: 14 U/L (ref 10–35)
Albumin: 4 g/dL (ref 3.6–5.1)
Alkaline phosphatase (APISO): 83 U/L (ref 37–153)
BUN: 12 mg/dL (ref 7–25)
CO2: 32 mmol/L (ref 20–32)
Calcium: 9.7 mg/dL (ref 8.6–10.4)
Chloride: 103 mmol/L (ref 98–110)
Creat: 0.71 mg/dL (ref 0.50–1.05)
GFR, Est African American: 110 mL/min/{1.73_m2} (ref >=60)
GFR, Est Non African American: 95 mL/min/{1.73_m2} (ref >=60)
Globulin: 2.9 g/dL (calc) (ref 1.9–3.7)
Glucose, Bld: 87 mg/dL (ref 65–99)
Potassium: 5.3 mmol/L (ref 3.5–5.3)
Sodium: 140 mmol/L (ref 135–146)
Total Bilirubin: 0.2 mg/dL (ref 0.2–1.2)
Total Protein: 6.9 g/dL (ref 6.1–8.1)

## 2020-08-18 LAB — SEDIMENTATION RATE: Sed Rate: 2 mm/h (ref 0–30)

## 2020-08-18 LAB — VITAMIN D 25 HYDROXY (VIT D DEFICIENCY, FRACTURES): Vit D, 25-Hydroxy: 14 ng/mL — ABNORMAL LOW (ref 30–100)

## 2020-08-18 LAB — VITAMIN B12: Vitamin B-12: 330 pg/mL (ref 200–1100)

## 2020-08-18 NOTE — Progress Notes (Signed)
Medication Samples have been provided to the patient.  Drug name: Cosentyx 150mg /mL pen Qty: 1 pen LOT: [id number] Exp.Date: 09/2021  [name] 8:50 AM 08/18/2020

## 2020-08-19 ENCOUNTER — Other Ambulatory Visit: Payer: Self-pay | Admitting: *Deleted

## 2020-08-19 DIAGNOSIS — E559 Vitamin D deficiency, unspecified: Secondary | ICD-10-CM

## 2020-08-19 MED ORDER — VITAMIN D (ERGOCALCIFEROL) 1.25 MG (50000 UNIT) PO CAPS: 50000.0000 [IU] | ORAL_CAPSULE | Freq: Two times a day (BID) | ORAL | 0 refills | Status: AC

## 2020-08-19 NOTE — Progress Notes (Signed)
CBC and CMP WNL.  Vitamin B12 is WNL.  ESR WNL.  Vitamin D is very low-14. Please notify the patient and send in vitamin D 50,000 units twice weekly x3 months. Recheck in 3 months.

## 2020-08-19 NOTE — Telephone Encounter (Signed)
-----  Message from Ofilia Neas, PA-C sent at 08/19/2020  8:01 AM EDT ----- CBC and CMP WNL.  Vitamin B12 is WNL.  ESR WNL.  Vitamin D is very low-14. Please notify the patient and send in vitamin D 50,000 units twice weekly x3 months. Recheck in 3 months.

## 2020-10-01 ENCOUNTER — Other Ambulatory Visit (HOSPITAL_COMMUNITY)
Admission: RE | Admit: 2020-10-01 | Discharge: 2020-10-01 | Disposition: A | Discharge status: Home / Self Care | Payer: 59 | Source: Ambulatory Visit | Attending: Family Medicine | Admitting: Family Medicine

## 2020-10-01 DIAGNOSIS — Z01411 Encounter for gynecological examination (general) (routine) with abnormal findings: Secondary | ICD-10-CM | POA: Insufficient documentation

## 2020-10-04 LAB — CYTOLOGY - PAP
Comment: NEGATIVE
Diagnosis: NEGATIVE
High risk HPV: NEGATIVE

## 2020-10-10 ENCOUNTER — Other Ambulatory Visit: Payer: Self-pay | Admitting: Rheumatology

## 2020-10-10 DIAGNOSIS — L4050 Arthropathic psoriasis, unspecified: Secondary | ICD-10-CM

## 2020-10-11 NOTE — Telephone Encounter (Signed)
Next Visit: due September 2022.   Last Visit: 08/18/2020  Last Fill: 06/18/2020  [id number] arthritis  Current Dose per office note 08/18/2020: Cosentyx 150 mg sq injections every 14 days  Labs: 08/18/2020 CBC and CMP WNL.    CXR on 07/31/19 did not reveal any active cardiopulmonary disease.  No evidence of active TB infection.    Left message to advise patient she is due to update chest x-ray and to schedule a follow up visit.   Okay to refill Cosentyx?

## 2020-11-08 ENCOUNTER — Other Ambulatory Visit: Payer: Self-pay | Admitting: Family Medicine

## 2020-11-08 DIAGNOSIS — Z1231 Encounter for screening mammogram for malignant neoplasm of breast: Secondary | ICD-10-CM

## 2020-11-10 ENCOUNTER — Other Ambulatory Visit: Payer: Self-pay

## 2020-11-10 ENCOUNTER — Ambulatory Visit
Admission: RE | Admit: 2020-11-10 | Discharge: 2020-11-10 | Disposition: A | Discharge status: Home / Self Care | Payer: 59 | Source: Ambulatory Visit | Attending: Family Medicine | Admitting: Family Medicine

## 2020-11-10 DIAGNOSIS — Z1231 Encounter for screening mammogram for malignant neoplasm of breast: Secondary | ICD-10-CM

## 2020-11-16 ENCOUNTER — Encounter: Payer: Self-pay | Admitting: Orthopaedic Surgery

## 2020-11-16 ENCOUNTER — Other Ambulatory Visit: Payer: Self-pay

## 2020-11-16 ENCOUNTER — Ambulatory Visit: Payer: 59 | Admitting: Orthopaedic Surgery

## 2020-11-16 ENCOUNTER — Ambulatory Visit: Payer: Self-pay

## 2020-11-16 VITALS — BP 136/83 | Ht 66.0 in | Wt 157.0 lb

## 2020-11-16 DIAGNOSIS — M25552 Pain in left hip: Secondary | ICD-10-CM | POA: Diagnosis not present

## 2020-11-16 DIAGNOSIS — M1612 Unilateral primary osteoarthritis, left hip: Secondary | ICD-10-CM

## 2020-11-16 DIAGNOSIS — Z96641 Presence of right artificial hip joint: Secondary | ICD-10-CM

## 2020-11-16 NOTE — Progress Notes (Signed)
Office Visit Note   Patient: Valerie Delgado           Date of Birth: 1965-11-02           MRN: [id number] Visit Date: 11/16/2020              Requested by: Glenis Smoker, MD [place],  [place] [id number] PCP: Glenis Smoker, MD   Assessment & Plan: Visit Diagnoses:  1. Pain in left hip   2. Unilateral primary osteoarthritis, left hip   3. Status post total hip replacement, right     Plan: Patient states she is ready proceed with left total of arthroplasty direct anterior approach.  Anesthesia risks of surgery was again discussed questions were elicited and answered.  She had questions about anesthesia being out of her network with her previous procedure and she can call and check on this.  I discussed with her the this is likely a statewide network.  Follow-Up Instructions: No follow-ups on file.   Orders:  Orders Placed This Encounter  Procedures   XR HIP UNILAT W OR W/O PELVIS 2-3 VIEWS LEFT   No orders of the defined types were placed in this encounter.     Procedures: No procedures performed   Clinical Data: No additional findings.   Subjective: Chief Complaint  Patient presents with   Left Hip - Pain    HPI patient turns post total of arthroplasty on the right hip.  States right hip is doing well but she has had progressive left groin pain limping not responsive anti-inflammatories ibuprofen and great difficulty sleeping.  She is fallen a couple times her hip grabs and catches.  X-rays show severe hip osteoarthritis of the left hip.  Patient's had previous total hip arthroplasty on the right done 01/19/2020.  History of Mnire's disease, ADHD, hypertension.  Urination positive for anxiety and depression.  Review of Systems previous C-section without anesthetic problems 1988.  History of psoriatic arthritis sleep apnea.  All other systems noncontributory to HPI.   Objective: Vital Signs: BP 136/83   Ht 5\' 6"  (1.676 m)   Wt  157 lb (71.2 kg)   BMI 25.34 kg/m   Physical Exam Constitutional:      Appearance: She is well-developed.  HENT:     Head: Normocephalic.     Right Ear: External ear normal.     Left Ear: External ear normal. There is no impacted cerumen.  Eyes:     Pupils: Pupils are equal, round, and reactive to light.  Neck:     Thyroid: No thyromegaly.     Trachea: No tracheal deviation.  Cardiovascular:     Rate and Rhythm: Normal rate.  Pulmonary:     Effort: Pulmonary effort is normal.  Abdominal:     Palpations: Abdomen is soft.  Musculoskeletal:     Cervical back: No rigidity.  Skin:    General: Skin is warm and dry.  Neurological:     Mental Status: She is alert and oriented to person, place, and time.  Psychiatric:        Behavior: Behavior normal.    Ortho Exam well-healed right total of arthroplasty incision.  She has 5 degrees internal rotation left hip with severe pain external rotation 20 degrees with pain.  Hip flexion contracture.  Distal pulses are intact static function is intact.  Positive Trendelenburg gait.  Specialty Comments:  No specialty comments available.  Imaging: 2 view x-rays AP pelvis show hips demonstrate  right total of arthroplasty with severe left hip osteoarthritis.  Deformity of the hand flattening of joint space marginal osteophyte subchondral sclerosis and subchondral cyst formation.  Impression: Satisfactory right total of arthroplasty.  Severe left hip osteoarthritis as described above.   PMFS History: Patient Active Problem List   Diagnosis Date Noted   Unilateral primary osteoarthritis, left hip 02/24/2020   Status post total hip replacement, right 01/19/2020   Decreased visual acuity 03/30/2017   Estrogen deficiency 03/30/2017   Inflammatory arthritis 11/13/2016   Essential hypertension 09/19/2016   History of anxiety and depression 09/19/2016   History of Meniere's disease 09/19/2016   History of hearing loss 09/19/2016   History of  ADHD 09/19/2016   Dyspareunia, female 01/11/2016   Routine general medical examination at a health care facility 01/11/2016   Smoker 09/01/2015   Anxiety and depression 09/01/2015   Positive QuantiFERON-TB Gold test 09/01/2015   History of herpes simplex infection 09/01/2015   Past Medical History:  Diagnosis Date   Allergy    Anxiety    Arthritis    Depression    Hypertension    Meniere's disease of left ear    Psoriatic arthritis ([place])    Dr. Estanislado Pandy   Tuberculosis    Latent TB, started treatment on 08/30/2015    Family History  Problem Relation Age of Onset   Hypertension Mother    Cancer Mother        breast   Arthritis Mother    Depression Mother    Hypertension Father    Hypertension Brother    Stroke Brother    Colon cancer Neg Hx    Esophageal cancer Neg Hx    Rectal cancer Neg Hx    Stomach cancer Neg Hx     Past Surgical History:  Procedure Laterality Date   CESAREAN SECTION     COLONOSCOPY     MOUTH SURGERY  2020   tooth extraction/ bone graft    TOTAL HIP ARTHROPLASTY Right 01/19/2020   Procedure: RIGHT TOTAL HIP ARTHROPLASTY-DIRECT ANTERIOR;  Surgeon: Marybelle Killings, MD;  Location: [facility];  Service: Orthopedics;  Laterality: Right;   Social History   Occupational History   Not on file  Tobacco Use   Smoking status: Former    Years: 30.00    Types: Cigarettes    Quit date: 08/2019    Years since quitting: 1.2   Smokeless tobacco: Never  Vaping Use   Vaping Use: Never used  Substance and Sexual Activity   Alcohol use: Yes    Comment: occ   Drug use: No   Sexual activity: Not Currently    Comment: lives with her mother. 1 child age 85.

## 2020-11-23 ENCOUNTER — Encounter: Payer: Self-pay | Admitting: Orthopaedic Surgery

## 2020-12-04 ENCOUNTER — Other Ambulatory Visit: Payer: Self-pay | Admitting: Physician Assistant

## 2020-12-04 DIAGNOSIS — L4050 Arthropathic psoriasis, unspecified: Secondary | ICD-10-CM

## 2020-12-06 ENCOUNTER — Other Ambulatory Visit: Payer: Self-pay

## 2020-12-09 ENCOUNTER — Ambulatory Visit: Payer: 59 | Admitting: Surgery

## 2020-12-16 NOTE — Pre-Procedure Instructions (Signed)
Surgical Instructions   Your procedure is scheduled on Friday, November 4th. Report to [facility] Main Entrance "A" at 05:30 A.M., then check in with the Admitting office. Call this number if you have problems the morning of surgery: [phone]   If you have any questions prior to your surgery date call [phone]: Open Monday-Friday 8am-4pm   Remember: Do not eat after midnight the night before your surgery  You may drink clear liquids until 04:30 AM the morning of your surgery.   Clear liquids allowed are: Water, Non-Citrus Juices (without pulp), Carbonated Beverages, Clear Tea, Black Coffee Only, and Gatorade    Patient Instructions  The night before surgery:  No food after midnight. ONLY clear liquids after midnight  The day of surgery (if you do NOT have diabetes):  Drink ONE (1) Pre-Surgery Clear Ensure by 04:30 AM the morning of surgery. Drink in one sitting. Do not sip.  This drink was given to you during your hospital  pre-op appointment visit.  Nothing else to drink after completing the  Pre-Surgery Clear Ensure.          If you have questions, please contact your surgeon's office.      Take these medicines the morning of surgery with A SIP OF WATER  amLODipine (NORVASC) DULoxetine (CYMBALTA)  valACYclovir (VALTREX)   If needed: ALPRAZolam Duanne Moron)  As of today, STOP taking any Aspirin (unless otherwise instructed by your surgeon) Aleve, Naproxen, Ibuprofen, Motrin, Advil, Goody's, BC's, all herbal medications, fish oil, and all vitamins. This includes diclofenac Sodium (VOLTAREN) gel.                     Do NOT Smoke (Tobacco/Vaping) or drink Alcohol 24 hours prior to your procedure.  If you use a CPAP at night, you may bring all equipment for your overnight stay.   Contacts, glasses, piercing's, hearing aid's, dentures or partials may not be worn into surgery, please bring cases for these belongings.    For patients admitted to the hospital,  discharge time will be determined by your treatment team.   Patients discharged the day of surgery will not be allowed to drive home, and someone needs to stay with them for 24 hours.  NO VISITORS WILL BE ALLOWED IN PRE-OP WHERE PATIENTS GET READY FOR SURGERY.  ONLY 1 SUPPORT PERSON MAY BE PRESENT IN THE WAITING ROOM WHILE YOU ARE IN SURGERY.  IF YOU ARE TO BE ADMITTED, ONCE YOU ARE IN YOUR ROOM YOU WILL BE ALLOWED TWO (2) VISITORS.  Minor children may have two parents present. Special consideration for safety and communication needs will be reviewed on a case by case basis.   Special instructions:   [facility]- Preparing For Surgery  Before surgery, you can play an important role. Because skin is not sterile, your skin needs to be as free of germs as possible. You can reduce the number of germs on your skin by washing with CHG (chlorahexidine gluconate) Soap before surgery.  CHG is an antiseptic cleaner which kills germs and bonds with the skin to continue killing germs even after washing.    Oral Hygiene is also important to reduce your risk of infection.  Remember - BRUSH YOUR TEETH THE MORNING OF SURGERY WITH YOUR REGULAR TOOTHPASTE  Please do not use if you have an allergy to CHG or antibacterial soaps. If your skin becomes reddened/irritated stop using the CHG.  Do not shave (including legs and underarms) for at least 48 hours prior  to first CHG shower. It is OK to shave your face.  Please follow these instructions carefully.   Shower the NIGHT BEFORE SURGERY and the MORNING OF SURGERY  If you chose to wash your hair, wash your hair first as usual with your normal shampoo.  After you shampoo, rinse your hair and body thoroughly to remove the shampoo.  Use CHG Soap as you would any other liquid soap. You can apply CHG directly to the skin and wash gently with a scrungie or a clean washcloth.   Apply the CHG Soap to your body ONLY FROM THE NECK DOWN.  Do not use on open wounds or open  sores. Avoid contact with your eyes, ears, mouth and genitals (private parts). Wash Face and genitals (private parts)  with your normal soap.   Wash thoroughly, paying special attention to the area where your surgery will be performed.  Thoroughly rinse your body with warm water from the neck down.  DO NOT shower/wash with your normal soap after using and rinsing off the CHG Soap.  Pat yourself dry with a CLEAN TOWEL.  Wear CLEAN PAJAMAS to bed the night before surgery  Place CLEAN SHEETS on your bed the night before your surgery  DO NOT SLEEP WITH PETS.   Day of Surgery: Shower with CHG soap. Do not wear jewelry, make up, nail polish, gel polish, artificial nails, or any other type of covering on natural nails including finger and toenails. If patients have artificial nails, gel coating, etc. that need to be removed by a nail salon please have this removed prior to surgery. Surgery may need to be canceled/delayed if the surgeon/ anesthesia feels like the patient is unable to be adequately monitored. Do not wear lotions, powders, perfumes, or deodorant. Do not shave 48 hours prior to surgery.   Do not bring valuables to the hospital. [facility] is not responsible for any belongings or valuables. Wear Clean/Comfortable clothing the morning of surgery Remember to brush your teeth WITH YOUR REGULAR TOOTHPASTE.   Please read over the following fact sheets that you were given.   3 days prior to your procedure or After your COVID test   You are not required to quarantine however you are required to wear a well-fitting mask when you are out and around people not in your household. If your mask becomes wet or soiled, replace with a new one.   Wash your hands often with soap and water for 20 seconds or clean your hands with an alcohol-based hand sanitizer that contains at least 60% alcohol.   Do not share personal items.   Notify your provider:  o if you are in close contact with  someone who has COVID  o or if you develop a fever of 100.4 or greater, sneezing, cough, sore throat, shortness of breath or body aches.

## 2020-12-17 ENCOUNTER — Other Ambulatory Visit: Payer: Self-pay

## 2020-12-17 ENCOUNTER — Encounter (HOSPITAL_COMMUNITY): Payer: Self-pay

## 2020-12-17 ENCOUNTER — Encounter (HOSPITAL_COMMUNITY)
Admission: RE | Admit: 2020-12-17 | Discharge: 2020-12-17 | Disposition: A | Discharge status: Home / Self Care | Payer: 59 | Source: Ambulatory Visit | Attending: Orthopaedic Surgery | Admitting: Orthopaedic Surgery

## 2020-12-17 DIAGNOSIS — Z01818 Encounter for other preprocedural examination: Secondary | ICD-10-CM

## 2020-12-17 LAB — COMPREHENSIVE METABOLIC PANEL
ALT: 18 U/L (ref 0–44)
AST: 17 U/L (ref 15–41)
Albumin: 3.5 g/dL (ref 3.5–5.0)
Alkaline Phosphatase: 74 U/L (ref 38–126)
Anion gap: 6 (ref 5–15)
BUN: 17 mg/dL (ref 6–20)
CO2: 27 mmol/L (ref 22–32)
Calcium: 8.8 mg/dL — ABNORMAL LOW (ref 8.9–10.3)
Chloride: 104 mmol/L (ref 98–111)
Creatinine, Ser: 0.63 mg/dL (ref 0.44–1.00)
GFR, Estimated: >60 mL/min (ref >60)
Glucose, Bld: 93 mg/dL (ref 70–99)
Potassium: 4.2 mmol/L (ref 3.5–5.1)
Sodium: 137 mmol/L (ref 135–145)
Total Bilirubin: 0.2 mg/dL — ABNORMAL LOW (ref 0.3–1.2)
Total Protein: 6.5 g/dL (ref 6.5–8.1)

## 2020-12-17 LAB — CBC
HCT: 41.1 % (ref 36.0–46.0)
Hemoglobin: 13.1 g/dL (ref 12.0–15.0)
MCH: 30.3 pg (ref 26.0–34.0)
MCHC: 31.9 g/dL (ref 30.0–36.0)
MCV: 95.1 fL (ref 80.0–100.0)
Platelets: 280 10*3/uL (ref 150–400)
RBC: 4.32 MIL/uL (ref 3.87–5.11)
RDW: 13.4 % (ref 11.5–15.5)
WBC: 6.6 10*3/uL (ref 4.0–10.5)
nRBC: 0 % (ref 0.0–0.2)

## 2020-12-17 LAB — SURGICAL PCR SCREEN
MRSA, PCR: NEGATIVE
Staphylococcus aureus: NEGATIVE

## 2020-12-17 NOTE — Progress Notes (Signed)
PCP - Dr. Sela Hilding Cardiologist - denies  PPM/ICD - denies  Chest x-ray - 07/31/19 EKG - 12/17/20 at PAT appt Stress Test - denies ECHO - denies Cardiac Cath - denies  Sleep Study - denies   DM- denies  Blood Thinner Instructions: n/a Aspirin Instructions: n/a  ERAS Protcol - yes PRE-SURGERY Ensure given  COVID TEST- pt scheduled for testing on 12/22/20   Anesthesia review: no  Patient denies shortness of breath, fever, cough and chest pain at PAT appointment   All instructions explained to the patient, with a verbal understanding of the material. Patient agrees to go over the instructions while at home for a better understanding. Patient also instructed to wear a mask in public for 3 days prior to surgery. The opportunity to ask questions was provided.

## 2020-12-22 ENCOUNTER — Other Ambulatory Visit: Payer: Self-pay

## 2020-12-22 ENCOUNTER — Other Ambulatory Visit (HOSPITAL_COMMUNITY)
Admission: RE | Admit: 2020-12-22 | Discharge: 2020-12-22 | Disposition: A | Discharge status: Home / Self Care | Payer: 59 | Source: Ambulatory Visit | Attending: Orthopaedic Surgery | Admitting: Orthopaedic Surgery

## 2020-12-22 ENCOUNTER — Encounter: Payer: Self-pay | Admitting: Surgery

## 2020-12-22 ENCOUNTER — Ambulatory Visit (INDEPENDENT_AMBULATORY_CARE_PROVIDER_SITE_OTHER): Payer: 59 | Admitting: Surgery

## 2020-12-22 VITALS — BP 138/93 | HR 92 | Ht 66.0 in | Wt 170.2 lb

## 2020-12-22 DIAGNOSIS — Z01812 Encounter for preprocedural laboratory examination: Secondary | ICD-10-CM | POA: Diagnosis present

## 2020-12-22 DIAGNOSIS — Z20822 Contact with and (suspected) exposure to covid-19: Secondary | ICD-10-CM | POA: Diagnosis not present

## 2020-12-22 DIAGNOSIS — Z01818 Encounter for other preprocedural examination: Secondary | ICD-10-CM

## 2020-12-22 DIAGNOSIS — M1612 Unilateral primary osteoarthritis, left hip: Secondary | ICD-10-CM

## 2020-12-22 LAB — SARS CORONAVIRUS 2 (TAT 6-24 HRS): SARS Coronavirus 2: NEGATIVE

## 2020-12-22 NOTE — Progress Notes (Signed)
55 year old white female history of end-stage DJD left hip and pain comes in for preop evaluation.  Hip symptoms unchanged from previous visit.  She is want to proceed with left total hip replacement as scheduled.  Today history physical performed.  Review of systems negative.  Patient has had right total hip replacement done previously and this is doing very well.  All questions answered.

## 2020-12-24 ENCOUNTER — Ambulatory Visit (HOSPITAL_COMMUNITY): Payer: 59 | Admitting: Certified Registered"

## 2020-12-24 ENCOUNTER — Encounter (HOSPITAL_COMMUNITY): Payer: Self-pay | Admitting: Orthopaedic Surgery

## 2020-12-24 ENCOUNTER — Observation Stay (HOSPITAL_COMMUNITY)
Admission: RE | Admit: 2020-12-24 | Discharge: 2020-12-25 | Disposition: A | Discharge status: Home / Self Care | Payer: 59 | Source: Ambulatory Visit | Attending: Orthopaedic Surgery | Admitting: Orthopaedic Surgery

## 2020-12-24 ENCOUNTER — Encounter (HOSPITAL_COMMUNITY)
Admission: RE | Disposition: A | Discharge status: Home / Self Care | Payer: Self-pay | Source: Ambulatory Visit | Attending: Orthopaedic Surgery

## 2020-12-24 ENCOUNTER — Observation Stay (HOSPITAL_COMMUNITY): Payer: 59

## 2020-12-24 ENCOUNTER — Ambulatory Visit (HOSPITAL_COMMUNITY): Payer: 59

## 2020-12-24 DIAGNOSIS — I10 Essential (primary) hypertension: Secondary | ICD-10-CM | POA: Insufficient documentation

## 2020-12-24 DIAGNOSIS — Z79899 Other long term (current) drug therapy: Secondary | ICD-10-CM | POA: Diagnosis not present

## 2020-12-24 DIAGNOSIS — M169 Osteoarthritis of hip, unspecified: Secondary | ICD-10-CM | POA: Diagnosis present

## 2020-12-24 DIAGNOSIS — Z87891 Personal history of nicotine dependence: Secondary | ICD-10-CM | POA: Insufficient documentation

## 2020-12-24 DIAGNOSIS — Z01818 Encounter for other preprocedural examination: Secondary | ICD-10-CM

## 2020-12-24 DIAGNOSIS — M1612 Unilateral primary osteoarthritis, left hip: Secondary | ICD-10-CM | POA: Diagnosis not present

## 2020-12-24 DIAGNOSIS — Z419 Encounter for procedure for purposes other than remedying health state, unspecified: Secondary | ICD-10-CM

## 2020-12-24 DIAGNOSIS — Z09 Encounter for follow-up examination after completed treatment for conditions other than malignant neoplasm: Secondary | ICD-10-CM

## 2020-12-24 HISTORY — PX: TOTAL HIP ARTHROPLASTY: SHX124

## 2020-12-24 LAB — TYPE AND SCREEN
ABO/RH(D): A POS
Antibody Screen: NEGATIVE

## 2020-12-24 LAB — ABO/RH: ABO/RH(D): A POS

## 2020-12-24 SURGERY — ARTHROPLASTY, HIP, TOTAL, ANTERIOR APPROACH
Anesthesia: Spinal | Site: Hip | Laterality: Left

## 2020-12-24 MED ORDER — BUPIVACAINE-EPINEPHRINE (PF) 0.25% -1:200000 IJ SOLN
INTRAMUSCULAR | Status: AC
  Filled 2020-12-24: qty 30

## 2020-12-24 MED ORDER — METHOCARBAMOL 500 MG PO TABS
500.0000 mg | ORAL_TABLET | Freq: Four times a day (QID) | ORAL | Status: DC | PRN
  Administered 2020-12-24: 500 mg via ORAL
  Filled 2020-12-24: qty 1

## 2020-12-24 MED ORDER — AMPHETAMINE-DEXTROAMPHETAMINE 10 MG PO TABS
30.0000 mg | ORAL_TABLET | Freq: Every day | ORAL | Status: DC
  Administered 2020-12-25: 30 mg via ORAL
  Filled 2020-12-24 (×2): qty 3

## 2020-12-24 MED ORDER — DEXAMETHASONE SODIUM PHOSPHATE 10 MG/ML IJ SOLN
INTRAMUSCULAR | Status: DC | PRN
  Administered 2020-12-24: 10 mg via INTRAVENOUS

## 2020-12-24 MED ORDER — ASPIRIN EC 325 MG PO TBEC
325.0000 mg | DELAYED_RELEASE_TABLET | Freq: Every day | ORAL | Status: DC
  Administered 2020-12-25: 325 mg via ORAL
  Filled 2020-12-24 (×2): qty 1

## 2020-12-24 MED ORDER — POLYETHYLENE GLYCOL 3350 17 G PO PACK: 17.0000 g | PACK | Freq: Every day | ORAL | Status: DC | PRN

## 2020-12-24 MED ORDER — OXYCODONE-ACETAMINOPHEN 5-325 MG PO TABS: 1.0000 | ORAL_TABLET | Freq: Four times a day (QID) | ORAL | 0 refills | Status: AC | PRN

## 2020-12-24 MED ORDER — AMLODIPINE BESYLATE 10 MG PO TABS
10.0000 mg | ORAL_TABLET | Freq: Every day | ORAL | Status: DC
  Administered 2020-12-25: 10 mg via ORAL
  Filled 2020-12-24 (×2): qty 1

## 2020-12-24 MED ORDER — MIDAZOLAM HCL 2 MG/2ML IJ SOLN
INTRAMUSCULAR | Status: DC | PRN
  Administered 2020-12-24: 2 mg via INTRAVENOUS

## 2020-12-24 MED ORDER — OXYCODONE HCL 5 MG PO TABS: 5.0000 mg | ORAL_TABLET | Freq: Once | ORAL | Status: DC | PRN

## 2020-12-24 MED ORDER — BUPIVACAINE LIPOSOME 1.3 % IJ SUSP
INTRAMUSCULAR | Status: AC
  Filled 2020-12-24: qty 20

## 2020-12-24 MED ORDER — CHLORHEXIDINE GLUCONATE 0.12 % MT SOLN: 15.0000 mL | Freq: Once | OROMUCOSAL | Status: AC

## 2020-12-24 MED ORDER — LACTATED RINGERS IV SOLN: INTRAVENOUS | Status: DC | PRN

## 2020-12-24 MED ORDER — SODIUM CHLORIDE 0.9 % IV SOLN: INTRAVENOUS | Status: DC

## 2020-12-24 MED ORDER — CEFAZOLIN SODIUM-DEXTROSE 2-4 GM/100ML-% IV SOLN
2.0000 g | INTRAVENOUS | Status: AC
  Administered 2020-12-24: 2 g via INTRAVENOUS
  Filled 2020-12-24: qty 100

## 2020-12-24 MED ORDER — LACTATED RINGERS IV SOLN: INTRAVENOUS | Status: DC

## 2020-12-24 MED ORDER — OXYCODONE HCL 5 MG/5ML PO SOLN: 5.0000 mg | Freq: Once | ORAL | Status: DC | PRN

## 2020-12-24 MED ORDER — ACETAMINOPHEN 325 MG PO TABS: 325.0000 mg | ORAL_TABLET | Freq: Four times a day (QID) | ORAL | Status: DC | PRN

## 2020-12-24 MED ORDER — ACETAMINOPHEN 10 MG/ML IV SOLN
INTRAVENOUS | Status: DC | PRN
  Administered 2020-12-24: 1000 mg via INTRAVENOUS

## 2020-12-24 MED ORDER — TRANEXAMIC ACID-NACL 1000-0.7 MG/100ML-% IV SOLN
INTRAVENOUS | Status: AC
  Filled 2020-12-24: qty 100

## 2020-12-24 MED ORDER — ASPIRIN EC 325 MG PO TBEC: 325.0000 mg | DELAYED_RELEASE_TABLET | Freq: Every day | ORAL | 0 refills | Status: AC

## 2020-12-24 MED ORDER — PROPOFOL 500 MG/50ML IV EMUL
INTRAVENOUS | Status: DC | PRN
  Administered 2020-12-24: 100 ug/kg/min via INTRAVENOUS

## 2020-12-24 MED ORDER — DOCUSATE SODIUM 100 MG PO CAPS
100.0000 mg | ORAL_CAPSULE | Freq: Two times a day (BID) | ORAL | Status: DC
  Administered 2020-12-24 – 2020-12-25 (×3): 100 mg via ORAL
  Filled 2020-12-24 (×3): qty 1

## 2020-12-24 MED ORDER — OXYCODONE HCL 5 MG PO TABS
5.0000 mg | ORAL_TABLET | ORAL | Status: DC | PRN
  Administered 2020-12-24 (×2): 10 mg via ORAL
  Filled 2020-12-24 (×2): qty 2

## 2020-12-24 MED ORDER — ORAL CARE MOUTH RINSE: 15.0000 mL | Freq: Once | OROMUCOSAL | Status: AC

## 2020-12-24 MED ORDER — ONDANSETRON HCL 4 MG/2ML IJ SOLN
INTRAMUSCULAR | Status: DC | PRN
  Administered 2020-12-24: 4 mg via INTRAVENOUS

## 2020-12-24 MED ORDER — BUPIVACAINE LIPOSOME 1.3 % IJ SUSP
20.0000 mL | Freq: Once | INTRAMUSCULAR | Status: DC
Start: 2020-12-24 — End: 2020-12-24
  Filled 2020-12-24: qty 20

## 2020-12-24 MED ORDER — MENTHOL 3 MG MT LOZG: 1.0000 | LOZENGE | OROMUCOSAL | Status: DC | PRN

## 2020-12-24 MED ORDER — METHOCARBAMOL 1000 MG/10ML IJ SOLN
500.0000 mg | Freq: Four times a day (QID) | INTRAVENOUS | Status: DC | PRN
  Filled 2020-12-24: qty 5

## 2020-12-24 MED ORDER — METHOCARBAMOL 500 MG PO TABS: 500.0000 mg | ORAL_TABLET | Freq: Four times a day (QID) | ORAL | 0 refills | Status: AC | PRN

## 2020-12-24 MED ORDER — 0.9 % SODIUM CHLORIDE (POUR BTL) OPTIME
TOPICAL | Status: DC | PRN
  Administered 2020-12-24: 1000 mL

## 2020-12-24 MED ORDER — FENTANYL CITRATE (PF) 100 MCG/2ML IJ SOLN: 25.0000 ug | INTRAMUSCULAR | Status: DC | PRN

## 2020-12-24 MED ORDER — PHENOL 1.4 % MT LIQD: 1.0000 | OROMUCOSAL | Status: DC | PRN

## 2020-12-24 MED ORDER — MIDAZOLAM HCL 2 MG/2ML IJ SOLN
INTRAMUSCULAR | Status: AC
  Filled 2020-12-24: qty 2

## 2020-12-24 MED ORDER — BUPIVACAINE HCL 0.5 % IJ SOLN
INTRAMUSCULAR | Status: DC | PRN
  Administered 2020-12-24: 10 mL via INTRA_ARTICULAR

## 2020-12-24 MED ORDER — METOCLOPRAMIDE HCL 5 MG PO TABS: 5.0000 mg | ORAL_TABLET | Freq: Three times a day (TID) | ORAL | Status: DC | PRN

## 2020-12-24 MED ORDER — BUPIVACAINE LIPOSOME 1.3 % IJ SUSP
INTRAMUSCULAR | Status: DC | PRN
  Administered 2020-12-24: 10 mL

## 2020-12-24 MED ORDER — AMISULPRIDE (ANTIEMETIC) 5 MG/2ML IV SOLN: 10.0000 mg | Freq: Once | INTRAVENOUS | Status: DC | PRN

## 2020-12-24 MED ORDER — ONDANSETRON HCL 4 MG/2ML IJ SOLN: 4.0000 mg | Freq: Once | INTRAMUSCULAR | Status: DC | PRN

## 2020-12-24 MED ORDER — BUPIVACAINE IN DEXTROSE 0.75-8.25 % IT SOLN
INTRATHECAL | Status: DC | PRN
  Administered 2020-12-24: 2 mL via INTRATHECAL

## 2020-12-24 MED ORDER — FENTANYL CITRATE (PF) 250 MCG/5ML IJ SOLN
INTRAMUSCULAR | Status: AC
  Filled 2020-12-24: qty 5

## 2020-12-24 MED ORDER — CHLORHEXIDINE GLUCONATE 0.12 % MT SOLN
OROMUCOSAL | Status: AC
  Administered 2020-12-24: 15 mL via OROMUCOSAL
  Filled 2020-12-24: qty 15

## 2020-12-24 MED ORDER — TRANEXAMIC ACID-NACL 1000-0.7 MG/100ML-% IV SOLN
INTRAVENOUS | Status: DC | PRN
  Administered 2020-12-24: 1000 mg via INTRAVENOUS

## 2020-12-24 MED ORDER — ONDANSETRON HCL 4 MG PO TABS: 4.0000 mg | ORAL_TABLET | Freq: Four times a day (QID) | ORAL | Status: DC | PRN

## 2020-12-24 MED ORDER — ONDANSETRON HCL 4 MG/2ML IJ SOLN: 4.0000 mg | Freq: Four times a day (QID) | INTRAMUSCULAR | Status: DC | PRN

## 2020-12-24 MED ORDER — HYDROMORPHONE HCL 1 MG/ML IJ SOLN
0.5000 mg | INTRAMUSCULAR | Status: DC | PRN
  Administered 2020-12-25: 1 mg via INTRAVENOUS
  Filled 2020-12-24: qty 1

## 2020-12-24 MED ORDER — FENTANYL CITRATE (PF) 250 MCG/5ML IJ SOLN
INTRAMUSCULAR | Status: DC | PRN
  Administered 2020-12-24: 50 ug via INTRAVENOUS

## 2020-12-24 MED ORDER — METOCLOPRAMIDE HCL 5 MG/ML IJ SOLN: 5.0000 mg | Freq: Three times a day (TID) | INTRAMUSCULAR | Status: DC | PRN

## 2020-12-24 MED ORDER — PROPOFOL 10 MG/ML IV BOLUS
INTRAVENOUS | Status: AC
  Filled 2020-12-24: qty 20

## 2020-12-24 MED ORDER — DULOXETINE HCL 60 MG PO CPEP
120.0000 mg | ORAL_CAPSULE | Freq: Every day | ORAL | Status: DC
  Administered 2020-12-24 – 2020-12-25 (×2): 120 mg via ORAL
  Filled 2020-12-24 (×2): qty 2

## 2020-12-24 MED ORDER — LIDOCAINE HCL (CARDIAC) PF 100 MG/5ML IV SOSY
PREFILLED_SYRINGE | INTRAVENOUS | Status: DC | PRN
  Administered 2020-12-24: 100 mg via INTRATRACHEAL

## 2020-12-24 MED ORDER — BUPIVACAINE HCL (PF) 0.5 % IJ SOLN
INTRAMUSCULAR | Status: AC
  Filled 2020-12-24: qty 30

## 2020-12-24 MED ORDER — ALPRAZOLAM 0.5 MG PO TABS: 1.0000 mg | ORAL_TABLET | Freq: Two times a day (BID) | ORAL | Status: DC | PRN

## 2020-12-24 SURGICAL SUPPLY — 49 items
BAG COUNTER SPONGE SURGICOUNT (BAG) ×2 IMPLANT
BENZOIN TINCTURE PRP APPL 2/3 (GAUZE/BANDAGES/DRESSINGS) ×2 IMPLANT
BLADE CLIPPER SURG (BLADE) IMPLANT
BLADE SAW SGTL 18X1.27X75 (BLADE) ×2 IMPLANT
CELLS DAT CNTRL 66122 CELL SVR (MISCELLANEOUS) ×1 IMPLANT
COVER SURGICAL LIGHT HANDLE (MISCELLANEOUS) ×2 IMPLANT
DRAPE C-ARM 42X72 X-RAY (DRAPES) ×2 IMPLANT
DRAPE IMP U-DRAPE 54X76 (DRAPES) ×2 IMPLANT
DRAPE STERI IOBAN 125X83 (DRAPES) ×2 IMPLANT
DRAPE U-SHAPE 47X51 STRL (DRAPES) ×4 IMPLANT
DRSG MEPILEX BORDER 4X8 (GAUZE/BANDAGES/DRESSINGS) ×2 IMPLANT
DURAPREP 26ML APPLICATOR (WOUND CARE) ×2 IMPLANT
ELECT BLADE 4.0 EZ CLEAN MEGAD (MISCELLANEOUS)
ELECT CAUTERY BLADE 6.4 (BLADE) ×2 IMPLANT
ELECT REM PT RETURN 9FT ADLT (ELECTROSURGICAL) ×2
ELECTRODE BLDE 4.0 EZ CLN MEGD (MISCELLANEOUS) IMPLANT
ELECTRODE REM PT RTRN 9FT ADLT (ELECTROSURGICAL) ×1 IMPLANT
ELIMINATOR HOLE APEX DEPUY (Hips) ×2 IMPLANT
FACESHIELD WRAPAROUND (MASK) ×6 IMPLANT
GLOVE SRG 8 PF TXTR STRL LF DI (GLOVE) ×2 IMPLANT
GLOVE SURG ORTHO LTX SZ7.5 (GLOVE) ×4 IMPLANT
GLOVE SURG UNDER POLY LF SZ8 (GLOVE) ×4
GOWN STRL REUS W/ TWL LRG LVL3 (GOWN DISPOSABLE) ×1 IMPLANT
GOWN STRL REUS W/ TWL XL LVL3 (GOWN DISPOSABLE) ×1 IMPLANT
GOWN STRL REUS W/TWL 2XL LVL3 (GOWN DISPOSABLE) ×2 IMPLANT
GOWN STRL REUS W/TWL LRG LVL3 (GOWN DISPOSABLE) ×1
GOWN STRL REUS W/TWL XL LVL3 (GOWN DISPOSABLE) ×2
HEAD CERAMIC DELTA 36 PLUS 1.5 (Hips) ×2 IMPLANT
KIT BASIN OR (CUSTOM PROCEDURE TRAY) ×2 IMPLANT
KIT TURNOVER KIT B (KITS) ×2 IMPLANT
LINER NEUTRAL 52X36MM PLUS 4 (Liner) ×2 IMPLANT
MANIFOLD NEPTUNE II (INSTRUMENTS) ×2 IMPLANT
NS IRRIG 1000ML POUR BTL (IV SOLUTION) ×2 IMPLANT
PACK TOTAL JOINT (CUSTOM PROCEDURE TRAY) ×2 IMPLANT
PAD ARMBOARD 7.5X6 YLW CONV (MISCELLANEOUS) ×4 IMPLANT
PIN SECTOR W/GRIP ACE CUP 52MM (Hips) ×2 IMPLANT
RTRCTR WOUND ALEXIS 18CM MED (MISCELLANEOUS) ×2
STEM FEMORAL SZ6 HIGH ACTIS (Stem) ×2 IMPLANT
STRIP CLOSURE SKIN 1/2X4 (GAUZE/BANDAGES/DRESSINGS) ×2 IMPLANT
SUT VIC AB 0 CT1 27 (SUTURE) ×1
SUT VIC AB 0 CT1 27XBRD ANBCTR (SUTURE) ×1 IMPLANT
SUT VIC AB 2-0 CT1 27 (SUTURE) ×2
SUT VIC AB 2-0 CT1 TAPERPNT 27 (SUTURE) ×1 IMPLANT
SUT VICRYL 4-0 PS2 18IN ABS (SUTURE) ×2 IMPLANT
SUT VLOC 180 0 24IN GS25 (SUTURE) ×2 IMPLANT
TOWEL GREEN STERILE (TOWEL DISPOSABLE) ×4 IMPLANT
TOWEL GREEN STERILE FF (TOWEL DISPOSABLE) ×2 IMPLANT
TRAY CATH 16FR W/PLASTIC CATH (SET/KITS/TRAYS/PACK) ×2 IMPLANT
WATER STERILE IRR 1000ML POUR (IV SOLUTION) ×4 IMPLANT

## 2020-12-24 NOTE — Op Note (Signed)
Preop diagnosis: Left hip primary osteoarthritis  Postop diagnosis: Same  Procedure: Left total hip arthroplasty direct anterior approach.  Surgeon: Lorin Mercy MD  Assistant: Benjiman Core, PA medically necessary and present for exposure and implant placement and closure.  Anesthesia: Spinal plus Marcaine and Exparel.  Implants:Implants  PIN SECTOR W/GRIP ACE CUP 52MM - [id number]  Inventory Item: PIN SECTOR W/GRIP ACE CUP 52MM Serial no.:  Model/Cat no.: [id number]  Implant name: PIN SECTOR W/GRIP ACE CUP 52MM - [id number] Laterality: Left Area: Hip  Manufacturer: [employer] Date of Manufacture:    Action: Implanted Number Used: 1   Device Identifier:  Device Identifier Type:     LINER NEUTRAL 52X36MM PLUS 4 - [id number]  Inventory Item: LINER NEUTRAL 52X36MM PLUS 4 Serial no.:  Model/Cat no.: [id number]  Implant name: LINER NEUTRAL 52X36MM PLUS 4 - [id number] Laterality: Left Area: Hip  Manufacturer: [employer] Date of Manufacture:    Action: Implanted Number Used: 1   Device Identifier:  Device Identifier TypeBrock Bad APEX DEPUY - [id number]  Inventory Item: ELIMINATOR HOLE APEX DEPUY Serial no.:  Model/Cat no.: [id number]  Implant nameBrock Bad APEX DEPUY - [id number] Laterality: Left Area: Hip  Manufacturer: [employer]mann Date of Manufacture:    Action: Implanted Number Used: 1   Device Identifier:  Device Identifier Type:     STEM FEMORAL SZ6 HIGH ACTIS - [id number]  Inventory Item: STEM FEMORAL SZ6 HIGH ACTIS Serial no.:  Model/Cat no.: [id number]  Implant name: STEM FEMORAL SZ6 HIGH ACTIS - [id number] Laterality: Left Area: Hip  Manufacturer: [place] Date of Manufacture:    Action: Implanted Number Used: 1   Device Identifier:  Device Identifier Type:     HEAD CERAMIC DELTA 36 PLUS 1.5 - [id number]  Inventory Item: HEAD CERAMIC DELTA 36 PLUS 1.5 Serial no.:  Model/Cat no.: [id number]  Implant name: HEAD CERAMIC DELTA 36 PLUS 1.5  - [id number] Laterality: Left Area: Hip  Manufacturer: [employer] Date of Manufacture:    Action: Implanted Number Used: 1   Device Identifier:  Device Identifier Type:    Procedure after induction of spinal anesthesia Hana boots were placed Foley catheter placed patient was placed on the Hana bed careful low positioning C-arm was brought in.  Patient noted #5 stem 50 mm cup on the opposite side.  Good visualization prepping with DuraPrep after 1015 drapes were applied abdomen was brought over with tape.  Sterile skin marker large shower curtain Betadine Steri-Drape application hydraulic post was placed cutting on the drape securing and covering with a piece of [place]oban.  Timeout procedures completed IV TXA was given.  Ancef was given prophylactically 2 g.  Direct anterior approach was made starting 1 fingerbreadth lateral 1 inferior to ASIS obliquely over the trochanter fascia was identified cleaned with a Cobb nicked extended in line with skin incision and then dull cobra placed anteriorly over the anterior capsule.  Capsule was divided transverse vessels coagulated.  Enter capsule was opened there was clear fluid and severe degenerative arthritis present.  Neck was cut with C-arm visualization 1 fingerbreadth above the lesser trochanter hip was removed with some difficulty due to the deformity of the head and acetabulum and spurring.  66 degree Hohmann was placed internally over the capsule and sequential reaming and on the left side 52 cup was selected and 51 reamer gave nice secure fit medialization 50 good cup was placed and no dome screws were required.  Apex illuminator was secured tight cup  was tight +4 neutral liner was impacted.  Hydraulic arm applied leg was externally rotated 110 taken down and under and then preparation for the femur.  We progressed on the femur up and the opposite hip had a #5 stem.  On the left side a #6 stem was selected based on tight fit with a #6 broach.  Trial showed  there is good stability with a +1.5 she might of been 1 or 2 mm long but her neck cut looked good and +1.5 neck length was selected.  Permanent Actis stem was selected #6+1.5 ceramic ball identical with a ceramic ball on the opposite side.  Good stability external rotation 90 degrees leg could be taken straight down to the floor with no subluxation.  Copious irrigation.  There is slight bleeding from the posterior capsular division.  Sponges placed waited for 5 minutes and then V-Loc closure of the fascia Marcaine Exparel infiltration subcutaneous tissue skin staple closure postop dressing and transfer recovery room.  Postop plan is likely discharge tomorrow since with her opposite hip she went home the day after surgery.  Office follow-up 1 week.

## 2020-12-24 NOTE — Discharge Instructions (Addendum)
INSTRUCTIONS AFTER JOINT REPLACEMENT   Remove items at home which could result in a fall. This includes throw rugs or furniture in walking pathways ICE to the affected joint every three hours while awake for 30 minutes at a time, for at least the first 3-5 days, and then as needed for pain and swelling.  Continue to use ice for pain and swelling. You may notice swelling that will progress down to the foot and ankle.  This is normal after surgery.  Elevate your leg when you are not up walking on it.   Continue to use the breathing machine you got in the hospital (incentive spirometer) which will help keep your temperature down.  It is common for your temperature to cycle up and down following surgery, especially at night when you are not up moving around and exerting yourself.  The breathing machine keeps your lungs expanded and your temperature down.   DIET:  As you were doing prior to hospitalization, we recommend a well-balanced diet.  DRESSING / WOUND CARE / SHOWERING  OK TO SHOWER 48 HOURS POSTOP.  NO TUB SOAKING.  DO NOT APPLY ANY CREAMS OR OINTMENTS TO INCISION.  DAILY DRESSING CHANGES WITH GAUZE AND TAPE.   ACTIVITY  Increase activity slowly as tolerated, but follow the weight bearing instructions below.   No driving for 6 weeks or until further direction given by your physician.  You cannot drive while taking narcotics.  No lifting or carrying greater than 10 lbs. until further directed by your surgeon. Avoid periods of inactivity such as sitting longer than an hour when not asleep. This helps prevent blood clots.  You may return to work once you are authorized by your doctor.     WEIGHT BEARING   Weight bearing as tolerated with assist device (walker, cane, etc) as directed, use it as long as suggested by your surgeon or therapist, typically at least 4-6 weeks.   EXERCISES  Results after joint replacement surgery are often greatly improved when you follow the exercise, range of  motion and muscle strengthening exercises prescribed by your doctor. Safety measures are also important to protect the joint from further injury. Any time any of these exercises cause you to have increased pain or swelling, decrease what you are doing until you are comfortable again and then slowly increase them. If you have problems or questions, call your caregiver or physical therapist for advice.   Rehabilitation is important following a joint replacement. After just a few days of immobilization, the muscles of the leg can become weakened and shrink (atrophy).  These exercises are designed to build up the tone and strength of the thigh and leg muscles and to improve motion. Often times heat used for twenty to thirty minutes before working out will loosen up your tissues and help with improving the range of motion but do not use heat for the first two weeks following surgery (sometimes heat can increase post-operative swelling).  A rehabilitation program following joint replacement surgery can speed recovery and prevent re-injury in the future due to weakened muscles. Contact your doctor or a physical therapist for more information on knee rehabilitation.    CONSTIPATION  Constipation is defined medically as fewer than three stools per week and severe constipation as less than one stool per week.  Even if you have a regular bowel pattern at home, your normal regimen is likely to be disrupted due to multiple reasons following surgery.  Combination of anesthesia, postoperative narcotics, change in  appetite and fluid intake all can affect your bowels.   YOU MUST use at least one of the following options; they are listed in order of increasing strength to get the job done.  They are all available over the counter, and you may need to use some, POSSIBLY even all of these options:    Drink plenty of fluids (prune juice may be helpful) and high fiber foods Colace 100 mg by mouth twice a day  Senokot for  constipation as directed and as needed Dulcolax (bisacodyl), take with full glass of water  Miralax (polyethylene glycol) once or twice a day as needed.  If you have tried all these things and are unable to have a bowel movement in the first 3-4 days after surgery call either your surgeon or your primary doctor.    If you experience loose stools or diarrhea, hold the medications until you stool forms back up.  If your symptoms do not get better within 1 week or if they get worse, check with your doctor.  If you experience "the worst abdominal pain ever" or develop nausea or vomiting, please contact the office immediately for further recommendations for treatment.   ITCHING:  If you experience itching with your medications, try taking only a single pain pill, or even half a pain pill at a time.  You can also use Benadryl over the counter for itching or also to help with sleep.   TED HOSE STOCKINGS:  Use stockings on both legs until for at least 2 weeks or as directed by physician office. They may be removed at night for sleeping.  MEDICATIONS:  See your medication summary on the "After Visit Summary" that nursing will review with you.  You may have some home medications which will be placed on hold until you complete the course of blood thinner medication.  It is important for you to complete the blood thinner medication as prescribed.  PRECAUTIONS:  If you experience chest pain or shortness of breath - call 911 immediately for transfer to the hospital emergency department.   If you develop a fever greater that 101 F, purulent drainage from wound, increased redness or drainage from wound, foul odor from the wound/dressing, or calf pain - CONTACT YOUR SURGEON.                                                   FOLLOW-UP APPOINTMENTS:  If you do not already have a post-op appointment, please call the office for an appointment to be seen by your surgeon.  Guidelines for how soon to be seen are listed in  your "After Visit Summary", but are typically between 1-4 weeks after surgery.  OTHER INSTRUCTIONS:   Knee Replacement:  Do not place pillow under knee, focus on keeping the knee straight while resting. CPM instructions: 0-90 degrees, 2 hours in the morning, 2 hours in the afternoon, and 2 hours in the evening. Place foam block, curve side up under heel at all times except when in CPM or when walking.  DO NOT modify, tear, cut, or change the foam block in any way.  POST-OPERATIVE OPIOID TAPER INSTRUCTIONS: It is important to wean off of your opioid medication as soon as possible. If you do not need pain medication after your surgery it is ok to stop day one. Opioids include: Codeine, Hydrocodone(Norco,  Vicodin), Oxycodone(Percocet, oxycontin) and hydromorphone amongst others.  Long term and even short term use of opiods can cause: Increased pain response Dependence Constipation Depression Respiratory depression And more.  Withdrawal symptoms can include Flu like symptoms Nausea, vomiting And more Techniques to manage these symptoms Hydrate well Eat regular healthy meals Stay active Use relaxation techniques(deep breathing, meditating, yoga) Do Not substitute Alcohol to help with tapering If you have been on opioids for less than two weeks and do not have pain than it is ok to stop all together.  Plan to wean off of opioids This plan should start within one week post op of your joint replacement. Maintain the same interval or time between taking each dose and first decrease the dose.  Cut the total daily intake of opioids by one tablet each day Next start to increase the time between doses. The last dose that should be eliminated is the evening dose.   MAKE SURE YOU:  Understand these instructions.  Get help right away if you are not doing well or get worse.    Thank you for letting us be a part of your medical care team.  It is a privilege we respect greatly.  We hope these  instructions will help you stay on track for a fast and full recovery!     Dental Antibiotics:  In most cases prophylactic antibiotics for Dental procdeures after total joint surgery are not necessary.  Exceptions are as follows:  1. History of prior total joint infection  2. Severely immunocompromised (Organ Transplant, cancer chemotherapy, Rheumatoid biologic meds such as [place])  3. Poorly controlled diabetes (A1C &gt; 8.0, blood glucose over 200)  If you have one of these conditions, contact your surgeon for an antibiotic prescription, prior to your dental procedure.

## 2020-12-24 NOTE — H&P (Signed)
TOTAL HIP ADMISSION H&P  Patient is admitted for left total hip arthroplasty.  Subjective:  Chief Complaint: left hip pain  HPI: Valerie Delgado, 55 y.o. female, has a history of pain and functional disability in the left hip(s) due to arthritis and patient has failed non-surgical conservative treatments for greater than 12 weeks to include NSAID's and/or analgesics, flexibility and strengthening excercises, use of assistive devices, and activity modification.  Onset of symptoms was gradual starting 10 years ago with gradually worsening course since that time.The patient noted no past surgery on the left hip(s).  Patient currently rates pain in the left hip at 10 out of 10 with activity. Patient has night pain, worsening of pain with activity and weight bearing, trendelenberg gait, pain that interfers with activities of daily living, pain with passive range of motion, and crepitus. Patient has evidence of subchondral sclerosis, periarticular osteophytes, and joint space narrowing by imaging studies. This condition presents safety issues increasing the risk of falls. .  There is no current active infection.  Patient Active Problem List   Diagnosis Date Noted   Unilateral primary osteoarthritis, left hip 02/24/2020   Status post total hip replacement, right 01/19/2020   Decreased visual acuity 03/30/2017   Estrogen deficiency 03/30/2017   Inflammatory arthritis 11/13/2016   Essential hypertension 09/19/2016   History of anxiety and depression 09/19/2016   History of Meniere's disease 09/19/2016   History of hearing loss 09/19/2016   History of ADHD 09/19/2016   Dyspareunia, female 01/11/2016   Routine general medical examination at a health care facility 01/11/2016   Smoker 09/01/2015   Anxiety and depression 09/01/2015   Positive QuantiFERON-TB Gold test 09/01/2015   History of herpes simplex infection 09/01/2015   Past Medical History:  Diagnosis Date   ADHD (attention deficit  hyperactivity disorder) 2000   Allergy    Anxiety    Arthritis    Depression    Hypertension    Meniere's disease of left ear    Psoriatic arthritis ([place])    Dr. Estanislado Pandy   Tuberculosis    Latent TB, started treatment on 08/30/2015    Past Surgical History:  Procedure Laterality Date   CESAREAN SECTION     COLONOSCOPY     MOUTH SURGERY  2020   tooth extraction/ bone graft    TOTAL HIP ARTHROPLASTY Right 01/19/2020   Procedure: RIGHT TOTAL HIP ARTHROPLASTY-DIRECT ANTERIOR;  Surgeon: Marybelle Killings, MD;  Location: [facility] [place];  Service: Orthopedics;  Laterality: Right;    Current Facility-Administered Medications  Medication Dose Route Frequency Provider Last Rate Last Admin   bupivacaine liposome (EXPAREL) 1.3 % injection 266 mg  20 mL Infiltration Once Benjiman Core M, PA-C       ceFAZolin (ANCEF) IVPB 2g/100 mL premix  2 g Intravenous On Call to OR Lanae Crumbly, PA-C       lactated ringers infusion   Intravenous Continuous Lidia Collum, MD       tranexamic acid (CYKLOKAPRON) 1000MG /139mL IVPB            Facility-Administered Medications Ordered in Other Encounters  Medication Dose Route Frequency Provider Last Rate Last Admin   lactated ringers infusion   Intravenous Continuous PRN Claris Che, CRNA   New Bag at 12/24/20 6967   Allergies  Allergen Reactions   Elemental Sulfur     Makes stomach upset   Otezla [Apremilast]     Severe depression     Social History   Tobacco Use  Smoking status: Former    Years: 30.00    Types: Cigarettes    Quit date: 08/2019    Years since quitting: 1.3   Smokeless tobacco: Never  Substance Use Topics   Alcohol use: Yes    Alcohol/week: 2.0 standard drinks    Types: 2 Glasses of wine per week    Comment: "a glass of wine in the evening once in a while"    Family History  Problem Relation Age of Onset   Hypertension Mother    Cancer Mother        breast   Arthritis Mother    Depression Mother    Hypertension Father     Hypertension Brother    Stroke Brother    Colon cancer Neg Hx    Esophageal cancer Neg Hx    Rectal cancer Neg Hx    Stomach cancer Neg Hx      Review of Systems  Constitutional:  Positive for activity change.  HENT: Negative.    Respiratory: Negative.    Cardiovascular: Negative.   Genitourinary: Negative.   Musculoskeletal:  Positive for gait problem.  Skin: Negative.   Psychiatric/Behavioral: Negative.     Objective:  Physical Exam HENT:     Head: Normocephalic.     Nose: Nose normal.  Eyes:     Extraocular Movements: Extraocular movements intact.  Cardiovascular:     Rate and Rhythm: Regular rhythm.  Pulmonary:     Effort: Pulmonary effort is normal. No respiratory distress.     Breath sounds: Normal breath sounds.  Musculoskeletal:        General: Tenderness present.     Cervical back: Normal range of motion.  Neurological:     Mental Status: She is alert and oriented to person, place, and time.  Psychiatric:        Mood and Affect: Mood normal.    Vital signs in last 24 hours: Temp:  [97.6 F (36.4 C)] 97.6 F (36.4 C) (11/04 0606) Pulse Rate:  [86] 86 (11/04 0606) Resp:  [17] 17 (11/04 0606) BP: (115)/(68) 115/68 (11/04 0606) SpO2:  [95 %] 95 % (11/04 0606) Weight:  [77.2 kg] 77.2 kg (11/04 0606)  Labs:   Estimated body mass index is 27.47 kg/m as calculated from the following:   Height as of this encounter: 5\' 6"  (1.676 m).   Weight as of this encounter: 77.2 kg.   Imaging Review Plain radiographs demonstrate moderate degenerative joint disease of the left hip(s). The bone quality appears to be good for age and reported activity level.      Assessment/Plan:  End stage arthritis, left hip(s)  The patient history, physical examination, clinical judgement of the provider and imaging studies are consistent with end stage degenerative joint disease of the left hip(s) and total hip arthroplasty is deemed medically necessary. The treatment  options including medical management, injection therapy, arthroscopy and arthroplasty were discussed at length. The risks and benefits of total hip arthroplasty were presented and reviewed. The risks due to aseptic loosening, infection, stiffness, dislocation/subluxation,  thromboembolic complications and other imponderables were discussed.  The patient acknowledged the explanation, agreed to proceed with the plan and consent was signed. Patient is being admitted for inpatient treatment for surgery, pain control, PT, OT, prophylactic antibiotics, VTE prophylaxis, progressive ambulation and ADL's and discharge planning.The patient is planning to be discharged home with home health services    Patient's anticipated LOS is less than 2 midnights, meeting these requirements: - Younger than 65 -  Lives within 1 hour of care - Has a competent adult at home to recover with post-op recover - NO history of  - Chronic pain requiring opiods  - Diabetes  - Coronary Artery Disease  - Heart failure  - Heart attack  - Stroke  - DVT/VTE  - Cardiac arrhythmia  - Respiratory Failure/COPD  - Renal failure  - Anemia  - Advanced Liver disease

## 2020-12-24 NOTE — Anesthesia Procedure Notes (Signed)
Procedure Name: MAC Date/Time: 12/24/2020 7:35 AM Performed by: Claris Che, CRNA Pre-anesthesia Checklist: Patient identified, Emergency Drugs available, Suction available, Patient being monitored and Timeout performed Patient Re-evaluated:Patient Re-evaluated prior to induction Oxygen Delivery Method: Simple face mask Dental Injury: Teeth and Oropharynx as per pre-operative assessment

## 2020-12-24 NOTE — Transfer of Care (Signed)
Immediate Anesthesia Transfer of Care Note  Patient: Colisha Redler  Procedure(s) Performed: LEFT TOTAL HIP ARTHROPLASTY ANTERIOR APPROACH (Left: Hip)  Patient Location: PACU  Anesthesia Type:MAC and Spinal  Level of Consciousness: awake, alert , oriented and patient cooperative  Airway & Oxygen Therapy: Patient Spontanous Breathing and Patient connected to nasal cannula oxygen  Post-op Assessment: Report given to RN and Post -op Vital signs reviewed and stable  Post vital signs: Reviewed and stable  Last Vitals:  Vitals Value Taken Time  BP 93/71 12/24/20 0950  Temp    Pulse 57 12/24/20 0951  Resp 10 12/24/20 0951  SpO2 96 % 12/24/20 0951  Vitals shown include unvalidated device data.  Last Pain:  Vitals:   12/24/20 0606  TempSrc: Oral         Complications: No notable events documented.

## 2020-12-24 NOTE — TOC Initial Note (Addendum)
Transition of Care [facility]) - Initial/Assessment Note    Patient Details  Name: Valerie Delgado MRN: [id number] Date of Birth: 1965-06-05  Transition of Care [facility]) CM/SW Contact:    Sharin Mons, RN Phone Number: 12/24/2020, 4:38 PM  Clinical Narrative:           - s/p L total hip arthroplasty, 11/4  From home with boyfriend, Octavia Bruckner. Supportive family. PTA independent with ADL's, no  DME usage. States has a cane and rolling walker @ home, not interested in [facility] [place].  PT evaluation pending ....  Pt has transportation to home.  Pt agreeable to home health services if needed. Pt without preference. Referral made with [facility] and accepted pending MD orders.  States no problems affording Rx meds.  TOC team will continue to monitor and assist with needs.  Expected Discharge Plan: Home/Self Care (vs home health) Barriers to Discharge: Continued Medical Work up   Patient Goals and CMS Choice        Expected Discharge Plan and Services Expected Discharge Plan: Home/Self Care (vs home health)                                              Prior Living Arrangements/Services                       Activities of Daily Living Home Assistive Devices/Equipment: Eyeglasses, Environmental consultant (specify type) ADL Screening (condition at time of admission) Patient's cognitive ability adequate to safely complete daily activities?: Yes Is the patient deaf or have difficulty hearing?: Yes Does the patient have difficulty seeing, even when wearing glasses/contacts?: No Does the patient have difficulty concentrating, remembering, or making decisions?: No Patient able to express need for assistance with ADLs?: Yes Does the patient have difficulty dressing or bathing?: Yes Independently performs ADLs?: Yes (appropriate for developmental age) Does the patient have difficulty walking or climbing stairs?: Yes Weakness of Legs: Left Weakness of Arms/Hands: Both  Permission  Sought/Granted                  Emotional Assessment              Admission diagnosis:  Osteoarthritis of hip [M16.9] Arthritis of left hip [M16.12] Patient Active Problem List   Diagnosis Date Noted   Osteoarthritis of hip 12/24/2020   Arthritis of left hip 12/24/2020   Unilateral primary osteoarthritis, left hip 02/24/2020   Status post total hip replacement, right 01/19/2020   Decreased visual acuity 03/30/2017   Estrogen deficiency 03/30/2017   Inflammatory arthritis 11/13/2016   Essential hypertension 09/19/2016   History of anxiety and depression 09/19/2016   History of Meniere's disease 09/19/2016   History of hearing loss 09/19/2016   History of ADHD 09/19/2016   Dyspareunia, female 01/11/2016   Routine general medical examination at a health care facility 01/11/2016   Smoker 09/01/2015   Anxiety and depression 09/01/2015   Positive QuantiFERON-TB Gold test 09/01/2015   History of herpes simplex infection 09/01/2015   PCP:  Glenis Smoker, MD Pharmacy:   [id number] [address] [place] [address] Phone: [phone] Fax: [phone]  [place], [place] [address] Phone: [phone]  Fax: [phone]     Social Determinants of Health (SDOH) Interventions    Readmission Risk Interventions No flowsheet data found.

## 2020-12-24 NOTE — Anesthesia Preprocedure Evaluation (Addendum)
Anesthesia Evaluation  Patient identified by MRN, date of birth, ID band Patient awake    Reviewed: Allergy & Precautions, NPO status , Patient's Chart, lab work & pertinent test results  History of Anesthesia Complications Negative for: history of anesthetic complications  Airway Mallampati: II  TM Distance: >3 FB Neck ROM: Full    Dental  (+) Teeth Intact   Pulmonary neg pulmonary ROS, former smoker,    Pulmonary exam normal        Cardiovascular hypertension, Pt. on medications Normal cardiovascular exam     Neuro/Psych Anxiety Depression negative neurological ROS     GI/Hepatic negative GI ROS, Neg liver ROS,   Endo/Other  negative endocrine ROS  Renal/GU negative Renal ROS  negative genitourinary   Musculoskeletal  (+) Arthritis ,   Abdominal   Peds  Hematology negative hematology ROS (+)   Anesthesia Other Findings  plt 280, no ACs  Reproductive/Obstetrics                            Anesthesia Physical Anesthesia Plan  ASA: 2  Anesthesia Plan: Spinal   Post-op Pain Management:    Induction:   PONV Risk Score and Plan: 2 and Propofol infusion, Treatment may vary due to age or medical condition, Ondansetron and TIVA  Airway Management Planned: Nasal Cannula and Simple Face Mask  Additional Equipment: None  Intra-op Plan:   Post-operative Plan:   Informed Consent: I have reviewed the patients History and Physical, chart, labs and discussed the procedure including the risks, benefits and alternatives for the proposed anesthesia with the patient or authorized representative who has indicated his/her understanding and acceptance.       Plan Discussed with:   Anesthesia Plan Comments:         Anesthesia Quick Evaluation

## 2020-12-24 NOTE — Anesthesia Procedure Notes (Signed)
Spinal  Patient location during procedure: OR Reason for block: surgical anesthesia Staffing Performed: anesthesiologist  Anesthesiologist: Anastasiya Gowin E, MD Preanesthetic Checklist Completed: patient identified, IV checked, risks and benefits discussed, surgical consent, monitors and equipment checked, pre-op evaluation and timeout performed Spinal Block Patient position: sitting Prep: DuraPrep and site prepped and draped Patient monitoring: continuous pulse ox, blood pressure and heart rate Approach: midline Location: L3-4 Injection technique: single-shot Needle Needle type: Pencan  Needle gauge: 24 G Needle length: 9 cm Assessment Events: CSF return Additional Notes Functioning IV was confirmed and monitors were applied. Sterile prep and drape, including hand hygiene and sterile gloves were used. The patient was positioned and the spine was prepped. The skin was anesthetized with lidocaine.  Free flow of clear CSF was obtained prior to injecting local anesthetic into the CSF. The needle was carefully withdrawn. The patient tolerated the procedure well.     

## 2020-12-24 NOTE — Anesthesia Postprocedure Evaluation (Signed)
Anesthesia Post Note  Patient: Niani Mourer  Procedure(s) Performed: LEFT TOTAL HIP ARTHROPLASTY ANTERIOR APPROACH (Left: Hip)     Patient location during evaluation: PACU Anesthesia Type: Spinal Level of consciousness: oriented and awake and alert Pain management: pain level controlled Vital Signs Assessment: post-procedure vital signs reviewed and stable Respiratory status: spontaneous breathing, respiratory function stable and nonlabored ventilation Cardiovascular status: blood pressure returned to baseline and stable Postop Assessment: no headache, no backache, no apparent nausea or vomiting and spinal receding Anesthetic complications: no   No notable events documented.  Last Vitals:  Vitals:   12/24/20 1120 12/24/20 1150  BP: 122/76 110/83  Pulse: 72 85  Resp: 19 16  Temp:    SpO2: 95% 94%    Last Pain:  Vitals:   12/24/20 1150  TempSrc:   PainSc: 0-No pain                 Lidia Collum

## 2020-12-24 NOTE — Interval H&P Note (Signed)
History and Physical Interval Note:  12/24/2020 7:24 AM  Valerie Delgado  has presented today for surgery, with the diagnosis of left hip osteoarthritis.  The various methods of treatment have been discussed with the patient and family. After consideration of risks, benefits and other options for treatment, the patient has consented to  Procedure(s): LEFT TOTAL HIP ARTHROPLASTY ANTERIOR APPROACH (Left) as a surgical intervention.  The patient's history has been reviewed, patient examined, no change in status, stable for surgery.  I have reviewed the patient's chart and labs.  Questions were answered to the patient's satisfaction.     Marybelle Killings

## 2020-12-24 NOTE — Evaluation (Signed)
Physical Therapy Evaluation Patient Details Name: Valerie Delgado MRN: [id number] DOB: 1965/07/31 Today's Date: 12/24/2020  History of Present Illness  Pt is 55 yo female admitted on 12/24/20 for L anterior THA.  She has hx including R THA, HTN, Meniere's disease, hearing loss, ADHD, anxiety and depression  Clinical Impression  Pt is s/p THA resulting in the deficits listed below (see PT Problem List). At baseline, pt is very independent.  She will have supervision from significant other at d/c and has DME.  Today, she ambulated 17' in room.  She has c/o low back pain (chronic) and L thigh pain - pain relieved when OOB but she was uncomfortable in chair and had to return to bed.  Pt expected to progress well.  Pt will benefit from skilled PT to increase their independence and safety with mobility to allow discharge to the venue listed below.         Recommendations for follow up therapy are one component of a multi-disciplinary discharge planning process, led by the attending physician.  Recommendations may be updated based on patient status, additional functional criteria and insurance authorization.  Follow Up Recommendations Follow physician's recommendations for discharge plan and follow up therapies    Assistance Recommended at Discharge Intermittent Supervision/Assistance  Functional Status Assessment Patient has had a recent decline in their functional status and demonstrates the ability to make significant improvements in function in a reasonable and predictable amount of time.  Equipment Recommendations  None recommended by PT    Recommendations for Other Services       Precautions / Restrictions Precautions Precautions: Fall Restrictions Weight Bearing Restrictions: Yes LLE Weight Bearing: Weight bearing as tolerated      Mobility  Bed Mobility Overal bed mobility: Needs Assistance Bed Mobility: Supine to Sit;Sit to Supine     Supine to sit: Min assist Sit to supine: Min  assist   General bed mobility comments: cues and assist for L LE    Transfers Overall transfer level: Needs assistance Equipment used: Rolling walker (2 wheels) Transfers: Sit to/from Stand Sit to Stand: Supervision           General transfer comment: performed x 2; mildly impulsive/eager - standing before therapist beside of her    Ambulation/Gait Ambulation/Gait assistance: Min guard Gait Distance (Feet): 16 Feet (16' then 4') Assistive device: Rolling walker (2 wheels) Gait Pattern/deviations: Step-to pattern;Decreased stride length;Decreased weight shift to left Gait velocity: decreased   General Gait Details: Pt with limited weight on L LE and with hopping gait.  Cued for sequencing and stepping with L leg even if not putting much weight on it - able to improve  Stairs            Wheelchair Mobility    Modified Rankin (Stroke Patients Only)       Balance Overall balance assessment: Needs assistance Sitting-balance support: No upper extremity supported Sitting balance-Leahy Scale: Good     Standing balance support: No upper extremity supported;Bilateral upper extremity supported Standing balance-Leahy Scale: Fair Standing balance comment: RW to ambulate but could static stand wtihout AD ; steady no LOB                             Pertinent Vitals/Pain Pain Assessment: 0-10 Pain Score: 7  Pain Location: L thigh and low back Pain Descriptors / Indicators: Discomfort Pain Intervention(s): Limited activity within patient's tolerance;Monitored during session;Repositioned;Premedicated before session (initially relieved when OOB but uncomfortable  in chair so returned to bed)    Home Living Family/patient expects to be discharged to:: Private residence Living Arrangements: Spouse/significant other (boyfriend) Available Help at Discharge: Family;Available 24 hours/day Type of Home: House Home Access: Stairs to enter Entrance Stairs-Rails:  Psychiatric nurse of Steps: 4   Home Layout: Multi-level;Able to live on main level with bedroom/bathroom Home Equipment: Rolling Walker (2 wheels);Shower seat - built in      Prior Function Prior Level of Function : Independent/Modified Independent;Driving             Mobility Comments: Pt independent with community ambulation ADLs Comments: independent with ADLs and IADLs     Hand Dominance        Extremity/Trunk Assessment   Upper Extremity Assessment Upper Extremity Assessment: Overall WFL for tasks assessed    Lower Extremity Assessment Lower Extremity Assessment: LLE deficits/detail;RLE deficits/detail RLE Deficits / Details: ROM WFL; MMT 5/5 LLE Deficits / Details: ROM WFL; MMT ankle 5/5, knee 3/5, hip 2/5    Cervical / Trunk Assessment Cervical / Trunk Assessment: Normal  Communication   Communication: No difficulties  Cognition Arousal/Alertness: Awake/alert Behavior During Therapy: WFL for tasks assessed/performed Overall Cognitive Status: Within Functional Limits for tasks assessed                                          General Comments General comments (skin integrity, edema, etc.): VSS; provided with ice pack for hip and back; multiple pillows to position for comfort    Exercises     Assessment/Plan    PT Assessment Patient needs continued PT services  PT Problem List Decreased strength;Decreased mobility;Decreased range of motion;Decreased activity tolerance;Decreased balance;Decreased knowledge of use of DME;Pain       PT Treatment Interventions DME instruction;Therapeutic activities;Gait training;Therapeutic exercise;Patient/family education;Stair training;Balance training;Functional mobility training;Modalities    PT Goals (Current goals can be found in the Care Plan section)  Acute Rehab PT Goals Patient Stated Goal: return home PT Goal Formulation: With patient Time For Goal Achievement:  01/07/21 Potential to Achieve Goals: Good    Frequency 7X/week   Barriers to discharge        Co-evaluation               AM-PAC PT "6 Clicks" Mobility  Outcome Measure Help needed turning from your back to your side while in a flat bed without using bedrails?: A Little Help needed moving from lying on your back to sitting on the side of a flat bed without using bedrails?: A Little Help needed moving to and from a bed to a chair (including a wheelchair)?: A Little Help needed standing up from a chair using your arms (e.g., wheelchair or bedside chair)?: A Little Help needed to walk in hospital room?: A Little Help needed climbing 3-5 steps with a railing? : A Little 6 Click Score: 18    End of Session Equipment Utilized During Treatment: Gait belt Activity Tolerance: Patient tolerated treatment well Patient left: in bed;with call bell/phone within reach;with bed alarm set;with SCD's reapplied Nurse Communication: Mobility status PT Visit Diagnosis: Other abnormalities of gait and mobility (R26.89);Muscle weakness (generalized) (M62.81)    Time: 8185-6314 PT Time Calculation (min) (ACUTE ONLY): 28 min   Charges:   PT Evaluation $PT Eval Low Complexity: 1 Low PT Treatments $Gait Training: 8-22 mins        Talulah Schirmer, PT Acute  Rehab Services Pager [phone] [facility] [place] 12/24/2020, 4:43 PM

## 2020-12-24 NOTE — Progress Notes (Signed)
Patient ID: Valerie Delgado, female   DOB: 1965/06/21, 55 y.o.   MRN: [id number] Plan overnight stay post left total hip arthroplasty.  She will go home on Percocet Robaxin and 1 aspirin a day for 1 month.  In November 2021 with her opposite hip she went home the following day.  She already has a walker no additional equipment needed.  She will not require home health physical therapy and will just walk daily until she returns to see me in 1 week.

## 2020-12-24 NOTE — Plan of Care (Signed)

## 2020-12-25 ENCOUNTER — Other Ambulatory Visit: Payer: Self-pay | Admitting: Specialist

## 2020-12-25 ENCOUNTER — Other Ambulatory Visit: Payer: Self-pay

## 2020-12-25 DIAGNOSIS — M1612 Unilateral primary osteoarthritis, left hip: Secondary | ICD-10-CM | POA: Diagnosis not present

## 2020-12-25 LAB — BASIC METABOLIC PANEL
Anion gap: 6 (ref 5–15)
BUN: 10 mg/dL (ref 6–20)
CO2: 26 mmol/L (ref 22–32)
Calcium: 8.5 mg/dL — ABNORMAL LOW (ref 8.9–10.3)
Chloride: 101 mmol/L (ref 98–111)
Creatinine, Ser: 0.59 mg/dL (ref 0.44–1.00)
GFR, Estimated: >60 mL/min (ref >60)
Glucose, Bld: 123 mg/dL — ABNORMAL HIGH (ref 70–99)
Potassium: 4.4 mmol/L (ref 3.5–5.1)
Sodium: 133 mmol/L — ABNORMAL LOW (ref 135–145)

## 2020-12-25 LAB — CBC
HCT: 31 % — ABNORMAL LOW (ref 36.0–46.0)
Hemoglobin: 10.2 g/dL — ABNORMAL LOW (ref 12.0–15.0)
MCH: 30.7 pg (ref 26.0–34.0)
MCHC: 32.9 g/dL (ref 30.0–36.0)
MCV: 93.4 fL (ref 80.0–100.0)
Platelets: 289 10*3/uL (ref 150–400)
RBC: 3.32 MIL/uL — ABNORMAL LOW (ref 3.87–5.11)
RDW: 13.1 % (ref 11.5–15.5)
WBC: 16.1 10*3/uL — ABNORMAL HIGH (ref 4.0–10.5)
nRBC: 0 % (ref 0.0–0.2)

## 2020-12-25 NOTE — Progress Notes (Signed)
     Subjective: 1 Day Post-Op Procedure(s) (LRB): LEFT TOTAL HIP ARTHROPLASTY ANTERIOR APPROACH (Left) Awake, alert and oriented x 4. Ready to go home. This is her second THA and she left on POD#1 last hip.  Patient reports pain as moderate.    Objective:   VITALS:  Temp:  [98.1 F (36.7 C)-98.8 F (37.1 C)] 98.8 F (37.1 C) (11/05 0900) Pulse Rate:  [60-92] 84 (11/05 0900) Resp:  [15-19] 17 (11/05 0900) BP: (91-122)/(67-84) 121/83 (11/05 0900) SpO2:  [90 %-98 %] 93 % (11/05 0900)  Neurologically intact ABD soft Neurovascular intact Sensation intact distally Intact pulses distally Dorsiflexion/Plantar flexion intact Incision: scant drainage   LABS Recent Labs    12/25/20 0507  HGB 10.2*  WBC 16.1*  PLT 289   Recent Labs    12/25/20 0507  NA 133*  K 4.4  CL 101  CO2 26  BUN 10  CREATININE 0.59  GLUCOSE 123*   No results for input(s): LABPT, INR in the last 72 hours.   Assessment/Plan: 1 Day Post-Op Procedure(s) (LRB): LEFT TOTAL HIP ARTHROPLASTY ANTERIOR APPROACH (Left)  Advance diet Up with therapy D/C IV fluids Discharge home   Basil Dess 12/25/2020, 10:14 AM Patient ID: Valerie Delgado, female   DOB: [birth year], 55 y.o.   MRN: [id number]

## 2020-12-25 NOTE — Progress Notes (Signed)
Mobility Specialist Progress Note   12/25/20 1230  Mobility  Activity Transferred:  Bed to chair  Level of Assistance Modified independent, requires aide device or extra time  Assistive Device Front wheel walker  LLE Weight Bearing WBAT  Distance Ambulated (ft) 4 ft  Mobility Ambulated independently in room  Mobility Response Tolerated well  Mobility performed by Mobility specialist  $Mobility charge 1 Mobility   Pt had no complaints during transfer and able to perform ADL's while standing (getting dressed). Left call bell by side and all needs were met.   Holland Falling Mobility Specialist Phone Number [phone]

## 2020-12-25 NOTE — Progress Notes (Signed)
Physical Therapy Treatment Patient Details Name: Valerie Delgado MRN: [id number] DOB: [birth year] Today's Date: 12/25/2020   History of Present Illness Pt is 55 yo female admitted on 12/24/20 for L anterior THA.  She has hx including R THA, HTN, Meniere's disease, hearing loss, ADHD, anxiety and depression    PT Comments    Continuing work on functional mobility and activity tolerance;  Session focused on progressive amb, and initiated stair training in prep for dc home; Showing improved weight acceptance on LLE in stance, and able to smooth out gait pattern with RW; Will plan for stair training during PM session; on track for dc home today    Recommendations for follow up therapy are one component of a multi-disciplinary discharge planning process, led by the attending physician.  Recommendations may be updated based on patient status, additional functional criteria and insurance authorization.  Follow Up Recommendations  Follow physician's recommendations for discharge plan and follow up therapies     Assistance Recommended at Discharge Intermittent Supervision/Assistance  Equipment Recommendations  None recommended by PT    Recommendations for Other Services       Precautions / Restrictions Precautions Precautions: Fall Precaution Comments: Fall risk is present, but minimal, especially with use of RW Restrictions Weight Bearing Restrictions: Yes LLE Weight Bearing: Weight bearing as tolerated     Mobility  Bed Mobility Overal bed mobility: Needs Assistance Bed Mobility: Supine to Sit     Supine to sit: Min assist     General bed mobility comments: cues for technqiue, and to use belt for assist for L LE as needed    Transfers Overall transfer level: Needs assistance Equipment used: Rolling walker (2 wheels) Transfers: Sit to/from Stand Sit to Stand: Supervision           General transfer comment: performed x 2; mildly impulsive/eager - standing before therapist  beside of her    Ambulation/Gait Ambulation/Gait assistance: Min guard Gait Distance (Feet): 200 Feet Assistive device: Rolling walker (2 wheels) Gait Pattern/deviations: Step-through pattern (emerging) Gait velocity: decreased   General Gait Details: Much better weight acceptance LLE in stance; able to more normalize gait pattern with simple cues for smoothing out step length; occasional reminders to incr L heel strike   Stairs         General stair comments: Discussed technique for stair negotiation, and initiated stair training in stairwell; ultimately, pt became overwhelmed with O2 sat monitor and IV tubing management (not necessarily the steps), and we opted to have another session for stair management; Jun, RN and Dr. Louanne Skye aware   Wheelchair Mobility    Modified Rankin (Stroke Patients Only)       Balance     Sitting balance-Leahy Scale: Good       Standing balance-Leahy Scale: Fair Standing balance comment: RW to ambulate but could static stand wtihout AD ; steady no LOB                            Cognition Arousal/Alertness: Awake/alert Behavior During Therapy: WFL for tasks assessed/performed;Restless;Impulsive (seems easily overwhelmed) Overall Cognitive Status: Within Functional Limits for tasks assessed                                 General Comments: Impulsive        Exercises Total Joint Exercises Quad Sets: AROM;Left;5 reps Short Arc Quad: AROM;Left;5 reps Hip ABduction/ADduction: AAROM;Left;5  reps    General Comments General comments (skin integrity, edema, etc.): Ended session with pt sitting on commode in bathroom, call string within reach; RN, NT, and Nursing Student aware      Pertinent Vitals/Pain Pain Assessment: Faces Faces Pain Scale: Hurts even more Pain Location: L thigh and low back Pain Descriptors / Indicators: Discomfort Pain Intervention(s): Premedicated before session;Monitored during session     Home Living                          Prior Function            PT Goals (current goals can now be found in the care plan section) Acute Rehab PT Goals Patient Stated Goal: return home PT Goal Formulation: With patient Time For Goal Achievement: 01/07/21 Potential to Achieve Goals: Good Progress towards PT goals: Progressing toward goals    Frequency    7X/week      PT Plan Current plan remains appropriate    Co-evaluation              AM-PAC PT "6 Clicks" Mobility   Outcome Measure  Help needed turning from your back to your side while in a flat bed without using bedrails?: A Little Help needed moving from lying on your back to sitting on the side of a flat bed without using bedrails?: A Little Help needed moving to and from a bed to a chair (including a wheelchair)?: A Little Help needed standing up from a chair using your arms (e.g., wheelchair or bedside chair)?: None Help needed to walk in hospital room?: None Help needed climbing 3-5 steps with a railing? : A Little 6 Click Score: 20    End of Session Equipment Utilized During Treatment: Gait belt Activity Tolerance: Patient tolerated treatment well Patient left: with call bell/phone within reach;Other (comment) (in bathroom) Nurse Communication: Mobility status PT Visit Diagnosis: Other abnormalities of gait and mobility (R26.89);Muscle weakness (generalized) (M62.81)     Time: 6384-6659 PT Time Calculation (min) (ACUTE ONLY): 30 min  Charges:  $Gait Training: 23-37 mins                     Roney Marion, [place] Pager [phone] Office [place] 12/25/2020, 11:12 AM

## 2020-12-25 NOTE — Progress Notes (Signed)
Physical Therapy Treatment Patient Details Name: Valerie Delgado MRN: [id number] DOB: [birth year] Today's Date: 12/25/2020   History of Present Illness Pt is 55 yo female admitted on 12/24/20 for L anterior THA.  She has hx including R THA, HTN, Meniere's disease, hearing loss, ADHD, anxiety and depression    PT Comments    Continuing work on functional mobility and activity tolerance;  session focused on stair negotiation in prep for dc home; Gait was stiff, but steady, and pt is OK for dc home from PT standpoint    Recommendations for follow up therapy are one component of a multi-disciplinary discharge planning process, led by the attending physician.  Recommendations may be updated based on patient status, additional functional criteria and insurance authorization.  Follow Up Recommendations  Follow physician's recommendations for discharge plan and follow up therapies     Assistance Recommended at Discharge Intermittent Supervision/Assistance  Equipment Recommendations  None recommended by PT    Recommendations for Other Services       Precautions / Restrictions Precautions Precautions: Fall Precaution Comments: Fall risk is present, but minimal, especially with use of RW Restrictions LLE Weight Bearing: Weight bearing as tolerated     Mobility  Bed Mobility Overal bed mobility: Needs Assistance Bed Mobility: Supine to Sit     Supine to sit: Min assist     General bed mobility comments: cues for technqiue, and to use belt for assist for L LE as needed    Transfers Overall transfer level: Needs assistance Equipment used: Rolling walker (2 wheels) Transfers: Sit to/from Stand Sit to Stand: Supervision           General transfer comment: Slow rise, but no need for physical assist    Ambulation/Gait Ambulation/Gait assistance: Min guard;Supervision Gait Distance (Feet): 200 Feet Assistive device: Rolling walker (2 wheels) Gait Pattern/deviations: Step-through  pattern (emerging) Gait velocity: decreased   General Gait Details: minguard progressing to Supervision; accepting more weight on LLE; noting LLE very stiff during advancement   Stairs Stairs: Yes Stairs assistance: Min assist Stair Management: One rail Left;Forwards (with other arm suported around PT's shoulder) Number of Stairs: 5 General stair comments: Went up and down 5 steps with step-to pattern, and min assist; one arm on rail, and other around PT's shoulder; Pt indicated her husband can give that level of assist, and she is confident in ability to go up and down steps   Wheelchair Mobility    Modified Rankin (Stroke Patients Only)       Balance     Sitting balance-Leahy Scale: Good       Standing balance-Leahy Scale: Fair Standing balance comment: RW to ambulate but could static stand wtihout AD ; steady no LOB                            Cognition Arousal/Alertness: Awake/alert Behavior During Therapy: WFL for tasks assessed/performed;Restless;Impulsive (seems easily overwhelmed) Overall Cognitive Status: Within Functional Limits for tasks assessed                                 General Comments: Impulsive        Exercises Total Joint Exercises Quad Sets: AROM;Left;5 reps Short Arc Quad: AROM;Left;5 reps Hip ABduction/ADduction: AAROM;Left;5 reps    General Comments General comments (skin integrity, edema, etc.): Ended session with pt sitting on commode in bathroom, call string within reach; RN, NT,  and Nursing Student aware      Pertinent Vitals/Pain Pain Assessment: Faces Faces Pain Scale: Hurts little more Pain Location: L thigh and low back Pain Descriptors / Indicators: Discomfort Pain Intervention(s): Monitored during session    Home Living                          Prior Function            PT Goals (current goals can now be found in the care plan section) Acute Rehab PT Goals Patient Stated Goal:  return home PT Goal Formulation: With patient Time For Goal Achievement: 01/07/21 Potential to Achieve Goals: Good Progress towards PT goals: Progressing toward goals    Frequency    7X/week      PT Plan Current plan remains appropriate    Co-evaluation              AM-PAC PT "6 Clicks" Mobility   Outcome Measure  Help needed turning from your back to your side while in a flat bed without using bedrails?: A Little Help needed moving from lying on your back to sitting on the side of a flat bed without using bedrails?: A Little Help needed moving to and from a bed to a chair (including a wheelchair)?: A Little Help needed standing up from a chair using your arms (e.g., wheelchair or bedside chair)?: None Help needed to walk in hospital room?: None Help needed climbing 3-5 steps with a railing? : A Little 6 Click Score: 20    End of Session Equipment Utilized During Treatment: Gait belt Activity Tolerance: Patient tolerated treatment well Patient left: in chair;with call bell/phone within reach Nurse Communication: Mobility status PT Visit Diagnosis: Other abnormalities of gait and mobility (R26.89);Muscle weakness (generalized) (M62.81)     Time: 1350-1403 PT Time Calculation (min) (ACUTE ONLY): 13 min  Charges:  $Gait Training: 8-22 mins                     Roney [place], [place]  Acute Rehabilitation Services Pager [phone] Office [phone]    Colletta [place] 12/25/2020, 2:58 PM

## 2020-12-27 ENCOUNTER — Telehealth: Payer: Self-pay | Admitting: *Deleted

## 2020-12-27 ENCOUNTER — Telehealth: Payer: Self-pay | Admitting: Radiology

## 2020-12-27 NOTE — Telephone Encounter (Signed)
Received call from [facility] on this patient (non-bundle). States they received referral for HHPT at discharge and patient refused services twice. They will disregard referral. I am not sure who ordered HHPT at discharge, but I did read your note from Friday confirming she would not need any HHPT to them and confirmed they should disregard referral. They just wanted you aware. Thanks.

## 2020-12-27 NOTE — Telephone Encounter (Signed)
Per Dr. Louanne Skye, patient requested someone call her today after she was discharged home from the hospital. I spoke with patient. She is doing well, has occasional spasms, but has something to take for that. She did need to make post op appointment, so this was scheduled for her today. She will call if any questions/problems prior to that appointment.

## 2020-12-28 ENCOUNTER — Encounter (HOSPITAL_COMMUNITY): Payer: Self-pay | Admitting: Orthopaedic Surgery

## 2020-12-31 NOTE — Discharge Summary (Signed)
Patient ID: Valerie Delgado MRN: [id number] DOB/AGE: 07-09-1965 55 y.o.  Admit date: 12/24/2020 Discharge date: 12/25/2020  Admission Diagnoses:  Active Problems:   Osteoarthritis of hip   Arthritis of left hip   Discharge Diagnoses:  Active Problems:   Osteoarthritis of hip   Arthritis of left hip  status post Procedure(s): LEFT TOTAL HIP ARTHROPLASTY ANTERIOR APPROACH  Past Medical History:  Diagnosis Date   ADHD (attention deficit hyperactivity disorder) 2000   Allergy    Anxiety    Arthritis    Depression    Hypertension    Meniere's disease of left ear    Psoriatic arthritis ([place])    Dr. Estanislado Pandy   Tuberculosis    Latent TB, started treatment on 08/30/2015    Surgeries: Procedure(s): LEFT TOTAL HIP ARTHROPLASTY ANTERIOR APPROACH on 12/24/2020   Consultants:   Discharged Condition: Improved  Hospital Course: Valerie Delgado is an 55 y.o. female who was admitted 12/24/2020 for operative treatment of left hip djd. Patient failed conservative treatments (please see the history and physical for the specifics) and had severe unremitting pain that affects sleep, daily activities and work/hobbies. After pre-op clearance, the patient was taken to the operating room on 12/24/2020 and underwent  Procedure(s): LEFT TOTAL HIP ARTHROPLASTY ANTERIOR APPROACH.    Patient was given perioperative antibiotics:  Anti-infectives (From admission, onward)    Start     Dose/Rate Route Frequency Ordered Stop   12/24/20 0600  ceFAZolin (ANCEF) IVPB 2g/100 mL premix        2 g 200 mL/hr over 30 Minutes Intravenous On call to O.R. 12/24/20 0559 12/24/20 0753        Patient was given sequential compression devices and early ambulation to prevent DVT.   Patient benefited maximally from hospital stay and there were no complications. At the time of discharge, the patient was urinating/moving their bowels without difficulty, tolerating a regular diet, pain is controlled with oral pain  medications and they have been cleared by PT/OT.   Recent vital signs: No data found.   Recent laboratory studies: No results for input(s): WBC, HGB, HCT, PLT, NA, K, CL, CO2, BUN, CREATININE, GLUCOSE, INR, CALCIUM in the last 72 hours.  Invalid input(s): PT, 2   Discharge Medications:   Allergies as of 12/25/2020       Reactions   Elemental Sulfur    Makes stomach upset   Otezla [apremilast]    Severe depression         Medication List     STOP taking these medications    diclofenac Sodium 1 % Gel Commonly known as: VOLTAREN   ibuprofen 200 MG tablet Commonly known as: ADVIL   ICY HOT EX       TAKE these medications    ALPRAZolam 1 MG tablet Commonly known as: XANAX Take 1 mg by mouth 2 (two) times daily as needed for anxiety.   amLODipine 10 MG tablet Commonly known as: NORVASC Take 10 mg by mouth daily.   amphetamine-dextroamphetamine 15 MG tablet Commonly known as: ADDERALL Take 30 mg by mouth daily.   aspirin EC 325 MG tablet Take 1 tablet (325 mg total) by mouth daily. MUST TAKE AT LEAST 4 WEEKS POSTOP FOR DVT PROPHYLAXIS.   Cosentyx Sensoready (300 MG) 150 MG/ML Soaj Generic drug: Secukinumab (300 MG Dose) INJECT 150 MG (1 PEN) SUBCUTANEOUSLY EVERY 14 (FOURTEEN) DAYS.   DULoxetine 60 MG capsule Commonly known as: CYMBALTA Take 120 mg by mouth daily.   methocarbamol 500 MG  tablet Commonly known as: Robaxin Take 1 tablet (500 mg total) by mouth every 6 (six) hours as needed for muscle spasms.   oxyCODONE-acetaminophen 5-325 MG tablet Commonly known as: PERCOCET/ROXICET Take 1-2 tablets by mouth every 6 (six) hours as needed for severe pain.   pseudoephedrine 30 MG tablet Commonly known as: SUDAFED Take 30 mg by mouth every 4 (four) hours as needed (sinus headache.).   valACYclovir 500 MG tablet Commonly known as: VALTREX TAKE 1 TABLET(500 MG) BY MOUTH TWICE DAILY What changed: See the new instructions.   Vitamin D (Ergocalciferol)  1.25 MG (50000 UNIT) Caps capsule Commonly known as: DRISDOL Take 1 capsule (50,000 Units total) by mouth in the morning and at bedtime.        Diagnostic Studies: DG C-Arm 1-60 Min-No Report  Result Date: 12/24/2020 CLINICAL DATA:  Portable operative imaging for left total hip arthroplasty. EXAM: OPERATIVE LEFT HIP (WITH PELVIS IF PERFORMED) 4 VIEWS TECHNIQUE: Fluoroscopic spot image(s) were submitted for interpretation post-operatively. COMPARISON:  11/16/2020 FINDINGS: Images show placement of a left total hip arthroplasty. Acetabular and femoral components appear well seated and aligned. No evidence of an operative complication. IMPRESSION: 1. Operative imaging for left hip total arthroplasty. Electronically Signed   By: Lajean Manes M.D.   On: 12/24/2020 09:32   DG C-Arm 1-60 Min-No Report  Result Date: 12/24/2020 CLINICAL DATA:  Portable operative imaging for left total hip arthroplasty. EXAM: OPERATIVE LEFT HIP (WITH PELVIS IF PERFORMED) 4 VIEWS TECHNIQUE: Fluoroscopic spot image(s) were submitted for interpretation post-operatively. COMPARISON:  11/16/2020 FINDINGS: Images show placement of a left total hip arthroplasty. Acetabular and femoral components appear well seated and aligned. No evidence of an operative complication. IMPRESSION: 1. Operative imaging for left hip total arthroplasty. Electronically Signed   By: Lajean Manes M.D.   On: 12/24/2020 09:32   DG Hip Port Unilat With Pelvis 1V Left  Result Date: 12/24/2020 CLINICAL DATA:  Left hip arthroplasty EXAM: DG HIP (WITH OR WITHOUT PELVIS) 1V PORT LEFT COMPARISON:  11/16/2020 FINDINGS: Interval left hip arthroplasty. Components project in expected location. No fracture or dislocation. Lateral skin staples. Stable right hip arthroplasty components. IMPRESSION: Left hip arthroplasty without apparent complication. Electronically Signed   By: Lucrezia Europe M.D.   On: 12/24/2020 10:22   DG HIP UNILAT WITH PELVIS 2-3 VIEWS  LEFT  Result Date: 12/24/2020 CLINICAL DATA:  Portable operative imaging for left total hip arthroplasty. EXAM: OPERATIVE LEFT HIP (WITH PELVIS IF PERFORMED) 4 VIEWS TECHNIQUE: Fluoroscopic spot image(s) were submitted for interpretation post-operatively. COMPARISON:  11/16/2020 FINDINGS: Images show placement of a left total hip arthroplasty. Acetabular and femoral components appear well seated and aligned. No evidence of an operative complication. IMPRESSION: 1. Operative imaging for left hip total arthroplasty. Electronically Signed   By: Lajean Manes M.D.   On: 12/24/2020 09:32    Discharge Instructions     Call MD / Call 911   Complete by: As directed    If you experience chest pain or shortness of breath, CALL 911 and be transported to the hospital emergency room.  If you develope a fever above 101 F, pus (white drainage) or increased drainage or redness at the wound, or calf pain, call your surgeon's office.   Constipation Prevention   Complete by: As directed    Drink plenty of fluids.  Prune juice may be helpful.  You may use a stool softener, such as Colace (over the counter) 100 mg twice a day.  Use MiraLax (over  the counter) for constipation as needed.   Diet - low sodium heart healthy   Complete by: As directed    Discharge instructions   Complete by: As directed    INSTRUCTIONS AFTER JOINT REPLACEMENT   o Remove items at home which could result in a fall. This includes throw rugs or furniture in walking pathways o ICE to the affected joint every three hours while awake for 30 minutes at a time, for at least the first 3-5 days, and then as needed for pain and swelling.  Continue to use ice for pain and swelling. You may notice swelling that will progress down to the foot and ankle.  This is normal after surgery.  Elevate your leg when you are not up walking on it.   o Continue to use the breathing machine you got in the hospital (incentive spirometer) which will help keep your  temperature down.  It is common for your temperature to cycle up and down following surgery, especially at night when you are not up moving around and exerting yourself.  The breathing machine keeps your lungs expanded and your temperature down.   DIET:  As you were doing prior to hospitalization, we recommend a well-balanced diet.  DRESSING / WOUND CARE / SHOWERING  OK TO SHOWER 48 HOURS POSTOP.  NO TUB SOAKING.  DO NOT APPLY ANY CREAMS OR OINTMENTS TO INCISION.  DAILY DRESSING CHANGES WITH GAUZE AND TAPE.   ACTIVITY  o Increase activity slowly as tolerated, but follow the weight bearing instructions below.   o No driving for 6 weeks or until further direction given by your physician.  You cannot drive while taking narcotics.  o No lifting or carrying greater than 10 lbs. until further directed by your surgeon. o Avoid periods of inactivity such as sitting longer than an hour when not asleep. This helps prevent blood clots.  o You may return to work once you are authorized by your doctor.     WEIGHT BEARING   Weight bearing as tolerated with assist device (walker, cane, etc) as directed, use it as long as suggested by your surgeon or therapist, typically at least 4-6 weeks.   EXERCISES  Results after joint replacement surgery are often greatly improved when you follow the exercise, range of motion and muscle strengthening exercises prescribed by your doctor. Safety measures are also important to protect the joint from further injury. Any time any of these exercises cause you to have increased pain or swelling, decrease what you are doing until you are comfortable again and then slowly increase them. If you have problems or questions, call your caregiver or physical therapist for advice.   Rehabilitation is important following a joint replacement. After just a few days of immobilization, the muscles of the leg can become weakened and shrink (atrophy).  These exercises are designed to  build up the tone and strength of the thigh and leg muscles and to improve motion. Often times heat used for twenty to thirty minutes before working out will loosen up your tissues and help with improving the range of motion but do not use heat for the first two weeks following surgery (sometimes heat can increase post-operative swelling).  A rehabilitation program following joint replacement surgery can speed recovery and prevent re-injury in the future due to weakened muscles. Contact your doctor or a physical therapist for more information on knee rehabilitation.    CONSTIPATION  Constipation is defined medically as fewer than three stools per week  and severe constipation as less than one stool per week.  Even if you have a regular bowel pattern at home, your normal regimen is likely to be disrupted due to multiple reasons following surgery.  Combination of anesthesia, postoperative narcotics, change in appetite and fluid intake all can affect your bowels.   YOU MUST use at least one of the following options; they are listed in order of increasing strength to get the job done.  They are all available over the counter, and you may need to use some, POSSIBLY even all of these options:    Drink plenty of fluids (prune juice may be helpful) and high fiber foods Colace 100 mg by mouth twice a day  Senokot for constipation as directed and as needed Dulcolax (bisacodyl), take with full glass of water  Miralax (polyethylene glycol) once or twice a day as needed.  If you have tried all these things and are unable to have a bowel movement in the first 3-4 days after surgery call either your surgeon or your primary doctor.    If you experience loose stools or diarrhea, hold the medications until you stool forms back up.  If your symptoms do not get better within 1 week or if they get worse, check with your doctor.  If you experience "the worst abdominal pain ever" or develop nausea or vomiting, please  contact the office immediately for further recommendations for treatment.   ITCHING:  If you experience itching with your medications, try taking only a single pain pill, or even half a pain pill at a time.  You can also use Benadryl over the counter for itching or also to help with sleep.   TED HOSE STOCKINGS:  Use stockings on both legs until for at least 2 weeks or as directed by physician office. They may be removed at night for sleeping.  MEDICATIONS:  See your medication summary on the "After Visit Summary" that nursing will review with you.  You may have some home medications which will be placed on hold until you complete the course of blood thinner medication.  It is important for you to complete the blood thinner medication as prescribed.  PRECAUTIONS:  If you experience chest pain or shortness of breath - call 911 immediately for transfer to the hospital emergency department.   If you develop a fever greater that 101 F, purulent drainage from wound, increased redness or drainage from wound, foul odor from the wound/dressing, or calf pain - CONTACT YOUR SURGEON.                                                   FOLLOW-UP APPOINTMENTS:  If you do not already have a post-op appointment, please call the office for an appointment to be seen by your surgeon.  Guidelines for how soon to be seen are listed in your "After Visit Summary", but are typically between 1-4 weeks after surgery.  OTHER INSTRUCTIONS:   Knee Replacement:  Do not place pillow under knee, focus on keeping the knee straight while resting. CPM instructions: 0-90 degrees, 2 hours in the morning, 2 hours in the afternoon, and 2 hours in the evening. Place foam block, curve side up under heel at all times except when in CPM or when walking.  DO NOT modify, tear, cut, or change the foam block in  any way.  POST-OPERATIVE OPIOID TAPER INSTRUCTIONS:  It is important to wean off of your opioid medication as soon as possible. If you  do not need pain medication after your surgery it is ok to stop day one.  Opioids include:  o Codeine, Hydrocodone(Norco, Vicodin), Oxycodone(Percocet, oxycontin) and hydromorphone amongst others.   Long term and even short term use of opiods can cause:  o Increased pain response  o Dependence  o Constipation  o Depression  o Respiratory depression  o And more.   Withdrawal symptoms can include  o Flu like symptoms  o Nausea, vomiting  o And more  Techniques to manage these symptoms  o Hydrate well  o Eat regular healthy meals  o Stay active  o Use relaxation techniques(deep breathing, meditating, yoga)  Do Not substitute Alcohol to help with tapering  If you have been on opioids for less than two weeks and do not have pain than it is ok to stop all together.   Plan to wean off of opioids  o This plan should start within one week post op of your joint replacement.  o Maintain the same interval or time between taking each dose and first decrease the dose.   o Cut the total daily intake of opioids by one tablet each day  o Next start to increase the time between doses.  o The last dose that should be eliminated is the evening dose.   MAKE SURE YOU:   Understand these instructions.   Get help right away if you are not doing well or get worse.    Thank you for letting us be a part of your medical care team.  It is a privilege we respect greatly.  We hope these instructions will help you stay on track for a fast and full recovery!     Dental Antibiotics:  In most cases prophylactic antibiotics for Dental procdeures after total joint surgery are not necessary.  Exceptions are as follows:  1. History of prior total joint infection  2. Severely immunocompromised (Organ Transplant, cancer chemotherapy, Rheumatoid biologic meds such as [place])  3. Poorly controlled diabetes (A1C &gt; 8.0, blood glucose over 200)  If you have one of these conditions, contact your  surgeon for an antibiotic prescription, prior to your dental procedure.   Driving restrictions   Complete by: As directed    No driving for 2 weeks   Increase activity slowly as tolerated   Complete by: As directed    Lifting restrictions   Complete by: As directed    No lifting for 6 weeks   Post-operative opioid taper instructions:   Complete by: As directed    POST-OPERATIVE OPIOID TAPER INSTRUCTIONS: It is important to wean off of your opioid medication as soon as possible. If you do not need pain medication after your surgery it is ok to stop day one. Opioids include: Codeine, Hydrocodone(Norco, Vicodin), Oxycodone(Percocet, oxycontin) and hydromorphone amongst others.  Long term and even short term use of opiods can cause: Increased pain response Dependence Constipation Depression Respiratory depression And more.  Withdrawal symptoms can include Flu like symptoms Nausea, vomiting And more Techniques to manage these symptoms Hydrate well Eat regular healthy meals Stay active Use relaxation techniques(deep breathing, meditating, yoga) Do Not substitute Alcohol to help with tapering If you have been on opioids for less than two weeks and do not have pain than it is ok to stop all together.  Plan to wean off of opioids This  plan should start within one week post op of your joint replacement. Maintain the same interval or time between taking each dose and first decrease the dose.  Cut the total daily intake of opioids by one tablet each day Next start to increase the time between doses. The last dose that should be eliminated is the evening dose.           Follow-up Information     Marybelle Killings, MD. Schedule an appointment as soon as possible for a visit today.   Specialty: Orthopedic Surgery Why: NEED RETURN OFFICE VISIT 2 WEEKS POSTOP.  CALL TO SCHEDULE APPOINTMENT. Contact information: [address] [place] [address] [phone]                  Discharge Plan:  discharge to home  Disposition:     Signed: Benjiman Core  12/31/2020, 3:56 PM

## 2021-01-04 ENCOUNTER — Ambulatory Visit: Payer: 59 | Admitting: Orthopaedic Surgery

## 2021-01-07 ENCOUNTER — Telehealth: Payer: Self-pay | Admitting: Orthopaedic Surgery

## 2021-01-07 NOTE — Telephone Encounter (Signed)
I spoke with patient. She had received extra waterproof bandage after her last surgery. She has used it as the first bandage got wet on the inside. This one is still dry. I advised as long as it is dry, she can leave it on until follow up on Tuesday. I did offer to put another Mepilex at the front desk for pick up, but she said that she would just leave it until she is seen in the office next week.

## 2021-01-07 NOTE — Telephone Encounter (Signed)
Patient called asked where can she go get an extra bandage? Patient said she did not get an extra bandage like she did when she had the last surgery. The number to contact patient is [phone]

## 2021-01-11 ENCOUNTER — Ambulatory Visit: Payer: Self-pay

## 2021-01-11 ENCOUNTER — Ambulatory Visit (INDEPENDENT_AMBULATORY_CARE_PROVIDER_SITE_OTHER): Payer: 59 | Admitting: Orthopaedic Surgery

## 2021-01-11 ENCOUNTER — Encounter: Payer: Self-pay | Admitting: Orthopaedic Surgery

## 2021-01-11 ENCOUNTER — Other Ambulatory Visit: Payer: Self-pay

## 2021-01-11 VITALS — BP 110/67

## 2021-01-11 DIAGNOSIS — Z96642 Presence of left artificial hip joint: Secondary | ICD-10-CM

## 2021-01-11 DIAGNOSIS — Z9889 Other specified postprocedural states: Secondary | ICD-10-CM

## 2021-01-11 NOTE — Progress Notes (Signed)
Post-Op Visit Note   Patient: Valerie Delgado           Date of Birth: Sep 28, 1965           MRN: [id number] Visit Date: 01/11/2021 PCP: Glenis Smoker, MD   Assessment & Plan:staples out. ROV 6 wks.   Chief Complaint:  Chief Complaint  Patient presents with   Left Hip - Post-op Follow-up   Visit Diagnoses:  1. Post-operative state   2. Hx of total hip arthroplasty, left     Plan: RTW on 01/24/21. Staples removed. ROV 6 wks.   Follow-Up Instructions: No follow-ups on file.   Orders:  Orders Placed This Encounter  Procedures   XR HIP UNILAT W OR W/O PELVIS 2-3 VIEWS LEFT   No orders of the defined types were placed in this encounter.   Imaging: No results found.  PMFS History: Patient Active Problem List   Diagnosis Date Noted   Hx of total hip arthroplasty, left 01/11/2021   Osteoarthritis of hip 12/24/2020   Arthritis of left hip 12/24/2020   Status post total hip replacement, right 01/19/2020   Decreased visual acuity 03/30/2017   Estrogen deficiency 03/30/2017   Inflammatory arthritis 11/13/2016   Essential hypertension 09/19/2016   History of anxiety and depression 09/19/2016   History of Meniere's disease 09/19/2016   History of hearing loss 09/19/2016   History of ADHD 09/19/2016   Dyspareunia, female 01/11/2016   Routine general medical examination at a health care facility 01/11/2016   Smoker 09/01/2015   Anxiety and depression 09/01/2015   Positive QuantiFERON-TB Gold test 09/01/2015   History of herpes simplex infection 09/01/2015   Past Medical History:  Diagnosis Date   ADHD (attention deficit hyperactivity disorder) 2000   Allergy    Anxiety    Arthritis    Depression    Hypertension    Meniere's disease of left ear    Psoriatic arthritis ([facility])    Dr. Estanislado Pandy   Tuberculosis    Latent TB, started treatment on 08/30/2015    Family History  Problem Relation Age of Onset   Hypertension Mother    Cancer Mother        breast    Arthritis Mother    Depression Mother    Hypertension Father    Hypertension Brother    Stroke Brother    Colon cancer Neg Hx    Esophageal cancer Neg Hx    Rectal cancer Neg Hx    Stomach cancer Neg Hx     Past Surgical History:  Procedure Laterality Date   CESAREAN SECTION     COLONOSCOPY     MOUTH SURGERY  2020   tooth extraction/ bone graft    TOTAL HIP ARTHROPLASTY Right 01/19/2020   Procedure: RIGHT TOTAL HIP ARTHROPLASTY-DIRECT ANTERIOR;  Surgeon: Marybelle Killings, MD;  Location: [facility];  Service: Orthopedics;  Laterality: Right;   TOTAL HIP ARTHROPLASTY Left 12/24/2020   Procedure: LEFT TOTAL HIP ARTHROPLASTY ANTERIOR APPROACH;  Surgeon: Marybelle Killings, MD;  Location: [facility];  Service: Orthopedics;  Laterality: Left;   Social History   Occupational History   Not on file  Tobacco Use   Smoking status: Former    Years: 30.00    Types: Cigarettes    Quit date: 08/2019    Years since quitting: 1.3   Smokeless tobacco: Never  Vaping Use   Vaping Use: Never used  Substance and Sexual Activity   Alcohol use: Yes    Alcohol/week:  2.0 standard drinks    Types: 2 Glasses of wine per week    Comment: "a glass of wine in the evening once in a while"   Drug use: No   Sexual activity: Not Currently    Comment: lives with her mother. 1 child age 36.

## 2021-09-13 ENCOUNTER — Ambulatory Visit: Payer: Commercial Managed Care - HMO | Admitting: Orthopaedic Surgery

## 2021-09-13 ENCOUNTER — Ambulatory Visit (INDEPENDENT_AMBULATORY_CARE_PROVIDER_SITE_OTHER): Payer: Commercial Managed Care - HMO

## 2021-09-13 VITALS — BP 131/86 | HR 109 | Ht 66.0 in | Wt 170.0 lb

## 2021-09-13 DIAGNOSIS — M79671 Pain in right foot: Secondary | ICD-10-CM

## 2021-09-13 NOTE — Progress Notes (Signed)
Office Visit Note   Patient: Valerie Delgado           Date of Birth: 1965/07/08           MRN: [id number] Visit Date: 09/13/2021              Requested by: Glenis Smoker, MD [place],  [place] [address] PCP: Glenis Smoker, MD   Assessment & Plan: Visit Diagnoses:  1. Pain in right foot     Plan: Foot sprain second metatarsal phalangeal joint.  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  If she is having persistent problems in 6 weeks she can return.  X-ray results were reviewed with patient pathophysiology discussed.  Follow-Up Instructions: No follow-ups on file.   Orders:  Orders Placed This Encounter  Procedures   XR Foot Complete Right   No orders of the defined types were placed in this encounter.     Procedures: No procedures performed   Clinical Data: No additional findings.   Subjective: No chief complaint on file.   HPI 56 year old female had previous total hip arthroplasties by me injured her foot reaching for something her shoe got stuck she felt a pop 1 week ago on Friday and had significant increased pain over the right midfoot with dorsal swelling tenderness over the second metatarsal head and tenderness with palpation over the dorsum.  She is elevated and used ice.  Had x-rays and patient was concerned something was missed due to continued pain.  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  Both hips are doing well.  Review of Systems All the systems noncontributory to HPI.  Objective: Vital Signs: BP 131/86   Pulse (!) 109   Ht '5\' 6"'$  (1.676 m)   Wt 170 lb (77.1 kg)   BMI 27.44 kg/m   Physical Exam Constitutional:      Appearance: She is well-developed.  HENT:     Head: Normocephalic.     Right Ear: External ear normal.     Left Ear: External ear normal. There is no impacted cerumen.  Eyes:     Pupils: Pupils are equal, round, and reactive to light.   Neck:     Thyroid: No thyromegaly.     Trachea: No tracheal deviation.  Cardiovascular:     Rate and Rhythm: Normal rate.  Pulmonary:     Effort: Pulmonary effort is normal.  Abdominal:     Palpations: Abdomen is soft.  Musculoskeletal:     Cervical back: No rigidity.  Skin:    General: Skin is warm and dry.  Neurological:     Mental Status: She is alert and oriented to person, place, and time.  Psychiatric:        Behavior: Behavior normal.     Ortho Exam patient has right foot dorsal swelling centered over the second metatarsal neck with point tenderness as well as tenderness over the second metatarsal head over the plantar surface.  No plantar foot lesions.  Good capillary refill.  Opposite left foot she has no swelling and no tenderness.  Specialty Comments:  No specialty comments available.  Imaging: XR Foot Complete Right  Result Date: 09/13/2021 Three-view x-rays right foot obtained and reviewed.  Patient has some pes planus.  No evidence of metatarsal fractures.  No arthritic changes in the foot. Impression: Normal right foot radiographs, negative for acute  bony changes.    PMFS History: Patient Active Problem List   Diagnosis Date Noted   Hx of total hip arthroplasty, left 01/11/2021   Osteoarthritis of hip 12/24/2020   Arthritis of left hip 12/24/2020   Status post total hip replacement, right 01/19/2020   Decreased visual acuity 03/30/2017   Estrogen deficiency 03/30/2017   Inflammatory arthritis 11/13/2016   Essential hypertension 09/19/2016   History of anxiety and depression 09/19/2016   History of Meniere's disease 09/19/2016   History of hearing loss 09/19/2016   History of ADHD 09/19/2016   Dyspareunia, female 01/11/2016   Routine general medical examination at a health care facility 01/11/2016   Smoker 09/01/2015   Anxiety and depression 09/01/2015   Positive QuantiFERON-TB Gold test 09/01/2015   History of herpes simplex infection 09/01/2015    Past Medical History:  Diagnosis Date   ADHD (attention deficit hyperactivity disorder) 2000   Allergy    Anxiety    Arthritis    Depression    Hypertension    Meniere's disease of left ear    Psoriatic arthritis ([facility])    Dr. Estanislado Pandy   Tuberculosis    Latent TB, started treatment on 08/30/2015    Family History  Problem Relation Age of Onset   Hypertension Mother    Cancer Mother        breast   Arthritis Mother    Depression Mother    Hypertension Father    Hypertension Brother    Stroke Brother    Colon cancer Neg Hx    Esophageal cancer Neg Hx    Rectal cancer Neg Hx    Stomach cancer Neg Hx     Past Surgical History:  Procedure Laterality Date   CESAREAN SECTION     COLONOSCOPY     MOUTH SURGERY  2020   tooth extraction/ bone graft    TOTAL HIP ARTHROPLASTY Right 01/19/2020   Procedure: RIGHT TOTAL HIP ARTHROPLASTY-DIRECT ANTERIOR;  Surgeon: Marybelle Killings, MD;  Location: [facility];  Service: Orthopedics;  Laterality: Right;   TOTAL HIP ARTHROPLASTY Left 12/24/2020   Procedure: LEFT TOTAL HIP ARTHROPLASTY ANTERIOR APPROACH;  Surgeon: Marybelle Killings, MD;  Location: [facility];  Service: Orthopedics;  Laterality: Left;   Social History   Occupational History   Not on file  Tobacco Use   Smoking status: Former    Years: 30.00    Types: Cigarettes    Quit date: 08/2019    Years since quitting: 2.0   Smokeless tobacco: Never  Vaping Use   Vaping Use: Never used  Substance and Sexual Activity   Alcohol use: Yes    Alcohol/week: 2.0 standard drinks of alcohol    Types: 2 Glasses of wine per week    Comment: "a glass of wine in the evening once in a while"   Drug use: No   Sexual activity: Not Currently    Comment: lives with her mother. 1 child age 76.

## 2021-10-03 IMAGING — MR MR LUMBAR SPINE W/O CM
4 of 5 series · 20 of 48 positions shown · non-contrast
Comparison: None.

CLINICAL DATA: Worsening low back pain and right greater than left
radiculopathy

EXAM:
MRI LUMBAR SPINE WITHOUT CONTRAST
TECHNIQUE: Multiplanar, multisequence MR imaging of the lumbar spine was
performed. No intravenous contrast was administered.

[Series 5: T2 · sagittal · 4.0mm · 0.73mm/px · 6 of 13 slices shown (1 of 2)]
[im 1/13]
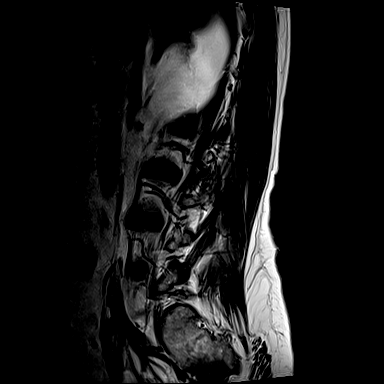
[im 3/13]
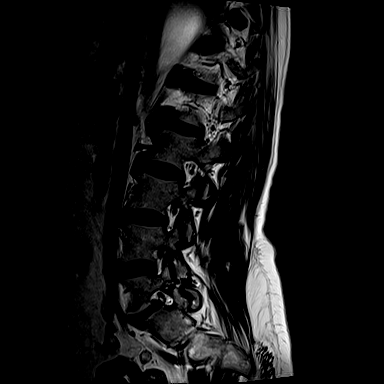
[im 5/13]
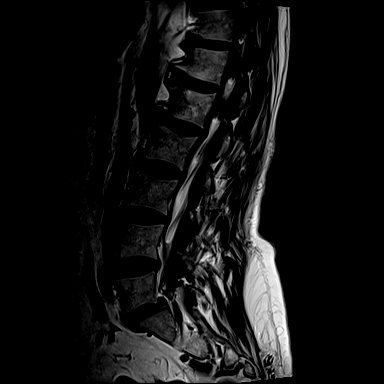
[im 8/13]
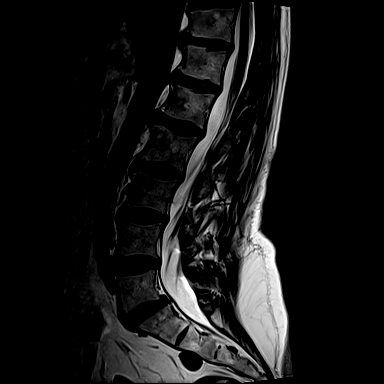
[im 10/13]
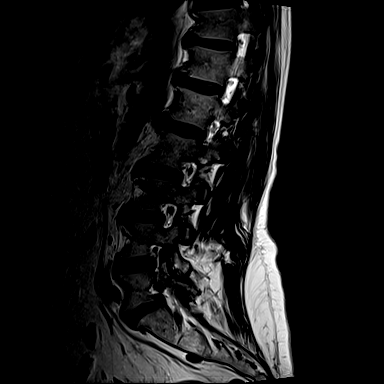
[im 13/13]
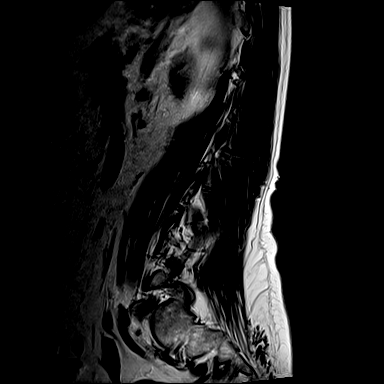

[Series 6: T1 · sagittal · 4.0mm · 0.73mm/px · 3 of 13 slices shown (1 of 2)]
[im 1/13]
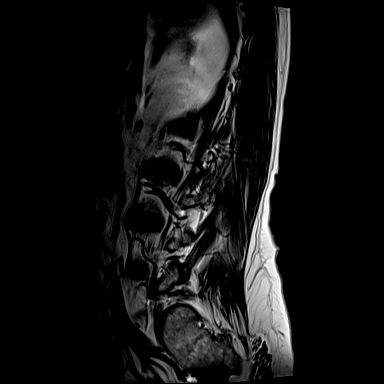
[im 7/13]
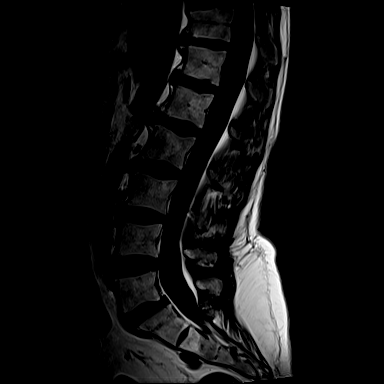
[im 13/13]
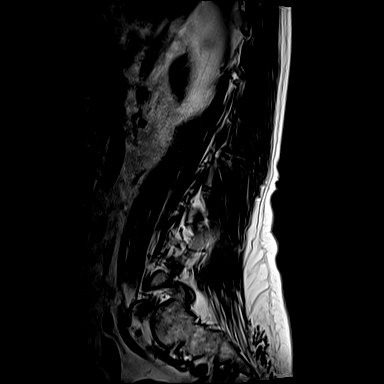

[Series 10: T2 · axial · 4.0mm · 0.35mm/px · z∈[-106,+77]mm · 8 of 39 slices shown (2 of 2)]
[im 3/39]
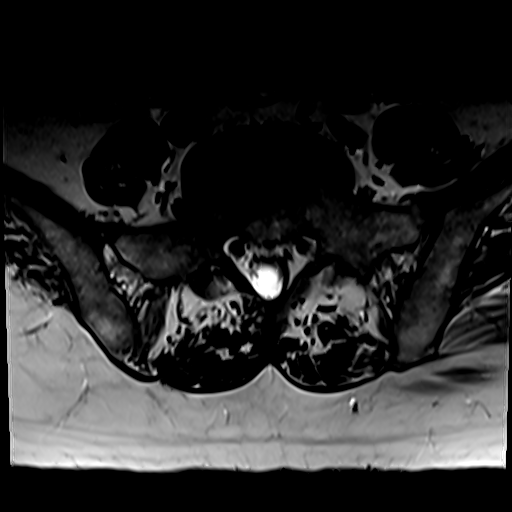
[im 6/39]
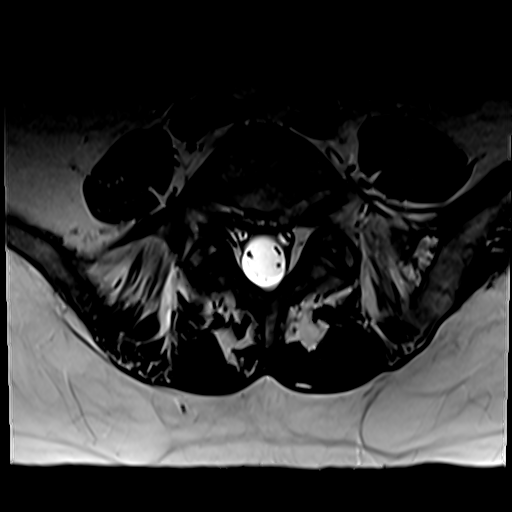
[im 8/39]
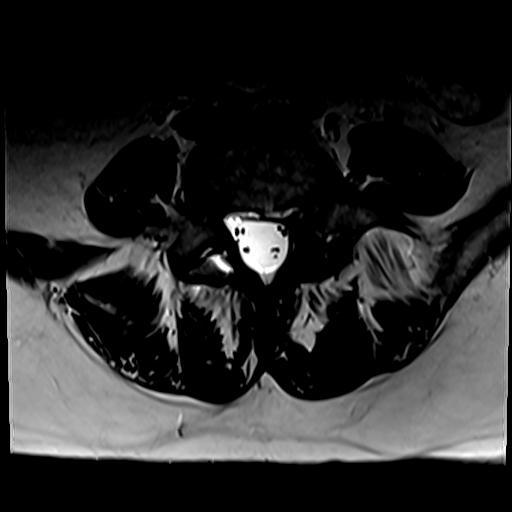
[im 13/39]
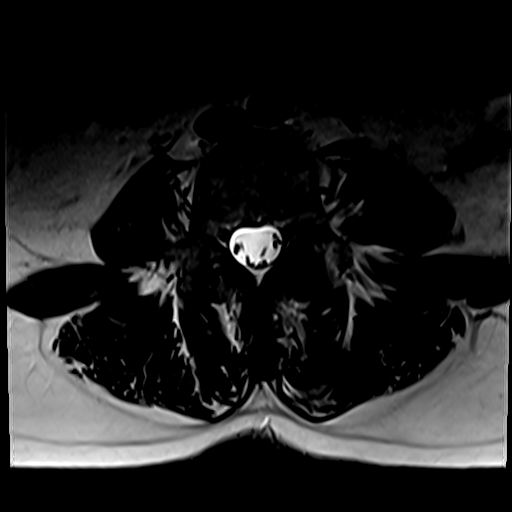
[im 18/39]
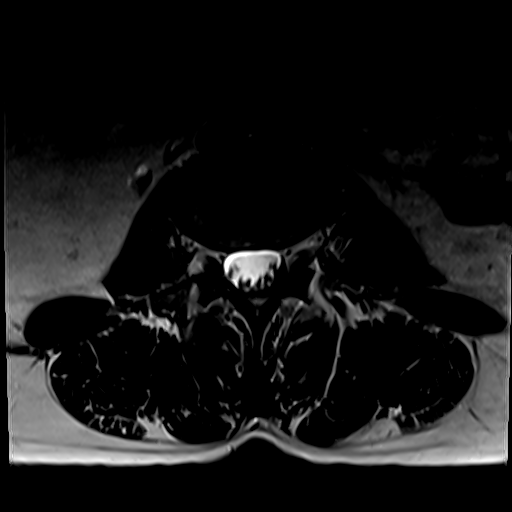
[im 21/39]
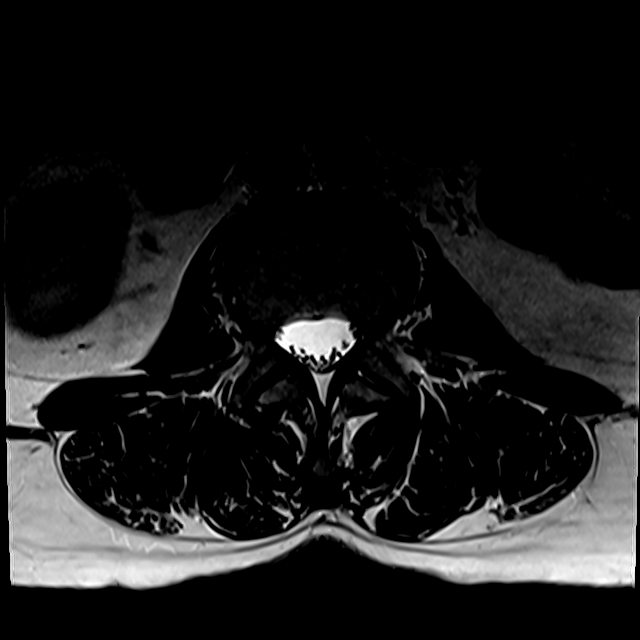
[im 23/39]
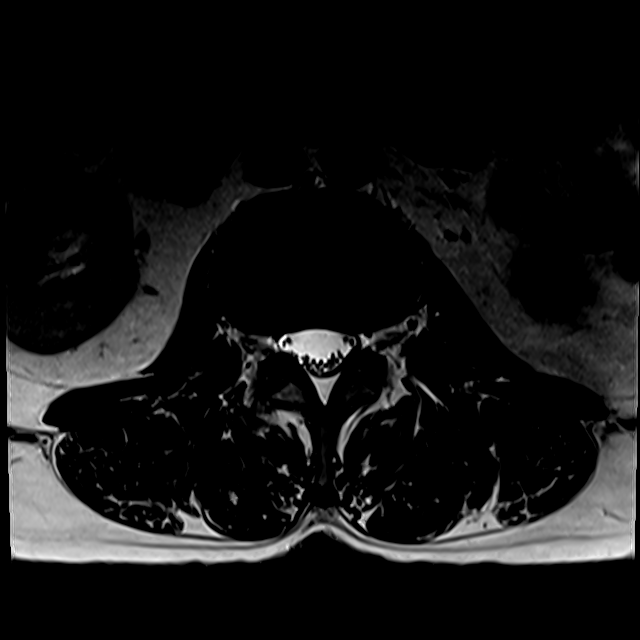
[im 33/39]
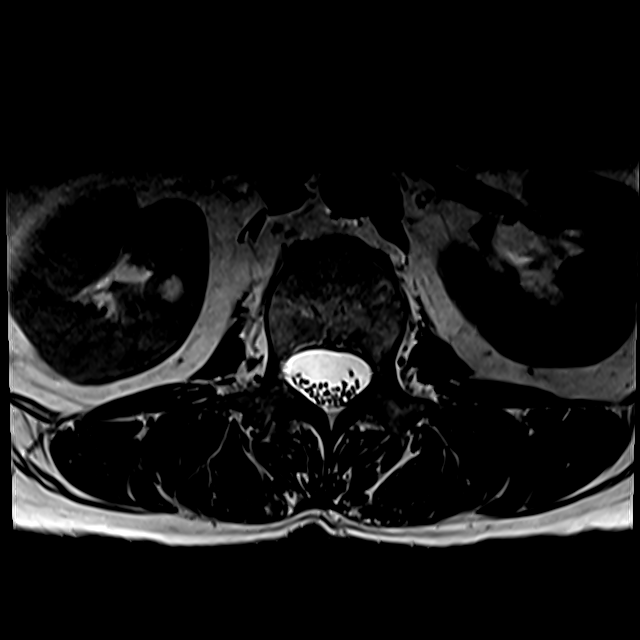

[Series 13: T1 · axial · 4.0mm · 0.35mm/px · z∈[-91,+77]mm · 3 of 39 slices shown (2 of 2)]
[im 6/39]
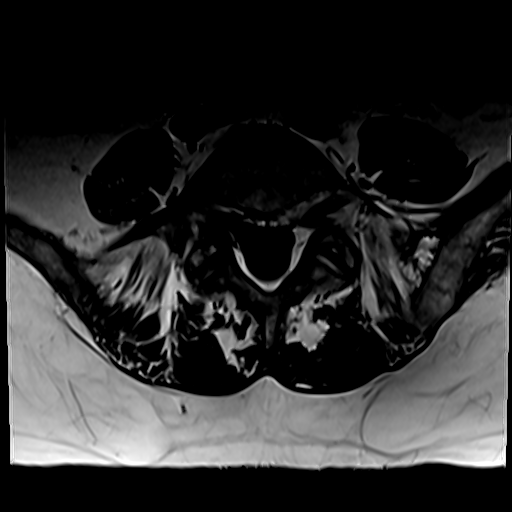
[im 21/39]
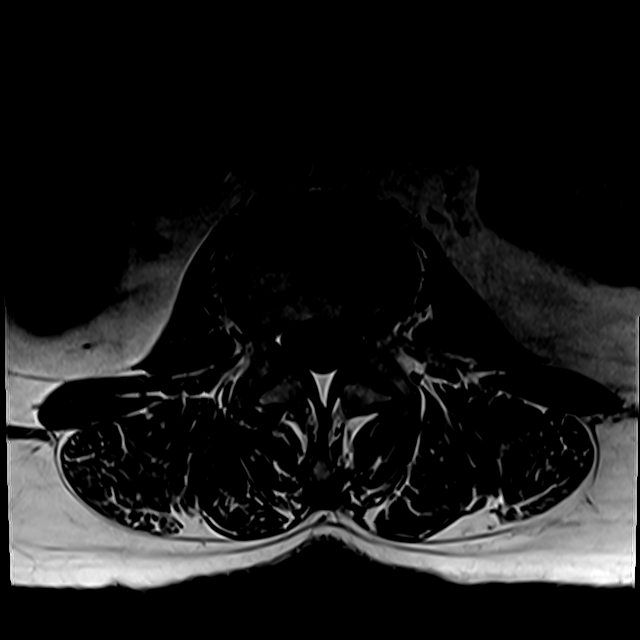
[im 33/39]
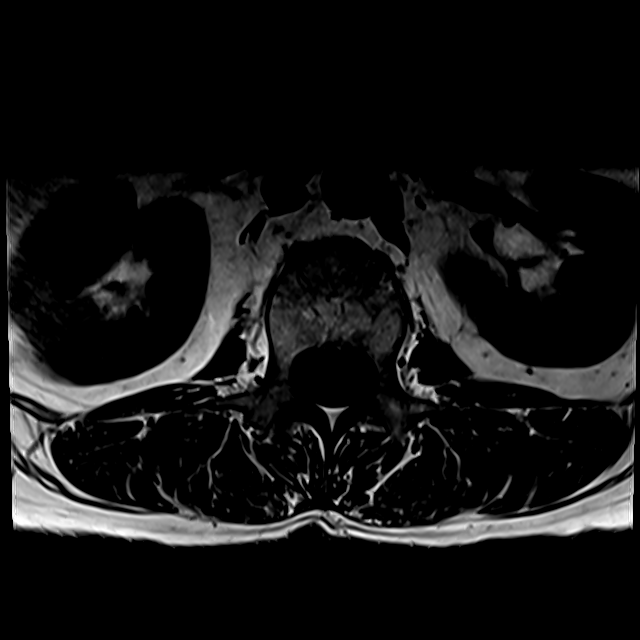

[20 of 48 positions shown; findings below may reference images not displayed]

FINDINGS: Segmentation:  Standard.

Alignment: Grade 1 anterolisthesis at L5-S1 with suspected chronic
bilateral L5 pars defects.

Vertebrae: Vertebral body heights are maintained apart from mild
degenerative endplate irregularity. There is no significant marrow
edema. No suspicious osseous lesion.

Conus medullaris and cauda equina: Conus extends to the L1 level.
Conus and cauda equina appear normal.

Paraspinal and other soft tissues: Unremarkable.

Disc levels:

L1-L2:  Disc bulge.  No canal or foraminal stenosis.

L2-L3:  Disc bulge.  No canal or foraminal stenosis.

L3-L4:  Disc bulge.  No canal or foraminal stenosis.

L4-L5: Disc bulge with mild endplate osteophytic ridging. Facet
arthropathy. No canal or foraminal stenosis.

L5-S1: Anterolisthesis with uncovering of disc bulge. Facet
arthropathy. No canal stenosis. Left greater than right foraminal
stenosis.
IMPRESSION: Left greater than right foraminal stenosis at L5-S1 secondary to
chronic bilateral L5 pars defects with grade 1 anterolisthesis.

## 2021-10-19 ENCOUNTER — Other Ambulatory Visit: Payer: Self-pay | Admitting: Family Medicine

## 2021-10-19 DIAGNOSIS — R922 Inconclusive mammogram: Secondary | ICD-10-CM

## 2021-11-04 ENCOUNTER — Other Ambulatory Visit: Payer: Commercial Managed Care - HMO

## 2021-12-03 ENCOUNTER — Other Ambulatory Visit: Payer: Commercial Managed Care - HMO

## 2021-12-13 ENCOUNTER — Other Ambulatory Visit: Payer: Self-pay | Admitting: Family Medicine

## 2021-12-13 DIAGNOSIS — Z1231 Encounter for screening mammogram for malignant neoplasm of breast: Secondary | ICD-10-CM

## 2021-12-20 ENCOUNTER — Ambulatory Visit
Admission: RE | Admit: 2021-12-20 | Discharge: 2021-12-20 | Disposition: A | Discharge status: Home / Self Care | Payer: Commercial Managed Care - HMO | Source: Ambulatory Visit | Attending: Family Medicine | Admitting: Family Medicine

## 2021-12-20 DIAGNOSIS — Z1231 Encounter for screening mammogram for malignant neoplasm of breast: Secondary | ICD-10-CM

## 2023-01-22 ENCOUNTER — Ambulatory Visit: Payer: BC Managed Care – PPO | Admitting: Orthopedic Surgery

## 2023-01-22 ENCOUNTER — Other Ambulatory Visit (INDEPENDENT_AMBULATORY_CARE_PROVIDER_SITE_OTHER): Payer: Self-pay

## 2023-01-22 DIAGNOSIS — M21611 Bunion of right foot: Secondary | ICD-10-CM

## 2023-01-22 DIAGNOSIS — M21612 Bunion of left foot: Secondary | ICD-10-CM

## 2023-01-22 DIAGNOSIS — M2022 Hallux rigidus, left foot: Secondary | ICD-10-CM | POA: Diagnosis not present

## 2023-01-22 DIAGNOSIS — M2021 Hallux rigidus, right foot: Secondary | ICD-10-CM | POA: Diagnosis not present

## 2023-01-23 ENCOUNTER — Encounter: Payer: Self-pay | Admitting: Orthopedic Surgery

## 2023-01-23 NOTE — Progress Notes (Signed)
Tried leaving a message upstairs and I tried calling upstairs and try to get that got the box to do shockwave treatment  Office Visit Note   Patient: Valerie Delgado           Date of Birth: Apr 03, 1965           MRN: [id number] Visit Date: 01/22/2023              Requested by: Shon Hale, MD [address] [place],  [place] [address] PCP: Shon Hale, MD  Chief Complaint  Patient presents with   Right Foot - Pain    Bilateral bunions   Left Foot - Pain      HPI: Patient is a 57 year old woman who presents with bilateral great toe pain MTP joint.  She states the pain is worse on the left than the right.  She states her pain is secondary to bunions.  Patient has a history of psoriatic arthritis.    Assessment & Plan: Visit Diagnoses:  1. Bilateral bunions   2. Hallux rigidus, bilateral     Plan: Recommended a stiff soled walking sneaker such as a new balance wide walking sneaker and a carbon orthotic this was written down.  I  recommended Achilles stretching.  Feel that most of her pain is from the arthritic changes and hallux rigidus and do not feel bunion surgery would be beneficial.  Follow-Up Instructions: No follow-ups on file.   Ortho Exam  Patient is alert, oriented, no adenopathy, well-dressed, normal affect, normal respiratory effort. Patient is ambulating in cowboy boots with an elevated heel.  She has swelling across the base of the fourth metatarsal bilaterally and radiographs do show arthritic changes base of the fourth metatarsal bilaterally.  Patient has mild bunion deformities without ulcers medially.  She has hallux rigidus bilaterally with dorsiflexion of only 45 degrees.  There is some small cystic changes in the first metatarsal head bilaterally.  She has a good dorsalis pedis pulse bilaterally.  The second metatarsal on the right is not tender to palpation she has a healed stress fracture.  Imaging: No results found. No images are  attached to the encounter.  Labs: Lab Results  Component Value Date   ESRSEDRATE 2 08/18/2020   ESRSEDRATE 6 07/29/2019     Lab Results  Component Value Date   ALBUMIN 3.5 12/17/2020   ALBUMIN 4.3 12/15/2019   ALBUMIN 4.2 03/30/2017    No results found for: "MG" Lab Results  Component Value Date   VD25OH 14 (L) 08/18/2020    No results found for: "PREALBUMIN"    Latest Ref Rng & Units 12/25/2020    5:07 AM 12/17/2020    9:00 AM 08/18/2020    8:40 AM  CBC EXTENDED  WBC 4.0 - 10.5 K/uL 16.1  6.6  7.8   RBC 3.87 - 5.11 MIL/uL 3.32  4.32  4.74   Hemoglobin 12.0 - 15.0 g/dL 47.4  25.9  56.3   HCT 36.0 - 46.0 % 31.0  41.1  44.4   Platelets 150 - 400 K/uL 289  280  344   NEUT# 1,500 - 7,800 cells/uL   4,064   Lymph# 850 - 3,900 cells/uL   2,808      There is no height or weight on file to calculate BMI.  Orders:  Orders Placed This Encounter  Procedures   XR Foot Complete Right   XR Foot Complete Left   No orders of the defined types were placed in  this encounter.    Procedures: No procedures performed  Clinical Data: No additional findings.  ROS:  All other systems negative, except as noted in the HPI. Review of Systems  Objective: Vital Signs: There were no vitals taken for this visit.  Specialty Comments:  No specialty comments available.  PMFS History: Patient Active Problem List   Diagnosis Date Noted   Hx of total hip arthroplasty, left 01/11/2021   Osteoarthritis of hip 12/24/2020   Arthritis of left hip 12/24/2020   Status post total hip replacement, right 01/19/2020   Decreased visual acuity 03/30/2017   Estrogen deficiency 03/30/2017   Inflammatory arthritis 11/13/2016   Essential hypertension 09/19/2016   History of anxiety and depression 09/19/2016   History of Meniere's disease 09/19/2016   History of hearing loss 09/19/2016   History of ADHD 09/19/2016   Dyspareunia, female 01/11/2016   Routine general medical examination at a  health care facility 01/11/2016   Smoker 09/01/2015   Anxiety and depression 09/01/2015   Positive QuantiFERON-TB Gold test 09/01/2015   History of herpes simplex infection 09/01/2015   Past Medical History:  Diagnosis Date   ADHD (attention deficit hyperactivity disorder) 2000   Allergy    Anxiety    Arthritis    Depression    Hypertension    Meniere's disease of left ear    Psoriatic arthritis ([facility])    Dr. Corliss Skains   Tuberculosis    Latent TB, started treatment on 08/30/2015    Family History  Problem Relation Age of Onset   Breast cancer Mother    Hypertension Mother    Cancer Mother        breast   Arthritis Mother    Depression Mother    Hypertension Father    Breast cancer Maternal Aunt    Breast cancer Maternal Aunt    Breast cancer Maternal Aunt    Breast cancer Maternal Aunt    Hypertension Brother    Stroke Brother    Colon cancer Neg Hx    Esophageal cancer Neg Hx    Rectal cancer Neg Hx    Stomach cancer Neg Hx     Past Surgical History:  Procedure Laterality Date   CESAREAN SECTION     COLONOSCOPY     MOUTH SURGERY  2020   tooth extraction/ bone graft    TOTAL HIP ARTHROPLASTY Right 01/19/2020   Procedure: RIGHT TOTAL HIP ARTHROPLASTY-DIRECT ANTERIOR;  Surgeon: Eldred Manges, MD;  Location[facility];  Service: Orthopedics;  Laterality: Right;   TOTAL HIP ARTHROPLASTY Left 12/24/2020   Procedure: LEFT TOTAL HIP ARTHROPLASTY ANTERIOR APPROACH;  Surgeon: Eldred Manges, MD;  Location[facility];  Service: Orthopedics;  Laterality: Left;   Social History   Occupational History   Not on file  Tobacco Use   Smoking status: Former    Current packs/day: 0.00    Types: Cigarettes    Start date: 08/1989    Quit date: 08/2019    Years since quitting: 3.4   Smokeless tobacco: Never  Vaping Use   Vaping status: Never Used  Substance and Sexual Activity   Alcohol use: Yes    Alcohol/week: 2.0 standard drinks of alcohol    Types: 2 Glasses of wine per week     Comment: "a glass of wine in the evening once in a while"   Drug use: No   Sexual activity: Not Currently    Comment: lives with her mother. 1 child age 31.

## 2023-04-13 DIAGNOSIS — R059 Cough, unspecified: Secondary | ICD-10-CM | POA: Diagnosis not present

## 2023-04-13 DIAGNOSIS — J019 Acute sinusitis, unspecified: Secondary | ICD-10-CM | POA: Diagnosis not present

## 2023-04-19 DIAGNOSIS — F3342 Major depressive disorder, recurrent, in full remission: Secondary | ICD-10-CM | POA: Diagnosis not present

## 2023-04-19 DIAGNOSIS — F410 Panic disorder [episodic paroxysmal anxiety] without agoraphobia: Secondary | ICD-10-CM | POA: Diagnosis not present

## 2023-04-19 DIAGNOSIS — F900 Attention-deficit hyperactivity disorder, predominantly inattentive type: Secondary | ICD-10-CM | POA: Diagnosis not present

## 2023-04-23 DIAGNOSIS — H8102 Meniere's disease, left ear: Secondary | ICD-10-CM | POA: Diagnosis not present

## 2023-04-23 DIAGNOSIS — H9190 Unspecified hearing loss, unspecified ear: Secondary | ICD-10-CM | POA: Diagnosis not present

## 2023-04-23 DIAGNOSIS — J3489 Other specified disorders of nose and nasal sinuses: Secondary | ICD-10-CM | POA: Diagnosis not present

## 2023-04-26 DIAGNOSIS — H90A32 Mixed conductive and sensorineural hearing loss, unilateral, left ear with restricted hearing on the contralateral side: Secondary | ICD-10-CM | POA: Diagnosis not present

## 2023-04-26 DIAGNOSIS — R42 Dizziness and giddiness: Secondary | ICD-10-CM | POA: Diagnosis not present

## 2023-04-26 DIAGNOSIS — H90A22 Sensorineural hearing loss, unilateral, left ear, with restricted hearing on the contralateral side: Secondary | ICD-10-CM | POA: Diagnosis not present

## 2023-04-26 DIAGNOSIS — H8102 Meniere's disease, left ear: Secondary | ICD-10-CM | POA: Diagnosis not present

## 2023-04-26 DIAGNOSIS — H90A21 Sensorineural hearing loss, unilateral, right ear, with restricted hearing on the contralateral side: Secondary | ICD-10-CM | POA: Diagnosis not present

## 2023-05-03 DIAGNOSIS — H8102 Meniere's disease, left ear: Secondary | ICD-10-CM | POA: Diagnosis not present

## 2023-05-10 DIAGNOSIS — H8102 Meniere's disease, left ear: Secondary | ICD-10-CM | POA: Diagnosis not present

## 2023-05-15 DIAGNOSIS — H8109 Meniere's disease, unspecified ear: Secondary | ICD-10-CM | POA: Diagnosis not present

## 2023-05-15 DIAGNOSIS — I10 Essential (primary) hypertension: Secondary | ICD-10-CM | POA: Diagnosis not present

## 2023-08-20 DIAGNOSIS — N644 Mastodynia: Secondary | ICD-10-CM | POA: Diagnosis not present

## 2023-08-20 DIAGNOSIS — Z122 Encounter for screening for malignant neoplasm of respiratory organs: Secondary | ICD-10-CM | POA: Diagnosis not present

## 2023-08-23 DIAGNOSIS — L570 Actinic keratosis: Secondary | ICD-10-CM | POA: Diagnosis not present

## 2023-08-23 DIAGNOSIS — D485 Neoplasm of uncertain behavior of skin: Secondary | ICD-10-CM | POA: Diagnosis not present

## 2023-08-23 DIAGNOSIS — Z129 Encounter for screening for malignant neoplasm, site unspecified: Secondary | ICD-10-CM | POA: Diagnosis not present

## 2023-08-23 DIAGNOSIS — D225 Melanocytic nevi of trunk: Secondary | ICD-10-CM | POA: Diagnosis not present

## 2023-08-23 DIAGNOSIS — L821 Other seborrheic keratosis: Secondary | ICD-10-CM | POA: Diagnosis not present

## 2023-08-23 DIAGNOSIS — L700 Acne vulgaris: Secondary | ICD-10-CM | POA: Diagnosis not present

## 2023-08-29 ENCOUNTER — Other Ambulatory Visit: Payer: Self-pay | Admitting: Family Medicine

## 2023-08-29 DIAGNOSIS — Z122 Encounter for screening for malignant neoplasm of respiratory organs: Secondary | ICD-10-CM

## 2023-09-03 ENCOUNTER — Other Ambulatory Visit: Payer: Self-pay | Admitting: Family Medicine

## 2023-09-03 DIAGNOSIS — N644 Mastodynia: Secondary | ICD-10-CM

## 2023-09-11 ENCOUNTER — Encounter

## 2023-09-11 ENCOUNTER — Other Ambulatory Visit

## 2023-09-21 ENCOUNTER — Ambulatory Visit
Admission: RE | Admit: 2023-09-21 | Discharge: 2023-09-21 | Disposition: A | Discharge status: Home / Self Care | Source: Ambulatory Visit | Attending: Family Medicine | Admitting: Family Medicine

## 2023-09-21 DIAGNOSIS — F17210 Nicotine dependence, cigarettes, uncomplicated: Secondary | ICD-10-CM | POA: Diagnosis not present

## 2023-09-21 DIAGNOSIS — Z122 Encounter for screening for malignant neoplasm of respiratory organs: Secondary | ICD-10-CM

## 2023-10-04 DIAGNOSIS — F900 Attention-deficit hyperactivity disorder, predominantly inattentive type: Secondary | ICD-10-CM | POA: Diagnosis not present

## 2023-10-04 DIAGNOSIS — F410 Panic disorder [episodic paroxysmal anxiety] without agoraphobia: Secondary | ICD-10-CM | POA: Diagnosis not present

## 2023-10-04 DIAGNOSIS — F331 Major depressive disorder, recurrent, moderate: Secondary | ICD-10-CM | POA: Diagnosis not present

## 2023-10-24 DIAGNOSIS — N6311 Unspecified lump in the right breast, upper outer quadrant: Secondary | ICD-10-CM | POA: Diagnosis not present

## 2023-10-24 DIAGNOSIS — R92333 Mammographic heterogeneous density, bilateral breasts: Secondary | ICD-10-CM | POA: Diagnosis not present

## 2023-10-24 DIAGNOSIS — R928 Other abnormal and inconclusive findings on diagnostic imaging of breast: Secondary | ICD-10-CM | POA: Diagnosis not present

## 2023-10-24 DIAGNOSIS — Z803 Family history of malignant neoplasm of breast: Secondary | ICD-10-CM | POA: Diagnosis not present

## 2023-11-01 DIAGNOSIS — R7301 Impaired fasting glucose: Secondary | ICD-10-CM | POA: Diagnosis not present

## 2023-11-01 DIAGNOSIS — Z720 Tobacco use: Secondary | ICD-10-CM | POA: Diagnosis not present

## 2023-11-01 DIAGNOSIS — Z23 Encounter for immunization: Secondary | ICD-10-CM | POA: Diagnosis not present

## 2023-11-01 DIAGNOSIS — Z0000 Encounter for general adult medical examination without abnormal findings: Secondary | ICD-10-CM | POA: Diagnosis not present

## 2023-11-01 DIAGNOSIS — I10 Essential (primary) hypertension: Secondary | ICD-10-CM | POA: Diagnosis not present

## 2023-11-01 DIAGNOSIS — E663 Overweight: Secondary | ICD-10-CM | POA: Diagnosis not present

## 2023-11-01 DIAGNOSIS — E7800 Pure hypercholesterolemia, unspecified: Secondary | ICD-10-CM | POA: Diagnosis not present

## 2023-12-24 ENCOUNTER — Encounter: Payer: Self-pay | Admitting: Radiology
